# Patient Record
Sex: Female | Born: 1935 | Race: White | Hispanic: No | Smoking: Never smoker
Health system: Southern US, Community
[De-identification: ages and names within clinical notes are randomized; demographics above are authoritative.]

## PROBLEM LIST (undated history)

## (undated) DIAGNOSIS — I4891 Unspecified atrial fibrillation: Secondary | ICD-10-CM

## (undated) DIAGNOSIS — E079 Disorder of thyroid, unspecified: Secondary | ICD-10-CM

## (undated) DIAGNOSIS — E785 Hyperlipidemia, unspecified: Secondary | ICD-10-CM

## (undated) DIAGNOSIS — R1013 Epigastric pain: Secondary | ICD-10-CM

## (undated) HISTORY — PX: JOINT REPLACEMENT: SHX530

## (undated) HISTORY — PX: BREAST SURGERY: SHX581

## (undated) HISTORY — DX: Unspecified atrial fibrillation: I48.91

## (undated) HISTORY — DX: Disorder of thyroid, unspecified: E07.9

## (undated) HISTORY — PX: SPINE SURGERY: SHX786

## (undated) HISTORY — DX: Hyperlipidemia, unspecified: E78.5

## (undated) HISTORY — PX: ABDOMINAL HYSTERECTOMY: SHX81

---

## 2011-03-29 ENCOUNTER — Inpatient Hospital Stay
Admit: 2011-03-29 | Discharge: 2011-03-29 | Disposition: A | Discharge status: Home / Self Care | Attending: Emergency Medicine

## 2011-03-29 LAB — STREP SCREEN GROUP A THROAT: Rapid Strep A Screen: NEGATIVE

## 2011-03-29 MED ORDER — HYDROCODONE-ACETAMINOPHEN 5-325 MG PO TABS
5-325 MG | ORAL_TABLET | Freq: Four times a day (QID) | ORAL | Status: AC | PRN
Start: 2011-03-29 — End: 2011-04-05

## 2011-03-29 MED ORDER — AZITHROMYCIN 250 MG PO TABS
250 MG | PACK | ORAL | Status: AC
Start: 2011-03-29 — End: 2011-04-08

## 2011-03-29 MED ADMIN — HYDROcodone-acetaminophen (NORCO) 5-325 MG per tablet 1 tablet: 1 | ORAL | @ 17:00:00 | NDC 00406036562

## 2011-03-29 MED FILL — HYDROCODONE-ACETAMINOPHEN 5-325 MG PO TABS: 5-325 MG | ORAL | Qty: 1

## 2011-03-29 NOTE — Discharge Instructions (Signed)
Sore Throat  Sore throats may be caused by bacteria and viruses. They may also be caused by:   Smoking.    Pollution.    Allergies.   If a sore throat is due to Strep infection (a bacterial infection), you may need:   A throat swab and/or    A culture test to verify the strep infection.   You will need one of these:   An antibiotic shot.    Oral medicine for a full 10 days to cure it.   Strep infection is very contagious. A doctor should check any close contacts who have a sore throat or fever. A sore throat caused by a virus infection will usually last only 3-4 days. Antibiotics will not treat a viral sore throat.   Infectious mononucleosis (a viral disease), however, can cause a sore throat that last for up to 3 weeks. Mononucleosis can be diagnosed with blood tests. You must have been sick for at least 1 week for the test to give accurate results.  HOME CARE INSTRUCTIONS   To treat a sore throat, take mild pain medicine.    Increase your fluids.    Eat a soft diet.    Do not smoke.    Gargling with warm water or salt water (1 tsp. salt in 8 oz. water) can be helpful.    Try throat sprays or lozenges or sucking on hard candy will also ease the symptoms.   Call your doctor if your sore throat lasts longer than 1 week.   SEEK IMMEDIATE MEDICAL CARE IF YOU HAVE:    Breathing difficulty.    Increased swelling in the throat.    Pain so severe you are unable to swallow fluids or your saliva.    A severe headache, high fever, vomiting, or red rash.   Document Released: 05/20/2004 Document Re-Released: 07/09/2008  [employer] Patient Information 2012 [employer].

## 2011-03-29 NOTE — ED Notes (Signed)
Ambulatory to exit gait steady without assistance.  Discharged to home. Follow up instructions and home going medications reviewed, with patient . Patient verbalizes understanding.  Discussed driving restrictions, while taking narcotics. Patient verbalizes understanding.     Bertram Millard, RN  03/29/11 1153

## 2011-03-29 NOTE — ED Provider Notes (Addendum)
HPI:  03/29/2011,   Time: 11:34 AM         [name] is a 77 y.o. female presenting to the ED for Sore throat, beginning 2 days ago.  The complaint has been persistent, moderate in severity, and worsened by nothing.  Patient has been visiting her family From [place]. For the last 2 days been having some sore throat, cough, ear fullness,. She has no fever no chills no neck pain. She denies any shortness of breath palpitations or dizziness. Patient states the sore throat seems to be persistent that it makes it worse when she swallows. She denies any sick contacts. Patient is able to tolerate by mouth slight discomfort with swallowing. No change in bladder Or bowel habits.    ROS:   Unless otherwise stated in this report or unable to obtain because of the patient's clinical or mental status as evidenced by the medical record, this patients's positive and negative responses for Review of Systems, constitutional, psych, eyes, ENT, cardiovascular, respiratory, gastrointestinal, neurological, genitourinary, musculoskeletal, integument systems and systems related to the presenting problem are either stated in the preceding or were not pertinent or were negative for the symptoms and/or complaints related to the medical problem.      --------------------------------------------- PAST HISTORY ---------------------------------------------  Past Medical History:  has a past medical history of Hyperlipidemia; Thyroid disease; Asthma; and COPD (chronic obstructive pulmonary disease).    Past Surgical History:  has past surgical history that includes back surgery; Breast surgery; and joint replacement.    Social History:  reports that she has quit smoking. She does not have any smokeless tobacco history on file. She reports that she does not drink alcohol or use illicit drugs.    Family History: family history is not on file.     The patient's home medications have been reviewed.    Allergies: Aspirin; Eggs or egg-derived  products; and Sulfa drugs    -------------------------------------------------- RESULTS -------------------------------------------------  All laboratory and radiology results have been personally reviewed by myself   LABS:  Results for orders placed during the hospital encounter of 03/29/11   STREP SCREEN GROUP A THROAT       Component Value Range    Rapid Strep A Screen NEGATIVE         RADIOLOGY:  Interpreted by Radiologist.       ------------------------- NURSING NOTES AND VITALS REVIEWED ---------------------------   The nursing notes within the ED encounter and vital signs as below have been reviewed.   BP 116/60  Pulse 95  Temp(Src) 98.6 F (37 C) (Oral)  Resp 14  Ht 5\' 4"  (1.626 m)  Wt 180 lb (81.647 kg)  BMI 30.90 kg/m2  SpO2 97%  Oxygen Saturation Interpretation: Normal      ---------------------------------------------------PHYSICAL EXAM--------------------------------------      Constitutional/General: Alert and oriented x3, well appearing, non toxic in NAD  Head: NC/AT  Eyes: PERRL, EOMI  Mouth: Oropharynx With injection, handling secretions, no trismus.  No tonsil adenopathy or exudates.  Neck: Supple, full ROM, no meningeal signs. Mild anterior lymphadenopathy.  Pulmonary: Lungs clear to auscultation bilaterally, no wheezes, rales, or rhonchi. Not in respiratory distress  Cardiovascular:  Regular rate and rhythm, no murmurs, gallops, or rubs. 2+ distal pulses  Abdomen: Soft, non tender, non distended,   Extremities: Moves all extremities x 4. Warm and well perfused  Skin: warm and dry without rash  Neurologic: GCS 15,  Psych: Normal Affect      ------------------------------ ED COURSE/MEDICAL DECISION MAKING----------------------  Medications   simvastatin (ZOCOR) 20 MG tablet (not administered)   levothyroxine (SYNTHROID) 50 MCG tablet (not administered)   citalopram (CELEXA) 20 MG tablet (not administered)   montelukast (SINGULAIR) 10 MG tablet (not administered)   raloxifene (EVISTA) 60  MG tablet (not administered)   pregabalin (LYRICA) 150 MG capsule (not administered)   digoxin (LANOXIN) 0.125 MG tablet (not administered)   dabigatran (PRADAXA) 150 MG capsule (not administered)   ipratropium-albuterol (DUONEB) 0.5-2.5 (3) MG/3ML SOLN nebulizer solution (not administered)   omeprazole (PRILOSEC) 20 MG capsule (not administered)   azithromycin (ZITHROMAX) 250 MG tablet (not administered)   HYDROcodone-acetaminophen (NORCO) 5-325 MG per tablet (not administered)   HYDROcodone-acetaminophen (NORCO) 5-325 MG per tablet 1 tablet (not administered)         Medical Decision Making:    Patient presents with nonspecific throat pain may be secondary to laryngitis or pharyngitis. The patient has had some intermittent cough that may be secondary to reactive airway disease. Chest no signs and symptoms for possible respiratory distress. Patient discharged home with antibiotics and pain control for sore throat.    Counseling:   The emergency provider has spoken with the patient and discussed today's results, in addition to providing specific details for the plan of care and counseling regarding the diagnosis and prognosis.  Questions are answered at this time and they are agreeable with the plan.      --------------------------------- IMPRESSION AND DISPOSITION ---------------------------------    IMPRESSION  1. Acute pharyngitis        DISPOSITION  Disposition: Discharge to home  Patient condition is good      Shelbie Hutching, MD  03/29/11 1141    Shelbie Hutching, MD  03/29/11 1723

## 2013-12-18 ENCOUNTER — Encounter: Payer: Self-pay | Admitting: *Deleted

## 2013-12-19 ENCOUNTER — Encounter: Payer: Self-pay | Admitting: Adult Health

## 2013-12-19 ENCOUNTER — Non-Acute Institutional Stay (SKILLED_NURSING_FACILITY): Payer: Medicare Other | Admitting: Adult Health

## 2013-12-19 DIAGNOSIS — R531 Weakness: Secondary | ICD-10-CM

## 2013-12-19 DIAGNOSIS — F329 Major depressive disorder, single episode, unspecified: Secondary | ICD-10-CM

## 2013-12-19 DIAGNOSIS — F3289 Other specified depressive episodes: Secondary | ICD-10-CM

## 2013-12-19 DIAGNOSIS — E039 Hypothyroidism, unspecified: Secondary | ICD-10-CM | POA: Insufficient documentation

## 2013-12-19 DIAGNOSIS — J309 Allergic rhinitis, unspecified: Secondary | ICD-10-CM

## 2013-12-19 DIAGNOSIS — E785 Hyperlipidemia, unspecified: Secondary | ICD-10-CM

## 2013-12-19 DIAGNOSIS — G629 Polyneuropathy, unspecified: Secondary | ICD-10-CM

## 2013-12-19 DIAGNOSIS — I4891 Unspecified atrial fibrillation: Secondary | ICD-10-CM | POA: Insufficient documentation

## 2013-12-19 DIAGNOSIS — R5383 Other fatigue: Secondary | ICD-10-CM

## 2013-12-19 DIAGNOSIS — R5381 Other malaise: Secondary | ICD-10-CM

## 2013-12-19 DIAGNOSIS — F32A Depression, unspecified: Secondary | ICD-10-CM | POA: Insufficient documentation

## 2013-12-19 DIAGNOSIS — G589 Mononeuropathy, unspecified: Secondary | ICD-10-CM

## 2013-12-19 NOTE — Progress Notes (Signed)
Patient ID: Emily Kaiser, female   DOB: 22-Sep-1927, 80 y.o.   MRN: [id number]               PROGRESS NOTE  DATE: 12/19/2013  FACILITY: Nursing Home Location: [facility]  LEVEL OF CARE: SNF (31)  Acute Visit  CHIEF COMPLAINT:  Follow-up Hospitalization  HISTORY OF PRESENT ILLNESS: This is an 80 year old female who has been admitted to [facility] on 12/18/13 from [facility] with Generalized Weakness. She has been admitted for a short-term rehabilitation.  REASSESSMENT OF ONGOING PROBLEM(S):  ATRIAL FIBRILLATION: the patients atrial fibrillation remains stable.  The patient denies DOE, tachycardia, orthopnea, transient neurological sx, pedal edema, palpitations, & PNDs.  No complications noted from the medications currently being used.  DEPRESSION: The depression remains stable. Patient denies ongoing feelings of sadness, insomnia, anedhonia or lack of appetite. No complications reported from the medications currently being used. Staff do not report behavioral problems.  HYPOTHYROIDISM: The hypothyroidism remains stable. No complications noted from the medications presently being used.  The patient denies fatigue or constipation.  8/15 TSH 2.666   PAST MEDICAL HISTORY : Reviewed.  No changes/see problem list  CURRENT MEDICATIONS: Reviewed per MAR/see medication list  REVIEW OF SYSTEMS:  GENERAL: no change in appetite, no fatigue, no weight changes, no fever, chills or weakness RESPIRATORY: no cough, SOB, DOE, wheezing, hemoptysis CARDIAC: no chest pain, edema or palpitations GI: no abdominal pain, diarrhea, constipation, heart burn, nausea or vomiting  PHYSICAL EXAMINATION  GENERAL: no acute distress, normal body habitus EYES: conjunctivae normal, sclerae normal, normal eye lids NECK: supple, trachea midline, no neck masses, no thyroid tenderness, no thyromegaly LYMPHATICS: no LAN in the neck, no supraclavicular LAN RESPIRATORY: breathing  is even & unlabored, BS CTAB CARDIAC: Irregular in rhythm and regular in rate, no murmur,no extra heart sounds, no edema GI: abdomen soft, normal BS, no masses, no tenderness, no hepatomegaly, no splenomegaly EXTREMITIES:  Able to move X4 extremities PSYCHIATRIC: the patient is alert & oriented to person, affect & behavior appropriate  LABS/RADIOLOGY: 12/14/13  sodium 139 potassium 4.4 glucose 111 BUN 13 creatinine 1.04 calcium 9.8 albumin 3.7 SGOT 51 SGPT 37 alkaline phosphatase 79 total bilirubin 1.1 anion gap 14.0 TSH 2.666 WBC 4.9 hemoglobin 15.6 hematocrit 44.7 MCV 93.7 MRI-brain with and without contrast - atrophy and chronic ischemia in the white matter and pons. No acute abnormality   ASSESSMENT/PLAN:  Generalized weakness - for rehabitation Atrial fibrillation - rate controlled; continue pradaxa and digoxin Hyperlipidemia - continue simvastatin Neuropathy - stable; continue Lyrica Depression - continue Celexa Allergic rhinitis - continue fexofenadine and Singulair Hypothyroidism - stable; continue Synthroid   CPT CODE: 65784  Ella Bodo - NP [facility] [phone]

## 2013-12-20 ENCOUNTER — Non-Acute Institutional Stay (SKILLED_NURSING_FACILITY): Payer: Medicare Other | Admitting: Internal Medicine

## 2013-12-20 DIAGNOSIS — F329 Major depressive disorder, single episode, unspecified: Secondary | ICD-10-CM

## 2013-12-20 DIAGNOSIS — G629 Polyneuropathy, unspecified: Secondary | ICD-10-CM

## 2013-12-20 DIAGNOSIS — G589 Mononeuropathy, unspecified: Secondary | ICD-10-CM

## 2013-12-20 DIAGNOSIS — F32A Depression, unspecified: Secondary | ICD-10-CM

## 2013-12-20 DIAGNOSIS — I4891 Unspecified atrial fibrillation: Secondary | ICD-10-CM

## 2013-12-20 DIAGNOSIS — F3289 Other specified depressive episodes: Secondary | ICD-10-CM

## 2013-12-20 DIAGNOSIS — E039 Hypothyroidism, unspecified: Secondary | ICD-10-CM

## 2013-12-22 NOTE — Progress Notes (Signed)
        HISTORY & PHYSICAL  DATE: 12/20/2013   FACILITY: [facility]  LEVEL OF CARE: SNF (31)  ALLERGIES:  Allergies  Allergen Reactions  . Asa [Aspirin]   . Eggs Or Egg-Derived Products   . Sulfa Antibiotics     CHIEF COMPLAINT:  Manage atrial fibrillation, hypothyroidism & peripheral neuropathy  HISTORY OF PRESENT ILLNESS: 80 y/o Caucasian female was hospitalized secondary to generalized weakness.  After hospitalization she is admitted to this facility for short term rehabilitation.  ATRIAL FIBRILLATION: the patients atrial fibrillation remains stable.  The patient denies DOE, tachycardia, orthopnea, transient neurological sx, pedal edema, palpitations, & PNDs.  No complications noted from the medications currently being used.  HYPOTHYROIDISM: The hypothyroidism remains stable. No complications noted from the medications presently being used.  The patient denies fatigue or constipation.  Last TSH 2.666 in 8-15.  PERIPHERAL NEUROPATHY: The peripheral neuropathy is stable. The patient denies pain in the feet, tingling, and numbness. No complications noted from the medication presently being used.  PAST MEDICAL HISTORY :  Past Medical History  Diagnosis Date  . Thyroid disease     Hypothyroidism  . Atrial fibrillation   . Dyslipidemia     PAST SURGICAL HISTORY: Past Surgical History  Procedure Laterality Date  . Abdominal hysterectomy    . Spine surgery    . Joint replacement    . Breast surgery      SOCIAL HISTORY:  reports that she has never smoked. She does not have any smokeless tobacco history on file. She reports that she does not drink alcohol or use illicit drugs.  FAMILY HISTORY: none  CURRENT MEDICATIONS: Reviewed per MAR/see medication list  REVIEW OF SYSTEMS:  See HPI otherwise 14 point ROS is negative.  PHYSICAL EXAMINATION  VS:  See VS section  GENERAL: no acute distress, moderately obese body habitus EYES: conjunctivae  normal, sclerae normal, normal eye lids MOUTH/THROAT: lips without lesions,no lesions in the mouth,tongue is without lesions,uvula elevates in midline NECK: supple, trachea midline, no neck masses, no thyroid tenderness, no thyromegaly LYMPHATICS: no LAN in the neck, no supraclavicular LAN RESPIRATORY: breathing is even & unlabored, BS CTAB CARDIAC: heart rate is irregularly irregular, no murmur,no extra heart sounds, no edema GI:  ABDOMEN: abdomen soft, normal BS, no masses, no tenderness  LIVER/SPLEEN: no hepatomegaly, no splenomegaly MUSCULOSKELETAL: HEAD: normal to inspection  EXTREMITIES: LEFT UPPER EXTREMITY: full range of motion, normal strength & tone RIGHT UPPER EXTREMITY:  full range of motion, normal strength & tone LEFT LOWER EXTREMITY: moderate range of motion, normal strength & tone RIGHT LOWER EXTREMITY: moderate range of motion, normal strength & tone PSYCHIATRIC: the patient is alert & oriented to person, affect & behavior appropriate  LABS/RADIOLOGY:  12-16-13 glc 101 ow bmp nl, wbc 15.2 ow cbc nl 12-15-13 glc 163, alb 3.4, AST 43 ow cmp nl, P 3.3, Mg 2.1 12-14-13 MRI of the brain-no acute findings, CT of the chest-no acute findings, UA negative, blood cx-negative, CXR-negative, vit B12 level 602, digoxin level < 0.3   ASSESSMENT/PLAN:  Atrial fibrillation-rate controlled Hypothyroidism-well controlled Neuropathy-continue lyrica Depression-continue celexa Hyperlipidemia-continue simvastatin  I have reviewed patient's medical records received at admission/from hospitalization.  CPT CODE: 16109  Angela Cox, MD [facility] [phone]

## 2014-01-01 ENCOUNTER — Non-Acute Institutional Stay (SKILLED_NURSING_FACILITY): Payer: Medicare Other | Admitting: Adult Health

## 2014-01-01 ENCOUNTER — Encounter: Payer: Self-pay | Admitting: Adult Health

## 2014-01-01 DIAGNOSIS — G589 Mononeuropathy, unspecified: Secondary | ICD-10-CM

## 2014-01-01 DIAGNOSIS — J309 Allergic rhinitis, unspecified: Secondary | ICD-10-CM

## 2014-01-01 DIAGNOSIS — E039 Hypothyroidism, unspecified: Secondary | ICD-10-CM

## 2014-01-01 DIAGNOSIS — G629 Polyneuropathy, unspecified: Secondary | ICD-10-CM

## 2014-01-01 DIAGNOSIS — E785 Hyperlipidemia, unspecified: Secondary | ICD-10-CM

## 2014-01-01 DIAGNOSIS — F329 Major depressive disorder, single episode, unspecified: Secondary | ICD-10-CM

## 2014-01-01 DIAGNOSIS — F32A Depression, unspecified: Secondary | ICD-10-CM

## 2014-01-01 DIAGNOSIS — F3289 Other specified depressive episodes: Secondary | ICD-10-CM

## 2014-01-01 DIAGNOSIS — I4891 Unspecified atrial fibrillation: Secondary | ICD-10-CM

## 2014-01-01 NOTE — Progress Notes (Signed)
Patient ID: Emily Kaiser, female   DOB: Apr 01, 1928, 80 y.o.   MRN: [id number]              PROGRESS NOTE  DATE:     01/01/14  FACILITY: Nursing Home Location: [facility]  LEVEL OF CARE: SNF (31)  Acute Visit  CHIEF COMPLAINT:  Discharge Notes  HISTORY OF PRESENT ILLNESS: This is an 80 year old female who is for discharge home. She has been admitted to [facility] on 12/18/13 from [facility] with Generalized Weakness. Patient was admitted to this facility for short-term rehabilitation after the patient's recent hospitalization.  Patient has completed SNF rehabilitation and therapy has cleared the patient for discharge.  REASSESSMENT OF ONGOING PROBLEM(S):  PERIPHERAL NEUROPATHY: The peripheral neuropathy is stable. The patient denies pain in the feet, tingling, and numbness. No complications noted from the medication presently being used.  ALLERGIC RHINITIS: Allergic rhinitis remains stable.  Patient denies ongoing symptoms such as runny nose sneezing or tearing. No complications reported from the current medication(s) being used.  ATRIAL FIBRILLATION: the patients atrial fibrillation remains stable.  The patient denies DOE, tachycardia, orthopnea, transient neurological sx, pedal edema, palpitations, & PNDs.  No complications noted from the medications currently being used.  PAST MEDICAL HISTORY : Reviewed.  No changes/see problem list  CURRENT MEDICATIONS: Reviewed per MAR/see medication list  REVIEW OF SYSTEMS:  GENERAL: no change in appetite, no fatigue, no weight changes, no fever, chills or weakness RESPIRATORY: no cough, SOB, DOE, wheezing, hemoptysis CARDIAC: no chest pain, edema or palpitations GI: no abdominal pain, diarrhea, constipation, heart burn, nausea or vomiting  PHYSICAL EXAMINATION  GENERAL: no acute distress, normal body habitus NECK: supple, trachea midline, no neck masses, no thyroid tenderness, no thyromegaly LYMPHATICS:  no LAN in the neck, no supraclavicular LAN RESPIRATORY: breathing is even & unlabored, BS CTAB CARDIAC: Irregular in rhythm and regular in rate, no murmur,no extra heart sounds, no edema GI: abdomen soft, normal BS, no masses, no tenderness, no hepatomegaly, no splenomegaly EXTREMITIES:  Able to move X4 extremities PSYCHIATRIC: the patient is alert & oriented to person, affect & behavior appropriate  LABS/RADIOLOGY: 12/14/13  sodium 139 potassium 4.4 glucose 111 BUN 13 creatinine 1.04 calcium 9.8 albumin 3.7 SGOT 51 SGPT 37 alkaline phosphatase 79 total bilirubin 1.1 anion gap 14.0 TSH 2.666 WBC 4.9 hemoglobin 15.6 hematocrit 44.7 MCV 93.7 MRI-brain with and without contrast - atrophy and chronic ischemia in the white matter and pons. No acute abnormality   ASSESSMENT/PLAN:  Atrial fibrillation - rate controlled; continue pradaxa and digoxin Hyperlipidemia - continue simvastatin Neuropathy - stable; continue Lyrica Depression - continue Celexa Allergic rhinitis - continue fexofenadine and Singulair Hypothyroidism - stable; continue Synthroid  I have filled out patient's discharge paperwork and written prescriptions.   Total discharge time: Less than 30 minutes  Discharge time involved coordination of the discharge process with Child psychotherapist, nursing staff and therapy department.    CPT CODE: 40981  Ella Bodo - NP [facility] [phone]

## 2014-03-27 ENCOUNTER — Encounter (HOSPITAL_COMMUNITY): Payer: Self-pay | Admitting: Emergency Medicine

## 2014-03-27 ENCOUNTER — Emergency Department (HOSPITAL_COMMUNITY): Payer: PRIVATE HEALTH INSURANCE

## 2014-03-27 ENCOUNTER — Emergency Department (HOSPITAL_COMMUNITY)
Admission: EM | Admit: 2014-03-27 | Discharge: 2014-03-27 | Disposition: A | Discharge status: Home / Self Care | Payer: PRIVATE HEALTH INSURANCE | Attending: Emergency Medicine | Admitting: Emergency Medicine

## 2014-03-27 DIAGNOSIS — E785 Hyperlipidemia, unspecified: Secondary | ICD-10-CM | POA: Diagnosis not present

## 2014-03-27 DIAGNOSIS — T148XXA Other injury of unspecified body region, initial encounter: Secondary | ICD-10-CM

## 2014-03-27 DIAGNOSIS — W01198A Fall on same level from slipping, tripping and stumbling with subsequent striking against other object, initial encounter: Secondary | ICD-10-CM | POA: Insufficient documentation

## 2014-03-27 DIAGNOSIS — S8001XA Contusion of right knee, initial encounter: Secondary | ICD-10-CM | POA: Diagnosis not present

## 2014-03-27 DIAGNOSIS — Y9389 Activity, other specified: Secondary | ICD-10-CM | POA: Insufficient documentation

## 2014-03-27 DIAGNOSIS — W19XXXA Unspecified fall, initial encounter: Secondary | ICD-10-CM

## 2014-03-27 DIAGNOSIS — S0990XA Unspecified injury of head, initial encounter: Secondary | ICD-10-CM | POA: Diagnosis present

## 2014-03-27 DIAGNOSIS — I4891 Unspecified atrial fibrillation: Secondary | ICD-10-CM | POA: Insufficient documentation

## 2014-03-27 DIAGNOSIS — E079 Disorder of thyroid, unspecified: Secondary | ICD-10-CM | POA: Insufficient documentation

## 2014-03-27 DIAGNOSIS — Y998 Other external cause status: Secondary | ICD-10-CM | POA: Diagnosis not present

## 2014-03-27 DIAGNOSIS — S0083XA Contusion of other part of head, initial encounter: Secondary | ICD-10-CM | POA: Diagnosis not present

## 2014-03-27 DIAGNOSIS — Z79899 Other long term (current) drug therapy: Secondary | ICD-10-CM | POA: Insufficient documentation

## 2014-03-27 DIAGNOSIS — S5002XA Contusion of left elbow, initial encounter: Secondary | ICD-10-CM | POA: Insufficient documentation

## 2014-03-27 DIAGNOSIS — Y9289 Other specified places as the place of occurrence of the external cause: Secondary | ICD-10-CM | POA: Insufficient documentation

## 2014-03-27 MED ORDER — ACETAMINOPHEN 500 MG PO TABS
1000.0000 mg | ORAL_TABLET | Freq: Once | ORAL | Status: AC
  Administered 2014-03-27: 1000 mg via ORAL
  Filled 2014-03-27: qty 2

## 2014-03-27 NOTE — ED Notes (Signed)
Patient returned from X-ray 

## 2014-03-27 NOTE — Discharge Instructions (Signed)

## 2014-03-27 NOTE — ED Notes (Signed)
Per pt sts tripped and fell and hit head and arm on table. Pt has hematoma to right forehead. No bleeding. Denies blood thinners. Denies neck pain.

## 2014-03-27 NOTE — ED Provider Notes (Signed)
CSN: [id number]     Arrival date & time 03/27/14  1239 History   First MD Initiated Contact with Patient 03/27/14 1249     Chief Complaint  Patient presents with  . Fall  . Head Injury     (Consider location/radiation/quality/duration/timing/severity/associated sxs/prior Treatment) Patient is a 81 y.o. female presenting with fall and head injury. The history is provided by the patient.  Fall This is a new problem. The current episode started less than 1 hour ago. Episode frequency: once. The problem has not changed since onset.Pertinent negatives include no chest pain, no abdominal pain and no shortness of breath. Nothing aggravates the symptoms. Nothing relieves the symptoms. She has tried nothing for the symptoms.  Head Injury Associated symptoms: no vomiting     Past Medical History  Diagnosis Date  . Thyroid disease     Hypothyroidism  . Atrial fibrillation   . Dyslipidemia    Past Surgical History  Procedure Laterality Date  . Abdominal hysterectomy    . Spine surgery    . Joint replacement    . Breast surgery     History reviewed. No pertinent family history. History  Substance Use Topics  . Smoking status: Never Smoker   . Smokeless tobacco: Not on file  . Alcohol Use: No   OB History    No data available     Review of Systems  Constitutional: Negative for fever.  Respiratory: Negative for cough and shortness of breath.   Cardiovascular: Negative for chest pain.  Gastrointestinal: Negative for vomiting and abdominal pain.  All other systems reviewed and are negative.     Allergies  Asa; Eggs or egg-derived products; and Sulfa antibiotics  Home Medications   Prior to Admission medications   Medication Sig Start Date End Date Taking? Authorizing Provider  albuterol (PROVENTIL HFA) 108 (90 BASE) MCG/ACT inhaler Inhale 2 puffs into the lungs every 6 (six) hours as needed for wheezing or shortness of breath.    Historical Provider, MD  albuterol  (PROVENTIL) (2.5 MG/3ML) 0.083% nebulizer solution Inhale 3 mLs into the lungs every 6 (six) hours as needed for wheezing or shortness of breath.    Historical Provider, MD  citalopram (CELEXA) 20 MG tablet Take 20 mg by mouth daily.    Historical Provider, MD  dabigatran (PRADAXA) 150 MG CAPS capsule Take 150 mg by mouth 2 (two) times daily.    Historical Provider, MD  digoxin (LANOXIN) 0.125 MG tablet Take 0.125 mg by mouth daily.    Historical Provider, MD  fexofenadine (ALLEGRA) 180 MG tablet Take 180 mg by mouth daily.    Historical Provider, MD  levothyroxine (SYNTHROID, LEVOTHROID) 50 MCG tablet Take 50 mcg by mouth daily before breakfast.    Historical Provider, MD  montelukast (SINGULAIR) 10 MG tablet Take 10 mg by mouth at bedtime.    Historical Provider, MD  Multiple Vitamins-Minerals (MULTIVITAMIN WITH MINERALS) tablet Take 1 tablet by mouth daily.    Historical Provider, MD  pregabalin (LYRICA) 150 MG capsule Take 150 mg by mouth 2 (two) times daily.    Historical Provider, MD  raloxifene (EVISTA) 60 MG tablet Take 60 mg by mouth daily.    Historical Provider, MD  simvastatin (ZOCOR) 20 MG tablet Take 20 mg by mouth daily at 6 PM.    Historical Provider, MD   BP 100/59 mmHg  Pulse 69  Temp(Src) 97.4 F (36.3 C) (Oral)  Resp 14  SpO2 98% Physical Exam  Constitutional: She is oriented to  person, place, and time. She appears well-developed and well-nourished. No distress.  HENT:  Head: Normocephalic.    Mouth/Throat: Oropharynx is clear and moist.  Eyes: EOM are normal. Pupils are equal, round, and reactive to light.  Neck: Normal range of motion. Neck supple.  Cardiovascular: Normal rate and regular rhythm.  Exam reveals no friction rub.   No murmur heard. Pulmonary/Chest: Effort normal and breath sounds normal. No respiratory distress. She has no wheezes. She has no rales.  Abdominal: Soft. She exhibits no distension. There is no tenderness. There is no rebound.    Musculoskeletal: Normal range of motion. She exhibits no edema.       Arms:      Legs: Neurological: She is alert and oriented to person, place, and time.  Skin: She is not diaphoretic.  Nursing note and vitals reviewed.   ED Course  Procedures (including critical care time) Labs Review Labs Reviewed - No data to display  Imaging Review Ct Head Wo Contrast  03/27/2014   CLINICAL DATA:  Head injury from fall. Right frontal hematoma. Initial encounter.  EXAM: CT HEAD WITHOUT CONTRAST  TECHNIQUE: Contiguous axial images were obtained from the base of the skull through the vertex without intravenous contrast.  COMPARISON:  MRI brain 12/14/2013.  FINDINGS: There is no evidence of acute intracranial hemorrhage, mass lesion, brain edema or extra-axial fluid collection. The ventricles and subarachnoid spaces are diffusely prominent but stable. There is no CT evidence of acute cortical infarction. There is extensive chronic periventricular white matter disease which appears unchanged. There is also extensive involvement of the pons which appears symmetric and stable. Intracranial vascular calcifications are noted.  The visualized paranasal sinuses, mastoid air cells and middle ears are clear. The calvarium is intact. There is a right frontal scalp soft tissue hematoma.  IMPRESSION: 1. Right frontal scalp soft tissue injury. No evidence of calvarial fracture. 2. No acute intracranial findings. 3. Stable atrophy and chronic small vessel ischemic changes.   Electronically Signed   By: Roxy HorsemanBill  Veazey M.D.   On: 03/27/2014 14:02   Dg Knee Complete 4 Views Right  03/27/2014   CLINICAL DATA:  Slipped and fell yesterday. Injured right knee and left arm.  EXAM: RIGHT KNEE - COMPLETE 4+ VIEW; LEFT HUMERUS - 2+ VIEW  COMPARISON:  None.  FINDINGS: Right knee:  The femoral and tibial components are well seated. No complicating features. No acute fracture. No joint effusion.  Left humerus:  The left shoulder and elbow  joints are grossly maintained. No acute fracture of the humerus is identified.  IMPRESSION: No acute bony findings.   Electronically Signed   By: Loralie ChampagneMark  Gallerani M.D.   On: 03/27/2014 16:25   Dg Humerus Left  03/27/2014   CLINICAL DATA:  Slipped and fell yesterday. Injured right knee and left arm.  EXAM: RIGHT KNEE - COMPLETE 4+ VIEW; LEFT HUMERUS - 2+ VIEW  COMPARISON:  None.  FINDINGS: Right knee:  The femoral and tibial components are well seated. No complicating features. No acute fracture. No joint effusion.  Left humerus:  The left shoulder and elbow joints are grossly maintained. No acute fracture of the humerus is identified.  IMPRESSION: No acute bony findings.   Electronically Signed   By: Loralie ChampagneMark  Gallerani M.D.   On: 03/27/2014 16:25     EKG Interpretation None      MDM   Final diagnoses:  Fall  Hematoma    51F presents s/p fall. Is moving to [place] today and  tripped. Hit her head on a table. Is on Pradaxa. Here cheerful, acting per baseline. Neuro intact. Bruise on forehead, L central humerus, R knee. Will image injured areas.  Imaging ok. Stable for discharge.  I have reviewed all labs and imaging and considered them in my medical decision making.   Elwin MochaBlair Deziyah Arvin, MD 03/27/14 [phone]

## 2015-01-19 ENCOUNTER — Inpatient Hospital Stay
Admit: 2015-01-19 | Discharge: 2015-01-19 | Disposition: A | Discharge status: Home / Self Care | Attending: Emergency Medicine

## 2015-01-19 DIAGNOSIS — R0789 Other chest pain: Secondary | ICD-10-CM

## 2015-01-19 LAB — CBC WITH AUTO DIFFERENTIAL
Basophils %: 1 % (ref 0–2)
Basophils Absolute: 0.05 E9/L (ref 0.00–0.20)
Eosinophils %: 2 % (ref 0–6)
Eosinophils Absolute: 0.13 E9/L (ref 0.05–0.50)
Hematocrit: 32.2 % — ABNORMAL LOW (ref 34.0–48.0)
Hemoglobin: 10.9 g/dL — ABNORMAL LOW (ref 11.5–15.5)
Lymphocytes %: 35 % (ref 20–42)
Lymphocytes Absolute: 1.95 E9/L (ref 1.50–4.00)
MCH: 31.4 pg (ref 26.0–35.0)
MCHC: 33.7 % (ref 32.0–34.5)
MCV: 93.3 fL (ref 80.0–99.9)
MPV: 9.1 fL (ref 7.0–12.0)
Monocytes %: 14 % — ABNORMAL HIGH (ref 2–12)
Monocytes Absolute: 0.8 E9/L (ref 0.10–0.95)
Neutrophils %: 47 % (ref 43–80)
Neutrophils Absolute: 2.61 E9/L (ref 1.80–7.30)
Platelets: 166 E9/L (ref 130–450)
RBC: 3.46 E12/L — ABNORMAL LOW (ref 3.50–5.50)
RDW: 14.8 fL (ref 11.5–15.0)
WBC: 5.5 E9/L (ref 4.5–11.5)

## 2015-01-19 LAB — BRAIN NATRIURETIC PEPTIDE: Pro-BNP: 435 pg/mL (ref 0–450)

## 2015-01-19 LAB — D-DIMER, QUANTITATIVE: D-Dimer, Quant: 218 ng/mL DDU

## 2015-01-19 LAB — EKG 12-LEAD
Atrial Rate: 267 {beats}/min
Q-T Interval: 388 ms
QRS Duration: 70 ms
QTc Calculation (Bazett): 461 ms
R Axis: -13 degrees
T Axis: 68 degrees
Ventricular Rate: 85 {beats}/min

## 2015-01-19 LAB — TROPONIN: Troponin: <0.01 ng/mL (ref 0.00–0.03)

## 2015-01-19 LAB — BASIC METABOLIC PANEL
Anion Gap: 11 mmol/L (ref 7–16)
BUN: 13 mg/dL (ref 8–23)
CO2: 24 mmol/L (ref 22–29)
Calcium: 9 mg/dL (ref 8.6–10.2)
Chloride: 108 mmol/L — ABNORMAL HIGH (ref 98–107)
Creatinine: 1.2 mg/dL — ABNORMAL HIGH (ref 0.5–1.0)
GFR African American: 52
GFR Non-African American: 43 mL/min/{1.73_m2} (ref >=60)
Glucose: 95 mg/dL (ref 74–109)
Potassium: 4.1 mmol/L (ref 3.5–5.0)
Sodium: 143 mmol/L (ref 132–146)

## 2015-01-19 LAB — PROTIME-INR
INR: 1.7
Protime: 18.3 s — ABNORMAL HIGH (ref 9.3–12.4)

## 2015-01-19 LAB — APTT: aPTT: 60.2 s — ABNORMAL HIGH (ref 24.5–35.1)

## 2015-01-19 MED ORDER — GI COCKTAIL
Freq: Once | Status: AC
Start: 2015-01-19 — End: 2015-01-19
  Administered 2015-01-19: 21:00:00 30 mL via ORAL

## 2015-01-19 MED ORDER — LIDOCAINE VISCOUS 2 % MT SOLN
2 % | Freq: Once | OROMUCOSAL | Status: DC
Start: 2015-01-19 — End: 2015-01-19

## 2015-01-19 MED ORDER — ONDANSETRON HCL 4 MG/2ML IJ SOLN
4 MG/2ML | Freq: Once | INTRAMUSCULAR | Status: AC
Start: 2015-01-19 — End: 2015-01-19
  Administered 2015-01-19: 21:00:00 4 mg via INTRAVENOUS

## 2015-01-19 MED ORDER — MORPHINE SULFATE (PF) 2 MG/ML IV SOLN
2 MG/ML | Freq: Once | INTRAVENOUS | Status: DC
Start: 2015-01-19 — End: 2015-01-19

## 2015-01-19 MED ORDER — PANTOPRAZOLE SODIUM 40 MG IV SOLR
40 MG | Freq: Once | INTRAVENOUS | Status: AC
Start: 2015-01-19 — End: 2015-01-19
  Administered 2015-01-19: 21:00:00 40 mg via INTRAVENOUS

## 2015-01-19 MED ORDER — OMEPRAZOLE 20 MG PO CPDR
20 MG | ORAL_CAPSULE | Freq: Two times a day (BID) | ORAL | 0 refills | Status: AC
Start: 2015-01-19 — End: 2015-02-02

## 2015-01-19 MED FILL — GI COCKTAIL: Qty: 30

## 2015-01-19 MED FILL — ONDANSETRON HCL 4 MG/2ML IJ SOLN: 4 MG/2ML | INTRAMUSCULAR | Qty: 2

## 2015-01-19 MED FILL — PROTONIX 40 MG IV SOLR: 40 MG | INTRAVENOUS | Qty: 40

## 2015-01-19 NOTE — ED Notes (Signed)
FIRST PROVIDER CONTACT ASSESSMENT NOTE   ??  Department of Emergency Medicine   Admit Date: 01/19/2015  4:39 PM    Chief Complaint: Chest Pain (left midsternal began today.)      History of Present Illness:    [name] is a 81 y.o. female who presents to the ED for chest pain that started PTA. Patient is SOB but states that it is unchanged from her usual.        -----------------END OF FIRST PROVIDER CONTACT ASSESSMENT NOTE--------------  Electronically signed by Casimer Leek, PA   DD: 01/19/15               Casimer Leek, PA  01/19/15 1640

## 2015-01-19 NOTE — ED Provider Notes (Addendum)
??  Department of Emergency Medicine   ED  Provider Note  Admit Date/RoomTime: 01/19/2015  4:39 PM  ED Room: 04/04          History of Present Illness:  01/19/15, Time: 5:14 PM         [name] is a 81 y.o. female presenting to the ED for chest pain, beginning today.  The complaint has been constant, severe in severity, and worsened by nothing.  Patient states mid sternal chest pain began couple hours PTA. She reports she takes Pradaxa 2 times daily for A Fib. She also complains of left sided abdominal pain. Pt has hx of frequent falls. Family states pt is also taking Ibuprofen after getting a tooth pulled recently. Pt denies fever, chills, n/v/d, back pain, extremity pain, sob, ha, dysuria, hematochezia and melena.     Review of Systems:   Pertinent positives and negatives are stated within HPI, all other systems reviewed and are negative.      --------------------------------------------- PAST HISTORY ---------------------------------------------  Past Medical History:  has a past medical history of Asthma; COPD (chronic obstructive pulmonary disease) (HCC); Hyperlipidemia; and Thyroid disease.    Past Surgical History:  has a past surgical history that includes back surgery; Breast surgery; and joint replacement.    Social History:  reports that she has quit smoking. She does not have any smokeless tobacco history on file. She reports that she does not drink alcohol or use illicit drugs.    Family History: family history is not on file.     The patient???s home medications have been reviewed.    Allergies: Aspirin; Eggs or egg-derived products [eggs or egg-derived products]; and Sulfa antibiotics      ---------------------------------------------------PHYSICAL EXAM--------------------------------------    Constitutional/General: Alert and oriented x3, well appearing, non toxic in mild distress  Head: Normocephalic and atraumatic  Eyes: PERRL, EOMI, conjunctiva normal, sclera non icteric  Mouth: Oropharynx  clear, handling secretions, no trismus, no asymmetry of the posterior oropharynx or uvular edema  Neck: Supple, full ROM, non tender to palpation in the midline, no stridor, no crepitus, no meningeal signs  Respiratory: Lungs clear to auscultation bilaterally, no wheezes, rales, or rhonchi. Not in respiratory distress  Cardiovascular:  Regular rate. Regular rhythm. No murmurs, gallops, or rubs. 2+ distal pulses  Chest: No chest wall tenderness  GI:  Abdomen Soft, left sided abdominal tenderness, Non distended.  +BS.   No rebound, guarding, or rigidity. No pulsatile masses.  Musculoskeletal: Moves all extremities x 4. Warm and well perfused, no clubbing, cyanosis, or edema. Capillary refill <3 seconds  Integument: skin warm and dry. No rashes.   Lymphatic: no lymphadenopathy noted  Neurologic: GCS 15, no focal deficits, CN's 2-12 grossly intact.  Psychiatric: Normal Affect      -------------------------------------------------- RESULTS -------------------------------------------------  I have personally reviewed all laboratory and imaging results for this patient. Results are listed below.     LABS:  Results for orders placed or performed during the hospital encounter of 01/19/15   CBC Auto Differential   Result Value Ref Range    WBC 5.5 4.5 - 11.5 E9/L    RBC 3.46 (L) 3.50 - 5.50 E12/L    Hemoglobin 10.9 (L) 11.5 - 15.5 g/dL    Hematocrit 16.1 (L) 34.0 - 48.0 %    MCV 93.3 80.0 - 99.9 fL    MCH 31.4 26.0 - 35.0 pg    MCHC 33.7 32.0 - 34.5 %    RDW 14.8 11.5 -  15.0 fL    Platelets 166 130 - 450 E9/L    MPV 9.1 7.0 - 12.0 fL    Neutrophils Relative 47 43 - 80 %    Lymphocytes Relative 35 20 - 42 %    Monocytes Relative 14 (H) 2 - 12 %    Eosinophils Relative Percent 2 0 - 6 %    Basophils Relative 1 0 - 2 %    Neutrophils Absolute 2.61 1.80 - 7.30 E9/L    Lymphocytes Absolute 1.95 1.50 - 4.00 E9/L    Monocytes Absolute 0.80 0.10 - 0.95 E9/L    Eosinophils Absolute 0.13 0.05 - 0.50 E9/L    Basophils Absolute 0.05 0.00  - 0.20 E9/L   Basic Metabolic Panel   Result Value Ref Range    Sodium 143 132 - 146 mmol/L    Potassium 4.1 3.5 - 5.0 mmol/L    Chloride 108 (H) 98 - 107 mmol/L    CO2 24 22 - 29 mmol/L    Anion Gap 11 7 - 16 mmol/L    Glucose 95 74 - 109 mg/dL    BUN 13 8 - 23 mg/dL    CREATININE 1.2 (H) 0.5 - 1.0 mg/dL    GFR Non-African American 43 >=60 mL/min/1.73    GFR African American 52     Calcium 9.0 8.6 - 10.2 mg/dL   Troponin   Result Value Ref Range    Troponin <0.01 0.00 - 0.03 ng/mL   Protime-INR   Result Value Ref Range    Protime 18.3 (H) 9.3 - 12.4 sec    INR 1.7    APTT   Result Value Ref Range    aPTT 60.2 (H) 24.5 - 35.1 sec   D-Dimer, Quantitative   Result Value Ref Range    D-Dimer, Quant 218 ng/mL DDU   Brain Natriuretic Peptide   Result Value Ref Range    Pro-BNP 435 0 - 450 pg/mL       RADIOLOGY:  Interpreted by Radiologist.  No orders to display         EKG:  This EKG is signed and interpreted by the EP.    Time: 16:45  Rate: 85  Rhythm: Atrial fibrillation w/ controlled Ventricular response.  Interpretation: Low voltage QRS  Comparison: none      ------------------------- NURSING NOTES AND VITALS REVIEWED ---------------------------   The nursing notes within the ED encounter and vital signs as below have been reviewed by myself.  Visit Vitals   ??? BP 123/72   ??? Pulse 75   ??? Temp 98.6 ??F (37 ??C) (Oral)   ??? Resp 16   ??? Ht  (1.676 m)   ??? Wt 167 lb (75.8 kg)   ??? SpO2 93%   ??? BMI 26.95 kg/m2     Oxygen Saturation Interpretation: Normal    The patient???s available past medical records and past encounters were reviewed.        ------------------------------ ED COURSE/MEDICAL DECISION MAKING----------------------  Medications   ondansetron (ZOFRAN) injection 4 mg (4 mg Intravenous Given 01/19/15 1727)   pantoprazole (PROTONIX) injection 40 mg (40 mg Intravenous Given 01/19/15 1727)   gi cocktail 30 mL (30 mLs Oral Given 01/19/15 1726)       Medical Decision Making:   Differential diagnoses:  MI, Pericarditis,  Aortic Dissection, Pneumonia, GERD, to name a few.  CXR showed no medistinal findings to suggest Aortic Dissection.  CXR showed no free air to suggest perforated ulcer.  Pt.  Has been taking per her caregiver Motrin/Advil for pain.  She is improved after having received a GI Cocktail here in the ED.      This patient's ED course included: a personal history and physicial examination  This patient has remained hemodynamically stable during their ED course.    Re-Evaluations:           7:00pm Re-evaluation.  Patient???s symptoms are improving. She states she is feeling better and her pain is not as bad as it was earlier. Pt says she would like to go home.    Consultations:             none    Counseling:  The emergency provider has spoken with the patient and family and discussed today???s results, in addition to providing specific details for the plan of care and counseling regarding the diagnosis and prognosis.  Questions are answered at this time and they are agreeable with the plan.     --------------------------------- IMPRESSION AND DISPOSITION ---------------------------------    IMPRESSION  1. Chest wall pain    2. Gastroesophageal reflux disease with esophagitis        DISPOSITION  Disposition: Discharge to home  Patient condition is good    01/19/15, 5:14 PM.    This note is prepared by Harrie Jeans, acting as Scribe for Delories Heinz, MD.    Delories Heinz, MD:  The scribe's documentation has been prepared under my direction and personally reviewed by me in its entirety.  I confirm that the note above accurately reflects all work, treatment, procedures, and medical decision making performed by me.            Delories Heinz, MD  01/19/15 Norberta Keens       Delories Heinz, MD  01/19/15 [phone]

## 2015-01-19 NOTE — ED Notes (Signed)
Discharge instructions given, medications and follow up instructions reviewed. Patient verbalized understanding, no other noted or stated problems at this time. Patient will follow up with physicians as directed.      Orson Gear, RN  01/19/15 [phone]

## 2015-02-25 NOTE — Progress Notes (Signed)
Spoke with patient's daughter [Carol] because patient is HOH, she states that patient is forgetful but is capable of signing own consents., state patient has a hx of AFib is on Pradaxa, states she used to follow [place] a Cardiologist [place] [place] [address] and [place] to live with daughter she only follows with Dr. Clois ComberGallo for her heart, she states her heart is doing good. Called Dr. Daisy LazarGallo's office and requested to fax any info they haver on this patient.

## 2015-02-25 NOTE — Patient Instructions (Signed)
[facility] PRE-ADMISSION TESTING INSTRUCTIONS    The Preadmission Testing patient is instructed accordingly using the following criteria (check applicable):    ARRIVAL INSTRUCTIONS:  [x]  Parking the day of Surgery is located in the Main Entrance lot.  Upon entering the door, make an immediate right to the surgery reception desk    [x]  [employer] is available Monday through Friday 6 am to 6 pm    [x]  Bring photo ID and insurance card    []  Bring in a copy of Living will or Durable Power of attorney papers.    [x]  Please be sure to arrange transportation to and from the hospital    [x]  Please arrange for someone to be with you the remainder of the day due to having anesthesia      GENERAL INSTRUCTIONS:    [x]  Nothing by mouth after midnight, including gum, candy, mints or water    [x]  You may brush your teeth, but do not swallow any water    [x]  Take medications as instructed with 1-2 oz of water    [x]  Stop herbal supplements and vitamins 5 days prior to procedure    [x]  Follow preop dosing of blood thinners per physician instructions    []  Do not take insulin or oral diabetic medications    []  If diabetic and have low blood sugar or feel symptomatic, take 1-2oz apple juice or glucose tablets    []  Bring inhalers day of surgery    []  Bring C-PAP/ Bi-Pap day of surgery    []  Bring urine specimen day of surgery    [x]   no lotion, powders or creams t    []  Follow bowel prep as instructed per surgeon    []  No smoking within 24 hours of surgery     []  No alcohol or illegal drug use within 24 hours of surgery.    [x]  Jewelry, body piercing's, eyeglasses, contact lenses and dentures are not permitted into surgery (bring cases)      [x]  Please do not wear any nail polish or make up on the day of surgery    [x]  If not already done, you can expect a call from registration    [x]  If surgeon requests a time change you will be notified the day prior to surgery    []  If you receive a  survey after surgery we would greatly appreciate your comments    []  Parent/guardian of a minor must accompany their child and remain on the premises  the entire time they are under our care     []  Pediatric patients may bring favorite toy, blanket or comfort item with them    []  A caregiver or family member must remain with the patient during their stay if they are mentally handicapped, have dementia, disoriented or unable to use a call light or would be a safety concern if left unattended    [x]  Please notify surgeon if you develop any illness between now and time of surgery (cold, cough, sore throat, fever, nausea, vomiting) or any signs of infections  including skin, wounds, and dental.    []  Other instructions    EDUCATIONAL MATERIALS PROVIDED:    []  PAT Preoperative Education Packet/Booklet     []  Medication List    []  Fluoroscopy Information Pamphlet    []  Transfusion bracelet applied with instructions    []  Joint replacement video reviewed    []  Shower with antibacterial soap and use CHG  wipes provided the evening before surgery as instructed

## 2015-02-28 ENCOUNTER — Inpatient Hospital Stay: Admit: 2015-02-28 | Primary: Family Medicine

## 2015-02-28 MED ORDER — SODIUM CHLORIDE 0.9 % IV SOLN
0.9 % | INTRAVENOUS | Status: DC
Start: 2015-02-28 — End: 2015-03-01

## 2015-02-28 MED ORDER — OMEPRAZOLE 40 MG PO CPDR
40 MG | ORAL_CAPSULE | Freq: Every day | ORAL | 3 refills | Status: AC
Start: 2015-02-28 — End: ?

## 2015-02-28 MED FILL — DIPRIVAN 200 MG/20ML IV EMUL: 200 MG/20ML | INTRAVENOUS | Qty: 20

## 2015-02-28 NOTE — Anesthesia Pre-Procedure Evaluation (Signed)
Department of Anesthesiology  Preprocedure Note       Name:  [name]   Age:  81 y.o.  DOB:  November 04, 1927                                          MRN:  [id number]         Date:  02/28/2015      Surgeon: Leo GrosserAmbrose    Procedure: colonosocpy    Medications prior to admission:   Prior to Admission medications    Medication Sig Start Date End Date Taking? Authorizing Provider   MULTIPLE VITAMIN PO Take by mouth daily   Yes Historical Provider, MD   buPROPion (WELLBUTRIN SR) 150 MG extended release tablet Take 150 mg by mouth nightly   Yes Historical Provider, MD   donepezil (ARICEPT) 10 MG tablet Take 10 mg by mouth nightly   Yes Historical Provider, MD   dabigatran (PRADAXA) 150 MG capsule Take 150 mg by mouth 2 times daily.     Yes Historical Provider, MD   omeprazole (PRILOSEC) 20 MG delayed release capsule Take 2 capsules by mouth 2 times daily for 14 days 01/19/15 02/02/15  Delories Heinzaryl Donald, MD   simvastatin (ZOCOR) 20 MG tablet Take 20 mg by mouth nightly.      Historical Provider, MD   levothyroxine (SYNTHROID) 50 MCG tablet Take 50 mcg by mouth daily.      Historical Provider, MD   citalopram (CELEXA) 20 MG tablet Take 20 mg by mouth daily Instructed to take with sip water am of procedure    Historical Provider, MD   montelukast (SINGULAIR) 10 MG tablet Take 10 mg by mouth nightly.      Historical Provider, MD   raloxifene (EVISTA) 60 MG tablet Take 60 mg by mouth daily.      Historical Provider, MD   pregabalin (LYRICA) 150 MG capsule Take 150 mg by mouth 2 times daily Instructed to take with sip water am of procedure    Historical Provider, MD   digoxin (LANOXIN) 0.125 MG tablet Take 125 mcg by mouth every 48 hours Takes at night every other day    Historical Provider, MD   ipratropium-albuterol (DUONEB) 0.5-2.5 (3) MG/3ML SOLN nebulizer solution Inhale 1 vial into the lungs every 4 hours Uses prn / instructed if needs to use dos    Historical Provider, MD       Current medications:    Current Outpatient Prescriptions    Medication Sig Dispense Refill   ??? MULTIPLE VITAMIN PO Take by mouth daily     ??? buPROPion (WELLBUTRIN SR) 150 MG extended release tablet Take 150 mg by mouth nightly     ??? donepezil (ARICEPT) 10 MG tablet Take 10 mg by mouth nightly     ??? dabigatran (PRADAXA) 150 MG capsule Take 150 mg by mouth 2 times daily.       ??? omeprazole (PRILOSEC) 20 MG delayed release capsule Take 2 capsules by mouth 2 times daily for 14 days 28 capsule 0   ??? simvastatin (ZOCOR) 20 MG tablet Take 20 mg by mouth nightly.       ??? levothyroxine (SYNTHROID) 50 MCG tablet Take 50 mcg by mouth daily.       ??? citalopram (CELEXA) 20 MG tablet Take 20 mg by mouth daily Instructed to take with sip water am of procedure     ???  montelukast (SINGULAIR) 10 MG tablet Take 10 mg by mouth nightly.       ??? raloxifene (EVISTA) 60 MG tablet Take 60 mg by mouth daily.       ??? pregabalin (LYRICA) 150 MG capsule Take 150 mg by mouth 2 times daily Instructed to take with sip water am of procedure     ??? digoxin (LANOXIN) 0.125 MG tablet Take 125 mcg by mouth every 48 hours Takes at night every other day     ??? ipratropium-albuterol (DUONEB) 0.5-2.5 (3) MG/3ML SOLN nebulizer solution Inhale 1 vial into the lungs every 4 hours Uses prn / instructed if needs to use dos       Current Facility-Administered Medications   Medication Dose Route Frequency Provider Last Rate Last Dose   ??? 0.9 % sodium chloride infusion   Intravenous Continuous Erling Conte, MD           Allergies:    Allergies   Allergen Reactions   ??? Aspirin      Reaction unknown   ??? Eggs Or Egg-Derived Products Nausea And Vomiting   ??? Sulfa Antibiotics Nausea And Vomiting       Problem List:  There is no problem list on file for this patient.      Past Medical History:        Diagnosis Date   ??? A-fib (HCC)    ??? Anticoagulated      on pradaxa   ??? Asthma    ??? COPD (chronic obstructive pulmonary disease) (HCC)    ??? Depression    ??? Forgetfulness      capable of signing own consents   ??? GERD  (gastroesophageal reflux disease)    ??? HOH (hard of hearing)      no aides   ??? Hyperlipidemia    ??? Pain      bilateral upper quadrant /  for EGD   ??? Restless leg syndrome    ??? Thyroid disease        Past Surgical History:        Procedure Laterality Date   ??? Joint replacement Bilateral 2011?     knee   ??? Varicose vein surgery Bilateral 2012   ??? Back surgery  2010?     lumbar    ??? Breast surgery       Lumpectomy   ??? Hysterectomy  1970's       Social History:    Social History   Substance Use Topics   ??? Smoking status: Never Smoker   ??? Smokeless tobacco: Not on file   ??? Alcohol use No                                Counseling given: Not Answered      Vital Signs (Current):   Vitals:    02/25/15 1416   Weight: 181 lb (82.1 kg)   Height: 5\' 4"  (1.626 m)                                              BP Readings from Last 3 Encounters:   01/19/15 123/72   03/29/11 121/77       NPO Status: Time of last liquid consumption: 2330  BMI:   Wt Readings from Last 3 Encounters:   02/25/15 181 lb (82.1 kg)   01/19/15 167 lb (75.8 kg)   03/29/11 180 lb (81.6 kg)     Body mass index is 31.07 kg/(m^2).    Anesthesia Evaluation  Patient summary reviewed no history of anesthetic complications:   Airway: Mallampati: II  TM distance: >3 FB   Neck ROM: full   Dental:          Pulmonary: breath sounds clear to auscultation  (+) COPD:  asthma:        Cardiovascular:    (+) dysrhythmias: atrial flutter, hyperlipidemia      ECG reviewed  Rhythm: irregular  Rate: normal              Beta Blocker:  Not on Beta Blocker   Neuro/Psych:   (+) psychiatric history:      Comments: Forgetfulness capable of signing own consents  Restless leg syndrome  Depression GI/Hepatic/Renal:   (+) GERD:,         Endo/Other:    (+) hypothyroidism, blood dyscrasia: anticoagulation therapy, arthritis: OA.         Comments: obese Abdominal:                    Anesthesia Plan    ASA 3     MAC      intravenous induction   Anesthetic plan and risks discussed with patient.    Plan discussed with CRNA.            Sarina Ill, MD   02/28/2015

## 2015-02-28 NOTE — Anesthesia Post-Procedure Evaluation (Signed)
Department of Anesthesiology  Post-Anesthesia Note    Name:  [name]                                         Age:  81 y.o.  MRN:  [id number]     Last Vitals:    Visit Vitals   ??? BP (!) 101/56   ??? Pulse 80   ??? Temp 98.6 ??F (37 ??C) (Temporal)   ??? Resp 17   ??? Ht 5\' 4"  (1.626 m)   ??? Wt 181 lb (82.1 kg)   ??? SpO2 96%   ??? BMI 31.07 kg/m2     Patient Vitals for the past 4 hrs:   BP Temp Temp src Pulse Resp SpO2 Height Weight   02/28/15 1130 (!) 101/56 - - 80 17 96 % - -   02/28/15 1115 97/60 - - 92 18 95 % - -   02/28/15 1100 99/60 98.6 ??F (37 ??C) Temporal 95 18 94 % - -   02/28/15 0920 131/60 98.6 ??F (37 ??C) Temporal 94 18 94 % 5\' 4"  (1.626 m) 181 lb (82.1 kg)       Level of Consciousness:  Awake    Respiratory:  Stable    Oxygen Saturation:  Stable    Cardiovascular:  Stable    Hydration:  Adequate    PONV:  Stable    Post-op Pain:  Adequate analgesia    Post-op Assessment:  No apparent anesthetic complications    Additional Follow-Up / Treatment / Comment:  None    Sarina IllKeith A Kearra Calkin, MD  February 28, 2015   12:46 PM

## 2015-02-28 NOTE — Op Note (Signed)
[name] [facility] [place]  [facility]      DATE OF PROCEDURE: 02/28/2015    SURGEON: Dr. Erling ConteJoseph A Jaxton Casale  M.D.     ASSISTANT: [name]    PREOPERATIVE DIAGNOSES:   Epigastric pain    POSTOPERATIVE DIAGNOSES:   Type I hiatal hernia  gastritis   non-obstructing Schatzki ring.    OPERATION:    EGD esophagogastroduodenoscopy  43235  With biopsy                 43239     ANESTHESIA: LMAC    CONSENT AND INDICATIONS:  This is a 81 y.o. year old female who is having above. I have discussed with the patient and/or the patient representative the indication, alternatives, and the possible risks and/or complications of the planned procedure and the anesthesia methods. The patient and/or patient representative appear to understand and agree to proceed.      Complications: [name]    OPERATIONS: The patient was placed on the table and sedated via LMAC. The throat was numbed with Cetacaine spray. Bite block was placed. A lubricated scope was easily passed into the upper esophagus which looked normal. The distal esophagus looked abnormal: non-obstructing Schatzki ring. The scope was passed into the stomach and retroflexed. There was type 1 hiatal hernia. The scope was passed down toward the pylorus. The antral mucosa all looked abnormal: gastritis. Biopsy was taken to check for H. pylori. The scope was then passed through the pylorus into the duodenal bulb which looked normal, then around to the distal duodenum which looked normal, and the scope was then withdrawn.  The patient tolerated the procedure well.      Physician Signature: Electronically signed by Dr. Sammie BenchJoseph A Llewyn Heap M.D.

## 2015-02-28 NOTE — Progress Notes (Signed)
Discharge instructions and prescriptions given to patient and family. Questions answered and verbalizes understanding. Escorted to car via wheel chair.

## 2015-03-05 ENCOUNTER — Encounter

## 2016-02-19 ENCOUNTER — Inpatient Hospital Stay
Admission: EM | Admit: 2016-02-19 | Discharge: 2016-02-27 | Disposition: A | Discharge status: Skilled Nursing Facility | Source: Home / Self Care | Admitting: Surgery

## 2016-02-19 ENCOUNTER — Encounter: Admit: 2016-02-19 | Primary: Family Medicine

## 2016-02-19 ENCOUNTER — Encounter: Admit: 2016-02-19 | Attending: Surgery | Primary: Family Medicine

## 2016-02-19 DIAGNOSIS — R198 Other specified symptoms and signs involving the digestive system and abdomen: Secondary | ICD-10-CM

## 2016-02-19 LAB — TROPONIN: Troponin: <0.01 ng/mL (ref 0.00–0.03)

## 2016-02-19 LAB — PROTIME-INR
INR: 1.6
Protime: 17.5 s — ABNORMAL HIGH (ref 9.3–12.4)

## 2016-02-19 LAB — AMYLASE: Amylase: 98 U/L (ref 20–100)

## 2016-02-19 LAB — COMPREHENSIVE METABOLIC PANEL
ALT: 59 U/L — ABNORMAL HIGH (ref 0–32)
AST: 73 U/L — ABNORMAL HIGH (ref 0–31)
Albumin: 2.9 g/dL — ABNORMAL LOW (ref 3.5–5.2)
Alkaline Phosphatase: 154 U/L — ABNORMAL HIGH (ref 35–104)
Anion Gap: 23 mmol/L — ABNORMAL HIGH (ref 7–16)
BUN: 14 mg/dL (ref 8–23)
CO2: 21 mmol/L — ABNORMAL LOW (ref 22–29)
Calcium: 9.2 mg/dL (ref 8.6–10.2)
Chloride: 94 mmol/L — ABNORMAL LOW (ref 98–107)
Creatinine: 0.8 mg/dL (ref 0.5–1.0)
GFR African American: >60
GFR Non-African American: >60 mL/min/{1.73_m2} (ref >=60)
Glucose: 142 mg/dL — ABNORMAL HIGH (ref 74–109)
Potassium: 3 mmol/L — ABNORMAL LOW (ref 3.5–5.0)
Sodium: 138 mmol/L (ref 132–146)
Total Bilirubin: 1.9 mg/dL — ABNORMAL HIGH (ref 0.0–1.2)
Total Protein: 6.4 g/dL (ref 6.4–8.3)

## 2016-02-19 LAB — CBC
Hematocrit: 40.3 % (ref 34.0–48.0)
Hemoglobin: 13.2 g/dL (ref 11.5–15.5)
MCH: 32.4 pg (ref 26.0–35.0)
MCHC: 32.8 % (ref 32.0–34.5)
MCV: 99 fL (ref 80.0–99.9)
MPV: 10 fL (ref 7.0–12.0)
Platelets: 532 E9/L — ABNORMAL HIGH (ref 130–450)
RBC: 4.07 E12/L (ref 3.50–5.50)
RDW: 14.9 fL (ref 11.5–15.0)
WBC: 31.2 E9/L — ABNORMAL HIGH (ref 4.5–11.5)

## 2016-02-19 LAB — LIPASE: Lipase: 116 U/L — ABNORMAL HIGH (ref 13–60)

## 2016-02-19 LAB — EKG 12-LEAD
Atrial Rate: 133 {beats}/min
Q-T Interval: 426 ms
QRS Duration: 82 ms
QTc Calculation (Bazett): 555 ms
R Axis: -17 degrees
T Axis: 132 degrees
Ventricular Rate: 102 {beats}/min

## 2016-02-19 LAB — CBC WITH AUTO DIFFERENTIAL
Basophils %: 0.1 % (ref 0.0–2.0)
Basophils Absolute: 0.02 E9/L (ref 0.00–0.20)
Eosinophils %: 0 % (ref 0.0–6.0)
Eosinophils Absolute: 0 E9/L — ABNORMAL LOW (ref 0.05–0.50)
Hematocrit: 40.7 % (ref 34.0–48.0)
Hemoglobin: 13.8 g/dL (ref 11.5–15.5)
Immature Granulocytes #: 0.07 E9/L
Immature Granulocytes %: 0.4 % (ref 0.0–5.0)
Lymphocytes %: 3 % — ABNORMAL LOW (ref 20.0–42.0)
Lymphocytes Absolute: 0.57 E9/L — ABNORMAL LOW (ref 1.50–4.00)
MCH: 31.7 pg (ref 26.0–35.0)
MCHC: 33.9 % (ref 32.0–34.5)
MCV: 93.6 fL (ref 80.0–99.9)
MPV: 9.6 fL (ref 7.0–12.0)
Monocytes %: 8.7 % (ref 2.0–12.0)
Monocytes Absolute: 1.67 E9/L — ABNORMAL HIGH (ref 0.10–0.95)
Neutrophils %: 87.8 % — ABNORMAL HIGH (ref 43.0–80.0)
Neutrophils Absolute: 16.84 E9/L — ABNORMAL HIGH (ref 1.80–7.30)
Platelets: 543 E9/L — ABNORMAL HIGH (ref 130–450)
RBC: 4.35 E12/L (ref 3.50–5.50)
RDW: 14.7 fL (ref 11.5–15.0)
WBC: 19.2 E9/L — ABNORMAL HIGH (ref 4.5–11.5)

## 2016-02-19 LAB — URINALYSIS
Blood, Urine: NEGATIVE
Glucose, Ur: NEGATIVE mg/dL
Leukocyte Esterase, Urine: NEGATIVE
Nitrite, Urine: NEGATIVE
Specific Gravity, UA: >=1.03 (ref 1.005–1.030)
Urobilinogen, Urine: 2 E.U./dL — AB (ref <2.0)
pH, UA: 5.5 (ref 5.0–9.0)

## 2016-02-19 LAB — TYPE AND SCREEN
ABO/Rh: A NEG
Antibody Screen: NEGATIVE

## 2016-02-19 LAB — MICROSCOPIC URINALYSIS

## 2016-02-19 LAB — LACTIC ACID
Lactic Acid: 8.5 mmol/L (ref 0.5–2.2)
Lactic Acid: 8.7 mmol/L (ref 0.5–2.2)

## 2016-02-19 LAB — APTT: aPTT: 34.9 s (ref 24.5–35.1)

## 2016-02-19 LAB — BRAIN NATRIURETIC PEPTIDE: Pro-BNP: 1827 pg/mL — ABNORMAL HIGH (ref 0–450)

## 2016-02-19 MED ORDER — FENTANYL CITRATE (PF) 100 MCG/2ML IJ SOLN
100 MCG/2ML | INTRAMUSCULAR | Status: DC | PRN
Start: 2016-02-19 — End: 2016-02-19

## 2016-02-19 MED ORDER — ONDANSETRON HCL 4 MG/2ML IJ SOLN
4 | INTRAMUSCULAR | Status: AC
Start: 2016-02-19 — End: 2016-02-19

## 2016-02-19 MED ORDER — SODIUM CHLORIDE 0.9 % IV BOLUS
0.9 % | Freq: Once | INTRAVENOUS | Status: AC
Start: 2016-02-19 — End: 2016-02-19
  Administered 2016-02-19: 17:00:00 500 mL via INTRAVENOUS

## 2016-02-19 MED ORDER — LIDOCAINE HCL (PF) 2 % IJ SOLN
2 | INTRAMUSCULAR | Status: AC
Start: 2016-02-19 — End: 2016-02-19

## 2016-02-19 MED ORDER — SODIUM CHLORIDE 0.9 % IV SOLN
0.9 % | INTRAVENOUS | Status: DC
Start: 2016-02-19 — End: 2016-02-19

## 2016-02-19 MED ORDER — CEFTRIAXONE SODIUM 1 G IJ SOLR
1 g | Freq: Once | INTRAMUSCULAR | Status: AC
Start: 2016-02-19 — End: 2016-02-19
  Administered 2016-02-19: 21:00:00 1 g via INTRAVENOUS

## 2016-02-19 MED ORDER — MEPERIDINE HCL 25 MG/ML IJ SOLN
25 MG/ML | INTRAMUSCULAR | Status: DC | PRN
Start: 2016-02-19 — End: 2016-02-19

## 2016-02-19 MED ORDER — FAMOTIDINE 20 MG/2ML IV SOLN
20 MG/2ML | Freq: Once | INTRAVENOUS | Status: AC
Start: 2016-02-19 — End: 2016-02-19
  Administered 2016-02-19: 17:00:00 20 mg via INTRAVENOUS

## 2016-02-19 MED ORDER — PROMETHAZINE HCL 25 MG/ML IJ SOLN
25 MG/ML | Freq: Once | INTRAMUSCULAR | Status: DC | PRN
Start: 2016-02-19 — End: 2016-02-19

## 2016-02-19 MED ORDER — NORMAL SALINE FLUSH 0.9 % IV SOLN
0.9 % | INTRAVENOUS | Status: AC
Start: 2016-02-19 — End: 2016-02-20

## 2016-02-19 MED ORDER — SODIUM CHLORIDE 0.9 % IV BOLUS
0.9 % | Freq: Once | INTRAVENOUS | Status: AC
Start: 2016-02-19 — End: 2016-02-19
  Administered 2016-02-19: 20:00:00 1000 mL via INTRAVENOUS

## 2016-02-19 MED ORDER — SUCCINYLCHOLINE CHLORIDE 20 MG/ML IJ SOLN
20 | INTRAMUSCULAR | Status: AC
Start: 2016-02-19 — End: 2016-02-19

## 2016-02-19 MED ORDER — MORPHINE SULFATE 4 MG/ML IJ SOLN
4 MG/ML | Freq: Once | INTRAMUSCULAR | Status: DC
Start: 2016-02-19 — End: 2016-02-19

## 2016-02-19 MED ORDER — FENTANYL CITRATE (PF) 250 MCG/5ML IJ SOLN
250 | INTRAMUSCULAR | Status: AC
Start: 2016-02-19 — End: 2016-02-19

## 2016-02-19 MED ORDER — DEXAMETHASONE SODIUM PHOSPHATE 20 MG/5ML IJ SOLN
20 | INTRAMUSCULAR | Status: AC
Start: 2016-02-19 — End: 2016-02-19

## 2016-02-19 MED ORDER — HYDROCODONE-ACETAMINOPHEN 5-325 MG PO TABS
5-325 MG | Freq: Once | ORAL | Status: DC | PRN
Start: 2016-02-19 — End: 2016-02-19

## 2016-02-19 MED ORDER — GLYCOPYRROLATE 1 MG/5ML IJ SOLN
1 | INTRAMUSCULAR | Status: AC
Start: 2016-02-19 — End: 2016-02-19

## 2016-02-19 MED ORDER — PROPOFOL 200 MG/20ML IV EMUL
200 | INTRAVENOUS | Status: AC
Start: 2016-02-19 — End: 2016-02-19

## 2016-02-19 MED ORDER — ONDANSETRON HCL 4 MG/2ML IJ SOLN
4 MG/2ML | Freq: Once | INTRAMUSCULAR | Status: AC
Start: 2016-02-19 — End: 2016-02-19
  Administered 2016-02-19: 17:00:00 4 mg via INTRAVENOUS

## 2016-02-19 MED ORDER — ROCURONIUM BROMIDE 50 MG/5ML IV SOLN
50 | INTRAVENOUS | Status: AC
Start: 2016-02-19 — End: 2016-02-19

## 2016-02-19 MED ORDER — FLUCONAZOLE IN SODIUM CHLORIDE 200-0.9 MG/100ML-% IV SOLN
INTRAVENOUS | Status: DC
Start: 2016-02-19 — End: 2016-02-26
  Administered 2016-02-20 – 2016-02-26 (×7): 200 mg via INTRAVENOUS

## 2016-02-19 MED ORDER — HYDROMORPHONE HCL 1 MG/ML IJ SOLN
1 MG/ML | INTRAMUSCULAR | Status: DC | PRN
Start: 2016-02-19 — End: 2016-02-19

## 2016-02-19 MED ORDER — METRONIDAZOLE IN NACL 5-0.79 MG/ML-% IV SOLN
5 mg/mL | Freq: Once | INTRAVENOUS | Status: AC
Start: 2016-02-19 — End: 2016-02-19
  Administered 2016-02-19: 19:00:00 500 mg via INTRAVENOUS

## 2016-02-19 MED ORDER — IOPAMIDOL 76 % IV SOLN
76 % | Freq: Once | INTRAVENOUS | Status: AC | PRN
Start: 2016-02-19 — End: 2016-02-19
  Administered 2016-02-19: 18:00:00 110 mL via INTRAVENOUS

## 2016-02-19 MED ORDER — IOHEXOL 240 MG/ML IJ SOLN
240 MG/ML | Freq: Once | INTRAMUSCULAR | Status: AC | PRN
Start: 2016-02-19 — End: 2016-02-19
  Administered 2016-02-19: 18:00:00 50 mL via ORAL

## 2016-02-19 MED FILL — QUELICIN 20 MG/ML IJ SOLN: 20 MG/ML | INTRAMUSCULAR | Qty: 10

## 2016-02-19 MED FILL — GLYCOPYRROLATE 1 MG/5ML IJ SOLN: 1 MG/5ML | INTRAMUSCULAR | Qty: 5

## 2016-02-19 MED FILL — FENTANYL CITRATE (PF) 250 MCG/5ML IJ SOLN: 250 MCG/5ML | INTRAMUSCULAR | Qty: 5

## 2016-02-19 MED FILL — FLUCONAZOLE IN SODIUM CHLORIDE 200-0.9 MG/100ML-% IV SOLN: INTRAVENOUS | Qty: 100

## 2016-02-19 MED FILL — ONDANSETRON HCL 4 MG/2ML IJ SOLN: 4 MG/2ML | INTRAMUSCULAR | Qty: 2

## 2016-02-19 MED FILL — DEXAMETHASONE SODIUM PHOSPHATE 20 MG/5ML IJ SOLN: 20 MG/5ML | INTRAMUSCULAR | Qty: 5

## 2016-02-19 MED FILL — DIPRIVAN 200 MG/20ML IV EMUL: 200 MG/20ML | INTRAVENOUS | Qty: 20

## 2016-02-19 MED FILL — FAMOTIDINE 20 MG/2ML IV SOLN: 20 MG/2ML | INTRAVENOUS | Qty: 2

## 2016-02-19 MED FILL — METRONIDAZOLE IN NACL 5-0.79 MG/ML-% IV SOLN: 5 mg/mL | INTRAVENOUS | Qty: 100

## 2016-02-19 MED FILL — CEFTRIAXONE SODIUM 1 G IJ SOLR: 1 g | INTRAMUSCULAR | Qty: 1

## 2016-02-19 MED FILL — STERILE WATER FOR INJECTION IJ SOLN: INTRAMUSCULAR | Qty: 10

## 2016-02-19 MED FILL — ROCURONIUM BROMIDE 50 MG/5ML IV SOLN: 50 MG/5ML | INTRAVENOUS | Qty: 5

## 2016-02-19 MED FILL — LIDOCAINE HCL (PF) 2 % IJ SOLN: 2 % | INTRAMUSCULAR | Qty: 5

## 2016-02-19 MED FILL — NORMAL SALINE FLUSH 0.9 % IV SOLN: 0.9 % | INTRAVENOUS | Qty: 20

## 2016-02-19 NOTE — ED Provider Notes (Signed)
HPI:  02/19/16, Time: 12:20 PM    [name] is a 83 y.o. female presenting to the ED via private car complaining of right lower abdominal pain, beginning earlier this morning ago. The complaint has been persistent, severe in severity, and worsened by nothing. Due the pt being extremely head of hearing and does not have her hearing aide at this time, pt's family provided the HPI. Pt woke at 3 AM with her sx. Denies general weakness, shortness of breath, n/v/d, chest pain, fever, chills, cough, or any other general complaints at this time. Pt has a hx of hysterectomy.     At bedside, pt's family stated they did not want the patient to receive any narcotic pain medications under any circumstances.    ROS:   Pertinent positives and negatives are stated within HPI, all other systems reviewed and are negative.    --------------------------------------------- PAST HISTORY ---------------------------------------------  Past Medical History:  has a past medical history of A-fib (HCC); Anticoagulated; Asthma; COPD (chronic obstructive pulmonary disease) (HCC); Depression; Forgetfulness; GERD (gastroesophageal reflux disease); HOH (hard of hearing); Hyperlipidemia; Pain; Restless leg syndrome; and Thyroid disease.    Past Surgical History:  has a past surgical history that includes joint replacement (Bilateral, 2011?); Varicose vein surgery (Bilateral, 2012); back surgery (2010?); Breast surgery; Hysterectomy (1970's); Endoscopy, colon, diagnostic (02/28/2015); and Abdomen surgery (02/19/2016).    Social History:  reports that she has never smoked. She does not have any smokeless tobacco history on file. She reports that she does not drink alcohol or use drugs.    Family History: family history is not on file.     The patient???s home medications have been reviewed.    Allergies: Aspirin; Chicken allergy; Eggs or egg-derived products; and Sulfa antibiotics    -------------------------------------------------- RESULTS  -------------------------------------------------  All laboratory and radiology results have been personally reviewed by myself   LABS:  Results for orders placed or performed during the hospital encounter of 02/19/16   Urine Culture   Result Value Ref Range    Urine Culture, Routine       10-100,000 CFU/mL  Mixed flora isolated. Further workup and sensitivity testing  is not routinely indicated and will not be performed.  Mixed flora isolated includes:  Mixed gram positive organisms  Gram negative rods     Culture Blood #1   Result Value Ref Range    Blood Culture, Routine 5 Days- no growth    Culture Blood #2   Result Value Ref Range    Culture, Blood 2 5 Days- no growth    MRSA SCREENING CULTURE ONLY   Result Value Ref Range    MRSA Culture Only Methicillin resistant Staph aureus not isolated    Troponin   Result Value Ref Range    Troponin <0.01 0.00 - 0.03 ng/mL   Brain Natriuretic Peptide   Result Value Ref Range    Pro-BNP 1,827 (H) 0 - 450 pg/mL   CBC Auto Differential   Result Value Ref Range    WBC 19.2 (H) 4.5 - 11.5 E9/L    RBC 4.35 3.50 - 5.50 E12/L    Hemoglobin 13.8 11.5 - 15.5 g/dL    Hematocrit 82.9 56.2 - 48.0 %    MCV 93.6 80.0 - 99.9 fL    MCH 31.7 26.0 - 35.0 pg    MCHC 33.9 32.0 - 34.5 %    RDW 14.7 11.5 - 15.0 fL    Platelets 543 (H) 130 - 450 E9/L  MPV 9.6 7.0 - 12.0 fL    Neutrophils % 87.8 (H) 43.0 - 80.0 %    Immature Granulocytes % 0.4 0.0 - 5.0 %    Lymphocytes % 3.0 (L) 20.0 - 42.0 %    Monocytes % 8.7 2.0 - 12.0 %    Eosinophils % 0.0 0.0 - 6.0 %    Basophils % 0.1 0.0 - 2.0 %    Neutrophils # 16.84 (H) 1.80 - 7.30 E9/L    Immature Granulocytes # 0.07 E9/L    Lymphocytes # 0.57 (L) 1.50 - 4.00 E9/L    Monocytes # 1.67 (H) 0.10 - 0.95 E9/L    Eosinophils # 0.00 (L) 0.05 - 0.50 E9/L    Basophils # 0.02 0.00 - 0.20 E9/L    Anisocytosis 1+     Poikilocytes 1+     Burr Cells 2+    Comprehensive Metabolic Panel   Result Value Ref Range    Sodium 138 132 - 146 mmol/L    Potassium 3.0 (L)  3.5 - 5.0 mmol/L    Chloride 94 (L) 98 - 107 mmol/L    CO2 21 (L) 22 - 29 mmol/L    Anion Gap 23 (H) 7 - 16 mmol/L    Glucose 142 (H) 74 - 109 mg/dL    BUN 14 8 - 23 mg/dL    CREATININE 0.8 0.5 - 1.0 mg/dL    GFR Non-African American >60 >=60 mL/min/1.73    GFR African American >60     Calcium 9.2 8.6 - 10.2 mg/dL    Total Protein 6.4 6.4 - 8.3 g/dL    Alb 2.9 (L) 3.5 - 5.2 g/dL    Total Bilirubin 1.9 (H) 0.0 - 1.2 mg/dL    Alkaline Phosphatase 154 (H) 35 - 104 U/L    ALT 59 (H) 0 - 32 U/L    AST 73 (H) 0 - 31 U/L   Amylase   Result Value Ref Range    Amylase 98 20 - 100 U/L   Lipase   Result Value Ref Range    Lipase 116 (H) 13 - 60 U/L   Lactic Acid, Plasma   Result Value Ref Range    Lactic Acid 8.5 (HH) 0.5 - 2.2 mmol/L   Urinalysis   Result Value Ref Range    Color, UA AMBER (A) Straw/Yellow    Clarity, UA SL CLOUDY Clear    Glucose, Ur Negative Negative mg/dL    Bilirubin Urine SMALL (A) Negative    Ketones, Urine TRACE (A) Negative mg/dL    Specific Gravity, UA >=1.030 1.005 - 1.030    Blood, Urine Negative Negative    pH, UA 5.5 5.0 - 9.0    Protein, UA TRACE Negative mg/dL    Urobilinogen, Urine 2.0 (A) <2.0 E.U./dL    Nitrite, Urine Negative Negative    Leukocyte Esterase, Urine Negative Negative   Protime-INR   Result Value Ref Range    Protime 17.5 (H) 9.3 - 12.4 sec    INR 1.6    APTT   Result Value Ref Range    aPTT 34.9 24.5 - 35.1 sec   Microscopic Urinalysis   Result Value Ref Range    Mucus, UA Present     WBC, UA 10-20 (A) 0 - 5 /HPF    RBC, UA 1-3 0 - 2 /HPF    Epi Cells MANY /HPF    Renal Epithelial, Urine FEW /HPF    Bacteria, UA MANY (A) /HPF  Lactic Acid, Plasma   Result Value Ref Range    Lactic Acid 8.7 (HH) 0.5 - 2.2 mmol/L   Lactic Acid, Plasma   Result Value Ref Range    Lactic Acid 6.6 (HH) 0.5 - 2.2 mmol/L   Comprehensive metabolic panel   Result Value Ref Range    Sodium 137 132 - 146 mmol/L    Potassium 3.6 3.5 - 5.0 mmol/L    Chloride 99 98 - 107 mmol/L    CO2 21 (L) 22 - 29 mmol/L     Anion Gap 17 (H) 7 - 16 mmol/L    Glucose 123 (H) 74 - 109 mg/dL    BUN 12 8 - 23 mg/dL    CREATININE 0.6 0.5 - 1.0 mg/dL    GFR Non-African American >60 >=60 mL/min/1.73    GFR African American >60     Calcium 7.8 (L) 8.6 - 10.2 mg/dL    Total Protein 5.2 (L) 6.4 - 8.3 g/dL    Alb 2.3 (L) 3.5 - 5.2 g/dL    Total Bilirubin 0.8 0.0 - 1.2 mg/dL    Alkaline Phosphatase 115 (H) 35 - 104 U/L    ALT 49 (H) 0 - 32 U/L    AST 69 (H) 0 - 31 U/L   CBC   Result Value Ref Range    WBC 31.2 (H) 4.5 - 11.5 E9/L    RBC 4.07 3.50 - 5.50 E12/L    Hemoglobin 13.2 11.5 - 15.5 g/dL    Hematocrit 40.940.3 81.134.0 - 48.0 %    MCV 99.0 80.0 - 99.9 fL    MCH 32.4 26.0 - 35.0 pg    MCHC 32.8 32.0 - 34.5 %    RDW 14.9 11.5 - 15.0 fL    Platelets 532 (H) 130 - 450 E9/L    MPV 10.0 7.0 - 12.0 fL   Comprehensive metabolic panel   Result Value Ref Range    Sodium 145 132 - 146 mmol/L    Potassium 4.3 3.5 - 5.0 mmol/L    Chloride 107 98 - 107 mmol/L    CO2 24 22 - 29 mmol/L    Anion Gap 14 7 - 16 mmol/L    Glucose 128 (H) 74 - 109 mg/dL    BUN 12 8 - 23 mg/dL    CREATININE 0.8 0.5 - 1.0 mg/dL    GFR Non-African American >60 >=60 mL/min/1.73    GFR African American >60     Calcium 7.5 (L) 8.6 - 10.2 mg/dL    Total Protein 4.9 (L) 6.4 - 8.3 g/dL    Alb 2.2 (L) 3.5 - 5.2 g/dL    Total Bilirubin 0.8 0.0 - 1.2 mg/dL    Alkaline Phosphatase 105 (H) 35 - 104 U/L    ALT 37 (H) 0 - 32 U/L    AST 48 (H) 0 - 31 U/L   CBC auto differential   Result Value Ref Range    WBC 30.6 (H) 4.5 - 11.5 E9/L    RBC 3.65 3.50 - 5.50 E12/L    Hemoglobin 11.6 11.5 - 15.5 g/dL    Hematocrit 91.435.1 78.234.0 - 48.0 %    MCV 96.2 80.0 - 99.9 fL    MCH 31.8 26.0 - 35.0 pg    MCHC 33.0 32.0 - 34.5 %    RDW 15.3 (H) 11.5 - 15.0 fL    Platelets 402 130 - 450 E9/L    MPV 9.8 7.0 - 12.0 fL  Neutrophils % 90.8 (H) 43.0 - 80.0 %    Immature Granulocytes % 0.6 0.0 - 5.0 %    Lymphocytes % 3.2 (L) 20.0 - 42.0 %    Monocytes % 5.2 2.0 - 12.0 %    Eosinophils % 0.0 0.0 - 6.0 %    Basophils % 0.2  0.0 - 2.0 %    Neutrophils # 27.73 (H) 1.80 - 7.30 E9/L    Immature Granulocytes # 0.18 E9/L    Lymphocytes # 0.99 (L) 1.50 - 4.00 E9/L    Monocytes # 1.59 (H) 0.10 - 0.95 E9/L    Eosinophils # 0.00 (L) 0.05 - 0.50 E9/L    Basophils # 0.06 0.00 - 0.20 E9/L    Polychromasia 1+     Poikilocytes 1+     Burr Cells 3+    Lactic acid, plasma   Result Value Ref Range    Lactic Acid 4.5 (HH) 0.5 - 2.2 mmol/L   Lactic acid, plasma   Result Value Ref Range    Lactic Acid 3.1 (H) 0.5 - 2.2 mmol/L   Magnesium   Result Value Ref Range    Magnesium 1.8 1.6 - 2.6 mg/dL   Phosphorus   Result Value Ref Range    Phosphorus 3.1 2.5 - 4.5 mg/dL   Lactic acid, plasma   Result Value Ref Range    Lactic Acid 2.2 0.5 - 2.2 mmol/L   Calcium, ionized   Result Value Ref Range    Calcium, Ion 1.18 1.15 - 1.33 mmol/L   Lactic acid, plasma   Result Value Ref Range    Lactic Acid 3.2 (H) 0.5 - 2.2 mmol/L   Lactic acid, plasma   Result Value Ref Range    Lactic Acid 2.8 (H) 0.5 - 2.2 mmol/L   Comprehensive metabolic panel   Result Value Ref Range    Sodium 142 132 - 146 mmol/L    Potassium 3.7 3.5 - 5.0 mmol/L    Chloride 105 98 - 107 mmol/L    CO2 26 22 - 29 mmol/L    Anion Gap 11 7 - 16 mmol/L    Glucose 96 74 - 109 mg/dL    BUN 20 8 - 23 mg/dL    CREATININE 0.9 0.5 - 1.0 mg/dL    GFR Non-African American 59 >=60 mL/min/1.73    GFR African American >60     Calcium 8.4 (L) 8.6 - 10.2 mg/dL    Total Protein 5.6 (L) 6.4 - 8.3 g/dL    Alb 2.2 (L) 3.5 - 5.2 g/dL    Total Bilirubin 0.8 0.0 - 1.2 mg/dL    Alkaline Phosphatase 113 (H) 35 - 104 U/L    ALT <5 0 - 32 U/L    AST 36 (H) 0 - 31 U/L   CBC auto differential   Result Value Ref Range    WBC 26.9 (H) 4.5 - 11.5 E9/L    RBC 3.65 3.50 - 5.50 E12/L    Hemoglobin 11.6 11.5 - 15.5 g/dL    Hematocrit 16.1 09.6 - 48.0 %    MCV 97.0 80.0 - 99.9 fL    MCH 31.8 26.0 - 35.0 pg    MCHC 32.8 32.0 - 34.5 %    RDW 15.2 (H) 11.5 - 15.0 fL    Platelets 451 (H) 130 - 450 E9/L    MPV 9.6 7.0 - 12.0 fL    Neutrophils  % 82.9 (H) 43.0 - 80.0 %    Immature Granulocytes %  0.7 0.0 - 5.0 %    Lymphocytes % 6.1 (L) 20.0 - 42.0 %    Monocytes % 10.2 2.0 - 12.0 %    Eosinophils % 0.0 0.0 - 6.0 %    Basophils % 0.1 0.0 - 2.0 %    Neutrophils # 22.30 (H) 1.80 - 7.30 E9/L    Immature Granulocytes # 0.18 E9/L    Lymphocytes # 1.63 1.50 - 4.00 E9/L    Monocytes # 2.75 (H) 0.10 - 0.95 E9/L    Eosinophils # 0.00 (L) 0.05 - 0.50 E9/L    Basophils # 0.03 0.00 - 0.20 E9/L    Anisocytosis 1+     Poikilocytes 2+     Burr Cells 2+    Magnesium   Result Value Ref Range    Magnesium 2.1 1.6 - 2.6 mg/dL   Phosphorus   Result Value Ref Range    Phosphorus 2.6 2.5 - 4.5 mg/dL   Lactic acid, plasma   Result Value Ref Range    Lactic Acid 1.9 0.5 - 2.2 mmol/L   Lactic acid, plasma   Result Value Ref Range    Lactic Acid 2.2 0.5 - 2.2 mmol/L   Lactic acid, plasma   Result Value Ref Range    Lactic Acid 1.9 0.5 - 2.2 mmol/L   Comprehensive metabolic panel   Result Value Ref Range    Sodium 144 132 - 146 mmol/L    Potassium 3.4 (L) 3.5 - 5.0 mmol/L    Chloride 108 (H) 98 - 107 mmol/L    CO2 24 22 - 29 mmol/L    Anion Gap 12 7 - 16 mmol/L    Glucose 78 74 - 109 mg/dL    BUN 26 (H) 8 - 23 mg/dL    CREATININE 0.9 0.5 - 1.0 mg/dL    GFR Non-African American 59 >=60 mL/min/1.73    GFR African American >60     Calcium 8.1 (L) 8.6 - 10.2 mg/dL    Total Protein 5.1 (L) 6.4 - 8.3 g/dL    Alb 2.1 (L) 3.5 - 5.2 g/dL    Total Bilirubin 0.8 0.0 - 1.2 mg/dL    Alkaline Phosphatase 113 (H) 35 - 104 U/L    ALT 35 (H) 0 - 32 U/L    AST 50 (H) 0 - 31 U/L   CBC auto differential   Result Value Ref Range    WBC 17.2 (H) 4.5 - 11.5 E9/L    RBC 3.56 3.50 - 5.50 E12/L    Hemoglobin 11.3 (L) 11.5 - 15.5 g/dL    Hematocrit 91.4 78.2 - 48.0 %    MCV 97.5 80.0 - 99.9 fL    MCH 31.7 26.0 - 35.0 pg    MCHC 32.6 32.0 - 34.5 %    RDW 14.9 11.5 - 15.0 fL    Platelets 361 130 - 450 E9/L    MPV 9.4 7.0 - 12.0 fL    Neutrophils % 82.6 (H) 43.0 - 80.0 %    Immature Granulocytes % 0.8 0.0 - 5.0 %     Lymphocytes % 7.9 (L) 20.0 - 42.0 %    Monocytes % 8.5 2.0 - 12.0 %    Eosinophils % 0.1 0.0 - 6.0 %    Basophils % 0.1 0.0 - 2.0 %    Neutrophils # 14.16 (H) 1.80 - 7.30 E9/L    Immature Granulocytes # 0.14 E9/L    Lymphocytes # 1.36 (L) 1.50 - 4.00 E9/L  Monocytes # 1.46 (H) 0.10 - 0.95 E9/L    Eosinophils # 0.01 (L) 0.05 - 0.50 E9/L    Basophils # 0.02 0.00 - 0.20 E9/L   Magnesium   Result Value Ref Range    Magnesium 2.1 1.6 - 2.6 mg/dL   Phosphorus   Result Value Ref Range    Phosphorus 2.4 (L) 2.5 - 4.5 mg/dL   Lactic acid, plasma   Result Value Ref Range    Lactic Acid 1.6 0.5 - 2.2 mmol/L   Lactic acid, plasma   Result Value Ref Range    Lactic Acid 1.9 0.5 - 2.2 mmol/L   Lactic acid, plasma   Result Value Ref Range    Lactic Acid 1.5 0.5 - 2.2 mmol/L   Comprehensive metabolic panel   Result Value Ref Range    Sodium 145 132 - 146 mmol/L    Potassium 3.9 3.5 - 5.0 mmol/L    Chloride 108 (H) 98 - 107 mmol/L    CO2 24 22 - 29 mmol/L    Anion Gap 13 7 - 16 mmol/L    Glucose 89 74 - 109 mg/dL    BUN 27 (H) 8 - 23 mg/dL    CREATININE 0.8 0.5 - 1.0 mg/dL    GFR Non-African American >60 >=60 mL/min/1.73    GFR African American >60     Calcium 8.1 (L) 8.6 - 10.2 mg/dL    Total Protein 5.2 (L) 6.4 - 8.3 g/dL    Alb 2.1 (L) 3.5 - 5.2 g/dL    Total Bilirubin 0.8 0.0 - 1.2 mg/dL    Alkaline Phosphatase 118 (H) 35 - 104 U/L    ALT 34 (H) 0 - 32 U/L    AST 47 (H) 0 - 31 U/L   CBC auto differential   Result Value Ref Range    WBC 9.5 4.5 - 11.5 E9/L    RBC 3.47 (L) 3.50 - 5.50 E12/L    Hemoglobin 11.0 (L) 11.5 - 15.5 g/dL    Hematocrit 16.1 (L) 34.0 - 48.0 %    MCV 96.5 80.0 - 99.9 fL    MCH 31.7 26.0 - 35.0 pg    MCHC 32.8 32.0 - 34.5 %    RDW 14.9 11.5 - 15.0 fL    Platelets 334 130 - 450 E9/L    MPV 9.1 7.0 - 12.0 fL    Neutrophils % 75.1 43.0 - 80.0 %    Immature Granulocytes % 0.5 0.0 - 5.0 %    Lymphocytes % 13.8 (L) 20.0 - 42.0 %    Monocytes % 10.5 2.0 - 12.0 %    Eosinophils % 0.0 0.0 - 6.0 %    Basophils %  0.1 0.0 - 2.0 %    Neutrophils # 7.12 1.80 - 7.30 E9/L    Immature Granulocytes # 0.05 E9/L    Lymphocytes # 1.31 (L) 1.50 - 4.00 E9/L    Monocytes # 1.00 (H) 0.10 - 0.95 E9/L    Eosinophils # 0.00 (L) 0.05 - 0.50 E9/L    Basophils # 0.01 0.00 - 0.20 E9/L   Magnesium   Result Value Ref Range    Magnesium 2.1 1.6 - 2.6 mg/dL   Phosphorus   Result Value Ref Range    Phosphorus 2.6 2.5 - 4.5 mg/dL   Comprehensive metabolic panel   Result Value Ref Range    Sodium 149 (H) 132 - 146 mmol/L    Potassium 3.7 3.5 - 5.0 mmol/L  Chloride 110 (H) 98 - 107 mmol/L    CO2 27 22 - 29 mmol/L    Anion Gap 12 7 - 16 mmol/L    Glucose 90 74 - 109 mg/dL    BUN 27 (H) 8 - 23 mg/dL    CREATININE 0.8 0.5 - 1.0 mg/dL    GFR Non-African American >60 >=60 mL/min/1.73    GFR African American >60     Calcium 8.1 (L) 8.6 - 10.2 mg/dL    Total Protein 5.1 (L) 6.4 - 8.3 g/dL    Alb 2.4 (L) 3.5 - 5.2 g/dL    Total Bilirubin 0.7 0.0 - 1.2 mg/dL    Alkaline Phosphatase 157 (H) 35 - 104 U/L    ALT 28 0 - 32 U/L    AST 39 (H) 0 - 31 U/L   CBC auto differential   Result Value Ref Range    WBC 7.4 4.5 - 11.5 E9/L    RBC 3.47 (L) 3.50 - 5.50 E12/L    Hemoglobin 10.7 (L) 11.5 - 15.5 g/dL    Hematocrit 16.1 (L) 34.0 - 48.0 %    MCV 97.1 80.0 - 99.9 fL    MCH 30.8 26.0 - 35.0 pg    MCHC 31.8 (L) 32.0 - 34.5 %    RDW 14.6 11.5 - 15.0 fL    Platelets 306 130 - 450 E9/L    MPV 9.4 7.0 - 12.0 fL    Neutrophils % 68.5 43.0 - 80.0 %    Immature Granulocytes % 0.7 0.0 - 5.0 %    Lymphocytes % 17.8 (L) 20.0 - 42.0 %    Monocytes % 12.8 (H) 2.0 - 12.0 %    Eosinophils % 0.1 0.0 - 6.0 %    Basophils % 0.1 0.0 - 2.0 %    Neutrophils # 5.05 1.80 - 7.30 E9/L    Immature Granulocytes # 0.05 E9/L    Lymphocytes # 1.31 (L) 1.50 - 4.00 E9/L    Monocytes # 0.94 0.10 - 0.95 E9/L    Eosinophils # 0.01 (L) 0.05 - 0.50 E9/L    Basophils # 0.01 0.00 - 0.20 E9/L   Comprehensive metabolic panel   Result Value Ref Range    Sodium 148 (H) 132 - 146 mmol/L    Potassium 3.1 (L) 3.5 -  5.0 mmol/L    Chloride 108 (H) 98 - 107 mmol/L    CO2 29 22 - 29 mmol/L    Anion Gap 11 7 - 16 mmol/L    Glucose 108 74 - 109 mg/dL    BUN 25 (H) 8 - 23 mg/dL    CREATININE 0.7 0.5 - 1.0 mg/dL    GFR Non-African American >60 >=60 mL/min/1.73    GFR African American >60     Calcium 8.9 8.6 - 10.2 mg/dL    Total Protein 5.7 (L) 6.4 - 8.3 g/dL    Alb 2.7 (L) 3.5 - 5.2 g/dL    Total Bilirubin 0.9 0.0 - 1.2 mg/dL    Alkaline Phosphatase 119 (H) 35 - 104 U/L    ALT 29 0 - 32 U/L    AST 43 (H) 0 - 31 U/L   CBC auto differential   Result Value Ref Range    WBC 6.6 4.5 - 11.5 E9/L    RBC 3.54 3.50 - 5.50 E12/L    Hemoglobin 11.1 (L) 11.5 - 15.5 g/dL    Hematocrit 09.6 (L) 34.0 - 48.0 %    MCV 95.5  80.0 - 99.9 fL    MCH 31.4 26.0 - 35.0 pg    MCHC 32.8 32.0 - 34.5 %    RDW 14.4 11.5 - 15.0 fL    Platelets 287 130 - 450 E9/L    MPV 9.7 7.0 - 12.0 fL    Neutrophils % 59.5 43.0 - 80.0 %    Immature Granulocytes % 1.2 0.0 - 5.0 %    Lymphocytes % 24.4 20.0 - 42.0 %    Monocytes % 14.4 (H) 2.0 - 12.0 %    Eosinophils % 0.3 0.0 - 6.0 %    Basophils % 0.2 0.0 - 2.0 %    Neutrophils # 3.92 1.80 - 7.30 E9/L    Immature Granulocytes # 0.08 E9/L    Lymphocytes # 1.61 1.50 - 4.00 E9/L    Monocytes # 0.95 0.10 - 0.95 E9/L    Eosinophils # 0.02 (L) 0.05 - 0.50 E9/L    Basophils # 0.01 0.00 - 0.20 E9/L   Comprehensive metabolic panel   Result Value Ref Range    Sodium 148 (H) 132 - 146 mmol/L    Potassium 2.5 (LL) 3.5 - 5.0 mmol/L    Chloride 107 98 - 107 mmol/L    CO2 31 (H) 22 - 29 mmol/L    Anion Gap 10 7 - 16 mmol/L    Glucose 107 74 - 109 mg/dL    BUN 18 8 - 23 mg/dL    CREATININE 0.7 0.5 - 1.0 mg/dL    GFR Non-African American >60 >=60 mL/min/1.73    GFR African American >60     Calcium 8.5 (L) 8.6 - 10.2 mg/dL    Total Protein 5.2 (L) 6.4 - 8.3 g/dL    Alb 2.6 (L) 3.5 - 5.2 g/dL    Total Bilirubin 0.8 0.0 - 1.2 mg/dL    Alkaline Phosphatase 112 (H) 35 - 104 U/L    ALT 30 0 - 32 U/L    AST 44 (H) 0 - 31 U/L   CBC auto differential    Result Value Ref Range    WBC 6.1 4.5 - 11.5 E9/L    RBC 3.52 3.50 - 5.50 E12/L    Hemoglobin 11.1 (L) 11.5 - 15.5 g/dL    Hematocrit 16.1 (L) 34.0 - 48.0 %    MCV 95.7 80.0 - 99.9 fL    MCH 31.5 26.0 - 35.0 pg    MCHC 32.9 32.0 - 34.5 %    RDW 14.5 11.5 - 15.0 fL    Platelets 235 130 - 450 E9/L    MPV 9.8 7.0 - 12.0 fL    Neutrophils % 58.3 43.0 - 80.0 %    Immature Granulocytes % 1.0 0.0 - 5.0 %    Lymphocytes % 27.1 20.0 - 42.0 %    Monocytes % 12.1 (H) 2.0 - 12.0 %    Eosinophils % 1.3 0.0 - 6.0 %    Basophils % 0.2 0.0 - 2.0 %    Neutrophils # 3.53 1.80 - 7.30 E9/L    Immature Granulocytes # 0.06 E9/L    Lymphocytes # 1.64 1.50 - 4.00 E9/L    Monocytes # 0.73 0.10 - 0.95 E9/L    Eosinophils # 0.08 0.05 - 0.50 E9/L    Basophils # 0.01 0.00 - 0.20 E9/L   Potassium   Result Value Ref Range    Potassium 2.8 (L) 3.5 - 5.0 mmol/L   Basic metabolic panel   Result Value Ref Range  Sodium 146 132 - 146 mmol/L    Potassium 3.2 (L) 3.5 - 5.0 mmol/L    Chloride 105 98 - 107 mmol/L    CO2 32 (H) 22 - 29 mmol/L    Anion Gap 9 7 - 16 mmol/L    Glucose 116 (H) 74 - 109 mg/dL    BUN 16 8 - 23 mg/dL    CREATININE 0.8 0.5 - 1.0 mg/dL    GFR Non-African American >60 >=60 mL/min/1.73    GFR African American >60     Calcium 8.8 8.6 - 10.2 mg/dL   EKG 12 Lead   Result Value Ref Range    Ventricular Rate 102 BPM    Atrial Rate 133 BPM    QRS Duration 82 ms    Q-T Interval 426 ms    QTc Calculation (Bazett) 555 ms    R Axis -17 degrees    T Axis 132 degrees   TYPE AND SCREEN   Result Value Ref Range    ABO/Rh A NEG     Antibody Screen NEG        RADIOLOGY:  Interpreted by Radiologist.  FL UGI W KUB   Final Result      1. No evidence for extravasation of the contrast to indicate a leak.   2. Small hiatal hernia.   3. Limited study due to patient's inability to tolerate Gastrografin   contrast.      CT ABDOMEN PELVIS W IV CONTRAST Additional Contrast? Oral   Final Result   There is evidence for free air   Ascites   Findings  compatible with cirrhosis    Diverticulosis   Findings were called to Dr. Delories Heinz at the time of dictation.         XR Chest Portable   Final Result   ALERT:  THIS IS AN ABNORMAL REPORT   Findings communicated directly with Dr. Dorinda Hill at approximately   02/19/2016 1:02 PM .   1. There is curvilinear air underneath the left hemidiaphragm. This   raises concern for potential free intraperitoneal air although   findings could also reflect air within the colon or stomach. Further   evaluation with CT scan of the abdomen and pelvis is recommended.   2. Vascular calcifications thoracic aorta.        EKG:  This EKG is signed and interpreted by the EP.    Time: 12:33 PM  Rate: 102 bpm  Rhythm: Atrial fibrillation  Interpretation: atrial fibrillation (chronic) with rvr, prolonged QT interval. Septal infarct, age undermined. ST & T-wave abnormality, consider lateral ischemia.   Comparison: no changes compared to previous EKG from 01/19/15    ------------------------- NURSING NOTES AND VITALS REVIEWED ---------------------------   The nursing notes within the ED encounter and vital signs as below have been reviewed.   BP (!) 105/51    Pulse 67    Temp 98.2 ??F (36.8 ??C) (Oral)    Resp 16    Ht 5\' 4"  (1.626 m)    Wt 156 lb 9.6 oz (71 kg)    SpO2 100%    BMI 26.88 kg/m??   Oxygen Saturation Interpretation: Normal    ---------------------------------------------------PHYSICAL EXAM--------------------------------------      Constitutional/General: Alert and oriented x3, well appearing, mild distress  Head: NC/AT  Eyes: PERRL, EOMI  Mouth: Oropharynx clear. Dry oral mucosal membranes.  Neck: Supple, full ROM,   Pulmonary: Lungs clear to auscultation bilaterally, no wheezes, crackles, or rhonchi. Not in respiratory distress  Cardiovascular:  Regular rate, regular rhythm, no murmurs, gallops, or rubs. 2+ distal pulses  Abdomen: Soft and non distended, +BS, RLQ tenderness to palpation, mild rebound, + voluntary guarding,but no  rigidity. No pulsatile masses appreciated  Extremities: Moves all extremities x 4. Warm and well perfused, no pretibial edema, no calf tenderness bilaterally.  Skin: warm and dry.  Neurologic: GCS 15, CN 2-12 grossly intact, no focal or neurological deficits, no meningeal signs.  Psych: Normal Affect, no signs or symptoms of depression or psychosis.      ------------------------------ ED COURSE/MEDICAL DECISION MAKING----------------------  Medications   sodium chloride flush 0.9 % injection (  Not Given 02/19/16 2223)   0.9 % sodium chloride bolus (0 mLs Intravenous Stopped 02/19/16 1419)   ondansetron (ZOFRAN) injection 4 mg (4 mg Intravenous Given 02/19/16 1316)   famotidine (PEPCID) injection 20 mg (20 mg Intravenous Given 02/19/16 1316)   cefTRIAXone (ROCEPHIN) 1 g in sterile water 10 mL IV syringe (0 g Intravenous Stopped 02/19/16 1640)   metronidazole (FLAGYL) 500 mg in NaCl 100 mL IVPB premix (0 mg Intravenous Stopped 02/19/16 1638)   iopamidol (ISOVUE-370) 76 % injection 110 mL (110 mLs Intravenous Given 02/19/16 1424)   iohexol (OMNIPAQUE 240) injection 50 mL (50 mLs Oral Given 02/19/16 1420)   0.9 % sodium chloride bolus (0 mLs Intravenous Stopped 02/19/16 2110)   haloperidol lactate (HALDOL) injection 5 mg (5 mg Intravenous Given 02/19/16 2300)   furosemide (LASIX) injection 20 mg (20 mg Intravenous Given 02/22/16 1544)   potassium chloride 10 mEq/100 mL IVPB (Peripheral Line) (10 mEq Intravenous New Bag 02/22/16 1729)   furosemide (LASIX) injection 20 mg (20 mg Intravenous Given 02/23/16 1739)   albumin human 5 % bottle 25 g (25 g Intravenous New Bag 02/24/16 1011)   potassium chloride (KLOR-CON M) extended release tablet 20 mEq (20 mEq Oral Given 02/25/16 1338)   potassium chloride (KLOR-CON M) extended release tablet 40 mEq (40 mEq Oral Given 02/26/16 0610)       Medical Decision Making:   Differential Diagnoses:  UTI, Appendicitis, Bowel Obstruction, IBS, IBD, Mesenteric Adenitis, Ischemic Bowel,  Perforated Viscus, to name a few.      Re-evaluation:  Time: 13:10 Patient is stable. Discussed results and any questions or concerns were answered. Upon re-examination, patient is resting, NAD.  Patient???s symptoms are improving    Consultations:   Time: 1:57 PM.   Spoke with Dr. Elyse Jarvis (Surgery).  Discussed case.  He will admit this patient and will provide consultation. Dr. Elyse Jarvis asked that the Surgical Resident be paged and this was done immediately.    Time: 3:09 PM.   Spoke with Surgical Resident (Surgery) via the Surgical RN.  Discussed case.  They will come to the ED to evaluate this patient.    Counseling:  The emergency provider has spoken with the patient, daughter, and granddaughter and discussed today???s results, in addition to providing specific details for the plan of care and counseling regarding the diagnosis and prognosis. Questions are answered at this time and they are agreeable with the plan.    Critical Care Time:  Please note that the withdrawal or failure to initiate urgent interventions for this patient would likely result in a life threatening deterioration or permanent disability.  Accordingly this patient received 30 minutes of critical care time, excluding separately billable procedures.    --------------------------------- IMPRESSION AND DISPOSITION ---------------------------------    IMPRESSION  1. Perforated viscus    2. Acute cystitis without hematuria    3.  Generalized abdominal pain        DISPOSITION  Disposition: Admit to med/surg floor  Patient condition is stable      02/19/16, 12:34 PM.    This note is prepared by Roselie Awkward, acting as Scribe for Delories Heinz, MD.    Delories Heinz, MD:  The scribe's documentation has been prepared under my direction and personally reviewed by me in its entirety.  I confirm that the note above accurately reflects all work, treatment, procedures, and medical decision making performed by me.         Delories Heinz, MD  02/27/16 [phone]

## 2016-02-19 NOTE — Progress Notes (Signed)
Nursing Transfer Note    Data:  Summary of patients progress: post surgical  Reason for transfer: admission    Action:  Explained reason for transfer to Patient.  Report given to: Herbert SetaHeather, using Air traffic controllerN Handoff Navigator.  Mode of transportation: Bed    Response:  RN Recommendations: Fluconazole started in PACU, Lactic acid level 6.6, AOx0.Vital signs stable.

## 2016-02-19 NOTE — Brief Op Note (Signed)
Brief Postoperative Note    [name]  Date of Birth:  31-Jan-1928  [id number]    Pre-operative Diagnosis: perforated viscus     Post-operative Diagnosis: perforated pre-pyloric ulcer     Procedure: laparoscopic Cheree DittoGraham patch of gastric ulcer     Anesthesia: General    Surgeons/Assistants: Yurich/Keyera Hattabaugh/Konik    Estimated Blood Loss: less than 50     Complications: None    Specimens: Was Not Obtained    Findings: large pre-pyloric gastric ulcer     Electronically signed by Duncan DullHeather Ivianna Notch, DO on 02/19/2016 at 6:58 PM

## 2016-02-19 NOTE — Anesthesia Pre-Procedure Evaluation (Signed)
Department of Anesthesiology  Preprocedure Note       Name:  [name]   Age:  82 y.o.  DOB:  06/02/1927                                          MRN:  [id number]         Date:  02/19/2016      Surgeon:    Procedure:diag lap    Medications prior to admission:   Prior to Admission medications    Medication Sig Start Date End Date Taking? Authorizing Provider   albuterol sulfate HFA 108 (90 BASE) MCG/ACT inhaler Inhale 2 puffs into the lungs every 6 hours as needed for Wheezing (last took this am)    Historical Provider, MD   omeprazole (PRILOSEC) 40 MG delayed release capsule Take 1 capsule by mouth daily 02/28/15   Erling Conte, MD   MULTIPLE VITAMIN PO Take by mouth daily    Historical Provider, MD   buPROPion [facility] SR) 150 MG extended release tablet Take 150 mg by mouth nightly    Historical Provider, MD   donepezil (ARICEPT) 10 MG tablet Take 10 mg by mouth nightly    Historical Provider, MD   omeprazole (PRILOSEC) 20 MG delayed release capsule Take 2 capsules by mouth 2 times daily for 14 days 01/19/15 02/02/15  Delories Heinz, MD   simvastatin (ZOCOR) 20 MG tablet Take 20 mg by mouth nightly.      Historical Provider, MD   levothyroxine (SYNTHROID) 50 MCG tablet Take 50 mcg by mouth daily.      Historical Provider, MD   citalopram (CELEXA) 20 MG tablet Take 20 mg by mouth daily Instructed to take with sip water am of procedure    Historical Provider, MD   montelukast (SINGULAIR) 10 MG tablet Take 10 mg by mouth nightly.      Historical Provider, MD   raloxifene (EVISTA) 60 MG tablet Take 60 mg by mouth daily.      Historical Provider, MD   pregabalin (LYRICA) 150 MG capsule Take 150 mg by mouth 2 times daily Instructed to take with sip water am of procedure    Historical Provider, MD   digoxin (LANOXIN) 0.125 MG tablet Take 125 mcg by mouth every 48 hours Takes at night every other day    Historical Provider, MD   dabigatran (PRADAXA) 150 MG capsule Take 150 mg by mouth 2 times daily.      Historical  Provider, MD   ipratropium-albuterol (DUONEB) 0.5-2.5 (3) MG/3ML SOLN nebulizer solution Inhale 1 vial into the lungs every 4 hours Uses prn / instructed if needs to use dos    Historical Provider, MD       Current medications:    Current Facility-Administered Medications   Medication Dose Route Frequency Provider Last Rate Last Dose   ??? 0.9 % sodium chloride infusion  500 mL Intravenous Continuous Delories Heinz, MD 100 mL/hr at 02/19/16 1419 500 mL at 02/19/16 1419   ??? cefTRIAXone (ROCEPHIN) 1 g in sterile water 10 mL IV syringe  1 g Intravenous Once Delories Heinz, MD       ??? metronidazole (FLAGYL) 500 mg in NaCl 100 mL IVPB premix  500 mg Intravenous Once Delories Heinz, MD 100 mL/hr at 02/19/16 1505 500 mg at 02/19/16 1505     Current Outpatient Prescriptions   Medication  Sig Dispense Refill   ??? albuterol sulfate HFA 108 (90 BASE) MCG/ACT inhaler Inhale 2 puffs into the lungs every 6 hours as needed for Wheezing (last took this am)     ??? omeprazole (PRILOSEC) 40 MG delayed release capsule Take 1 capsule by mouth daily 30 capsule 3   ??? MULTIPLE VITAMIN PO Take by mouth daily     ??? buPROPion (WELLBUTRIN SR) 150 MG extended release tablet Take 150 mg by mouth nightly     ??? donepezil (ARICEPT) 10 MG tablet Take 10 mg by mouth nightly     ??? omeprazole (PRILOSEC) 20 MG delayed release capsule Take 2 capsules by mouth 2 times daily for 14 days 28 capsule 0   ??? simvastatin (ZOCOR) 20 MG tablet Take 20 mg by mouth nightly.       ??? levothyroxine (SYNTHROID) 50 MCG tablet Take 50 mcg by mouth daily.       ??? citalopram (CELEXA) 20 MG tablet Take 20 mg by mouth daily Instructed to take with sip water am of procedure     ??? montelukast (SINGULAIR) 10 MG tablet Take 10 mg by mouth nightly.       ??? raloxifene (EVISTA) 60 MG tablet Take 60 mg by mouth daily.       ??? pregabalin (LYRICA) 150 MG capsule Take 150 mg by mouth 2 times daily Instructed to take with sip water am of procedure     ??? digoxin (LANOXIN) 0.125 MG tablet Take 125  mcg by mouth every 48 hours Takes at night every other day     ??? dabigatran (PRADAXA) 150 MG capsule Take 150 mg by mouth 2 times daily.       ??? ipratropium-albuterol (DUONEB) 0.5-2.5 (3) MG/3ML SOLN nebulizer solution Inhale 1 vial into the lungs every 4 hours Uses prn / instructed if needs to use dos         Allergies:    Allergies   Allergen Reactions   ??? Aspirin      Reaction unknown   ??? Eggs Or Egg-Derived Products Nausea And Vomiting   ??? Sulfa Antibiotics Nausea And Vomiting       Problem List:  There is no problem list on file for this patient.      Past Medical History:        Diagnosis Date   ??? A-fib (HCC)    ??? Anticoagulated     on pradaxa   ??? Asthma    ??? COPD (chronic obstructive pulmonary disease) (HCC)    ??? Depression    ??? Forgetfulness     capable of signing own consents   ??? GERD (gastroesophageal reflux disease)    ??? HOH (hard of hearing)     no aides   ??? Hyperlipidemia    ??? Pain     bilateral upper quadrant /  for EGD   ??? Restless leg syndrome    ??? Thyroid disease        Past Surgical History:        Procedure Laterality Date   ??? BACK SURGERY  2010?    lumbar    ??? BREAST SURGERY      Lumpectomy   ??? ENDOSCOPY, COLON, DIAGNOSTIC  02/28/2015   ??? HYSTERECTOMY  1970's   ??? JOINT REPLACEMENT Bilateral 2011?    knee   ??? VARICOSE VEIN SURGERY Bilateral 2012       Social History:    Social History   Substance Use Topics   ???  Smoking status: Never Smoker   ??? Smokeless tobacco: Not on file   ??? Alcohol use No                                Counseling given: Not Answered      Vital Signs (Current):   Vitals:    02/19/16 1205 02/19/16 1452   BP: (!) 147/76 (!) 140/83   Pulse: 103 106   Resp: 16 16   Temp: 98.2 ??F (36.8 ??C) 97.7 ??F (36.5 ??C)   TempSrc:  Axillary   SpO2: 99% 98%   Weight: 181 lb (82.1 kg)    Height: 5\' 4"  (1.626 m)                                               BP Readings from Last 3 Encounters:   02/19/16 (!) 140/83   02/28/15 (!) 101/56   01/19/15 123/72       NPO Status:  >8hrs                                                                                BMI:   Wt Readings from Last 3 Encounters:   02/19/16 181 lb (82.1 kg)   02/28/15 181 lb (82.1 kg)   01/19/15 167 lb (75.8 kg)     Body mass index is 31.07 kg/m??.    Anesthesia Evaluation    Airway: Mallampati: III  TM distance: >3 FB   Neck ROM: full  Mouth opening: > = 3 FB Dental:      Comment: Poor condition  Missing many    Pulmonary:   (+) COPD:  decreased breath sounds,  asthma:                            Cardiovascular:  Exercise tolerance: poor (<4 METS),   (+) dysrhythmias: atrial fibrillation, hyperlipidemia        Rhythm: irregular             Beta Blocker:  Not on Beta Blocker         Neuro/Psych:   (+) psychiatric history:               Comments: dementia GI/Hepatic/Renal:   (+) GERD:,           Endo/Other:    (+) hypothyroidism, blood dyscrasia: anticoagulation therapy:.                 Abdominal:           Vascular:                                  Anesthesia Plan      general     ASA 3 - emergent           MIPS: Prophylactic antiemetics administered.  Anesthetic plan and risks discussed with healthcare power of attorney and  child/children.                      Jeni Salles, MD   02/19/2016      DOS STAFF ADDENDUM:    Patient seen and examined, physical exam updated as needed, chart reviewed.    NPO status confirmed.    Anesthetic options and risks discussed with patient/legal guardian.  Patient/legal guardian verbalized understanding and agrees to proceed.     Jeni Salles, MD  Staff Anesthesiologist  February 19, 2016  3:52 PM

## 2016-02-19 NOTE — Other (Signed)
Spoke with Dr Georgeanna HarrisonBoulos re: pt arrival to ICU and subsequent confusion and agitation. Upon transferring pt into ICU bed, the pt became agitated, hitting and kicking staff while attempting to get oob. She pulled out her NGT and one of mthe IVs. The NGT and the IV site was unable to be replaced at this time. Unable to calm patient ans reason with her. Sitter was placed at the bedside but has not proven successful in calming the patient thusfar. Order rec'd for restraints and for a dose of IV Haldol if pt should continue to be agitated and combative. Family was here and present for the above. They are aware of and agreeable to patient being restrained.

## 2016-02-19 NOTE — Other (Signed)
Patient admitted from PACU to 211, placed on monitor, attempted to orient patient to room and unit visiting hours but pt was unresponsive to education offered since she has a history of dementia and was quite agitated and combative at the time. Daughter and grandaughter were responsive to education offered and verbalized understanding. Patient guide at bedside, reviewed patient rights and responsibilities. MRSA nasal swab was unable to be obtained upon arrival to room but will reattempt once pt is less combative and doesn't refuse. Bed alarm on.  Call light within reach.

## 2016-02-19 NOTE — H&P (Signed)
GENERAL SURGERY  H&P NOTE  02/19/2016        HPI  [name] is a 82 y.o. female who presents for evaluation of diffuse abdominal pain for one day. Acute, sharp abdominal pain that woke patient from her sleep. Poor historian. Hx of afib on Pradaxa. EGD 02/2015 showing hiatal hernia, Schatzki ring. Nausea and vomiting. Passing flatus, denied bowel movement. She denied fever, chills, cp, sob, melena.       Past Medical History:   Diagnosis Date   ??? A-fib (HCC)    ??? Anticoagulated     on pradaxa   ??? Asthma    ??? COPD (chronic obstructive pulmonary disease) (HCC)    ??? Depression    ??? Forgetfulness     capable of signing own consents   ??? GERD (gastroesophageal reflux disease)    ??? HOH (hard of hearing)     no aides   ??? Hyperlipidemia    ??? Pain     bilateral upper quadrant /  for EGD   ??? Restless leg syndrome    ??? Thyroid disease        Past Surgical History:   Procedure Laterality Date   ??? BACK SURGERY  2010?    lumbar    ??? BREAST SURGERY      Lumpectomy   ??? ENDOSCOPY, COLON, DIAGNOSTIC  02/28/2015   ??? HYSTERECTOMY  1970's   ??? JOINT REPLACEMENT Bilateral 2011?    knee   ??? VARICOSE VEIN SURGERY Bilateral 2012       Medications Prior to Admission:    Prior to Admission medications    Medication Sig Start Date End Date Taking? Authorizing Provider   albuterol sulfate HFA 108 (90 BASE) MCG/ACT inhaler Inhale 2 puffs into the lungs every 6 hours as needed for Wheezing (last took this am)    Historical Provider, MD   omeprazole (PRILOSEC) 40 MG delayed release capsule Take 1 capsule by mouth daily 02/28/15   Erling ConteJoseph A Ambrose, MD   MULTIPLE VITAMIN PO Take by mouth daily    Historical Provider, MD   buPROPion [facility](WELLBUTRIN SR) 150 MG extended release tablet Take 150 mg by mouth nightly    Historical Provider, MD   donepezil (ARICEPT) 10 MG tablet Take 10 mg by mouth nightly    Historical Provider, MD   omeprazole (PRILOSEC) 20 MG delayed release capsule Take 2 capsules by mouth 2 times daily for 14 days 01/19/15 02/02/15  Delories Heinzaryl  Donald, MD   simvastatin (ZOCOR) 20 MG tablet Take 20 mg by mouth nightly.      Historical Provider, MD   levothyroxine (SYNTHROID) 50 MCG tablet Take 50 mcg by mouth daily.      Historical Provider, MD   citalopram (CELEXA) 20 MG tablet Take 20 mg by mouth daily Instructed to take with sip water am of procedure    Historical Provider, MD   montelukast (SINGULAIR) 10 MG tablet Take 10 mg by mouth nightly.      Historical Provider, MD   raloxifene (EVISTA) 60 MG tablet Take 60 mg by mouth daily.      Historical Provider, MD   pregabalin (LYRICA) 150 MG capsule Take 150 mg by mouth 2 times daily Instructed to take with sip water am of procedure    Historical Provider, MD   digoxin (LANOXIN) 0.125 MG tablet Take 125 mcg by mouth every 48 hours Takes at night every other day    Historical Provider, MD   dabigatran (PRADAXA)  150 MG capsule Take 150 mg by mouth 2 times daily.      Historical Provider, MD   ipratropium-albuterol (DUONEB) 0.5-2.5 (3) MG/3ML SOLN nebulizer solution Inhale 1 vial into the lungs every 4 hours Uses prn / instructed if needs to use dos    Historical Provider, MD       Allergies   Allergen Reactions   ??? Aspirin      Reaction unknown   ??? Eggs Or Egg-Derived Products Nausea And Vomiting   ??? Sulfa Antibiotics Nausea And Vomiting       History reviewed. No pertinent family history.    Social History   Substance Use Topics   ??? Smoking status: Never Smoker   ??? Smokeless tobacco: Not on file   ??? Alcohol use No         Review of Systems   General ROS: negative  Hematological and Lymphatic ROS: negative  Respiratory ROS: no cough, shortness of breath, or wheezing  Cardiovascular ROS: no chest pain or dyspnea on exertion  Gastrointestinal ROS: positive for - abdominal pain  Genito-Urinary ROS: no dysuria, trouble voiding, or hematuria  Musculoskeletal ROS: negative      PHYSICAL EXAM:    Vitals:    02/19/16 1452   BP: (!) 140/83   Pulse: 106   Resp: 16   Temp: 97.7 ??F (36.5 ??C)   SpO2: 98%       General:  NAD, awake and alert.   Head: Normocephalic, atraumatic  Eyes: PERRLA, EOMI.   Lungs: No increased work of breathing. C  Cardiovascular: Tachycardic  Abdomen: Soft, distended, diffuse TTP. Diffuse rebound tenderness, involuntary guarding.  Extremities: Atraumatic, full range of motion  Skin: Warm, dry and intact    LABS:  CBC  Recent Labs      02/19/16   1300   WBC  19.2*   HGB  13.8   HCT  40.7   PLT  543*     BMP  Recent Labs      02/19/16   1300   NA  138   K  3.0*   CL  94*   CO2  21*   BUN  14   CREATININE  0.8   CALCIUM  9.2     Liver Function  Recent Labs      02/19/16   1300   AMYLASE  98   LIPASE  116*   BILITOT  1.9*   AST  73*   ALT  59*   ALKPHOS  154*   PROT  6.4   LABALBU  2.9*     No results for input(s): LACTATE in the last 72 hours.  Recent Labs      02/19/16   1300   INR  1.6       RADIOLOGY  Ct Abdomen Pelvis W Iv Contrast Additional Contrast? Oral    Result Date: 02/19/2016  Patient MRN:  [id number] DOB: 04/05/1928 Age: 82 years Gender: Female Order Date:  02/19/2016 2:30 PM EXAM: CT ABDOMEN PELVIS W IV CONTRAST NUMBER OF IMAGES \\ views:  325 INDICATION:  abdominal pain COMPARISON: None Technique: Low-dose CT  acquisition technique included one of following options; 1 . Automated exposure control, 2. Adjustment of MA and or KV according to patient's size or 3. Use of iterative reconstruction. Multiple computerized tomography sections of the abdomen with sagittal and coronal MPR reconstructions were obtained from the top of the diaphragm to the pelvis.  The visualized portions of the abdomen  reveal: The lung bases are unremarkable. The liver is unremarkable. The spleen is unremarkable. The kidneys are unremarkable.  The adrenals is  unremarkable. The pancreas is unremarkable. The appendix is not seen The bowel is  abnormal. There is evidence for free air. There is evidence for cirrhosis. There is evidence for C ascites. There is pneumomediastinum extending along the distal stomach. There is evidence  for diverticulosis. There is a large hiatal hernia The bladder is unremarkable.     There is evidence for free air Ascites Findings compatible with cirrhosis Diverticulosis Findings were called to Dr. Delories Heinz at the time of dictation.     Xr Chest Portable    Result Date: 02/19/2016  Patient MRN: [id number] DOB: Apr 22, 1928 Age:  77 years Gender: Female Order Date: 02/19/2016 12:57 PM Exam: XR CHEST PORTABLE Number of Views: 1 Indication:   Chest pain Comparison: 09/07/2006 Findings: There is a stable, enlarged cardiomediastinal silhouette with curvilinear air underneath the left hemidiaphragm. Vascular calcifications thoracic aorta.. No pneumothorax.Marland Kitchen     ALERT:  THIS IS AN ABNORMAL REPORT Findings communicated directly with Dr. Dorinda Hill at approximately 02/19/2016 1:02 PM . 1. There is curvilinear air underneath the left hemidiaphragm. This raises concern for potential free intraperitoneal air although findings could also reflect air within the colon or stomach. Further evaluation with CT scan of the abdomen and pelvis is recommended. 2. Vascular calcifications thoracic aorta.        ASSESSMENT:  81 y.o. female with pneumoperitoneum, possible secondary to perforated diverticuli    PLAN:  Will proceed with diagnostic laparoscopy, possible exploratory laparotomy. Risk, benefits and alternatives discussed with the patient's daughter, who understood and wished to proceed with the procedure.   Received Rocephin/Flagyl in ED  NPO  IVF's  Pain and nausea control    D/w Dr. Elyse Jarvis    Electronically signed by Augusto Gamble, MD on 02/19/16 at 3:48 PM

## 2016-02-19 NOTE — ED Notes (Signed)
Patient in CT.     Talmage NapJennifer A Tanairi Cypert, RN  02/19/16 1419

## 2016-02-19 NOTE — ED Notes (Signed)
Dr Elyse Jarvisyurich at bedside to talk with patient/daughter.     Talmage NapJennifer A Marsden Zaino, RN  02/19/16 [phone]

## 2016-02-19 NOTE — Op Note (Signed)
[facility]                 [address] [place], [place] [id number]                               OPERATIVE REPORT    PATIENT NAME: [name], Michelle E                      DOB:             [birth year]  MED REC NO:   [id number]                             ROOM:            0211  ACCOUNT NO:   [phone]                           ADMISSION DATE:  02/19/2016  PROVIDER:     Duncan DullHeather Girard Koontz, DO    DATE OF PROCEDURE:  02/19/2016    PREOPERATIVE DIAGNOSIS:  Perforated viscus.    POSTOPERATIVE DIAGNOSIS:  Perforated prepyloric gastric ulcer.    PROCEDURE PERFORMED:  Laparoscopic repair of perforated ulcer with  omental Cheree DittoGraham patch and a falciform Graham patch.    SURGEON:  Willadean CarolJoseph Yurich, MD.    ASSISTANT:  Duncan DullHeather Tykisha Areola, DO and Letitia Librayan Konik, MD    DESCRIPTION OF PROCEDURE:  The patient was brought into the operating  room, placed supine on the operating table.  She had been given  antibiotics preoperatively and general anesthesia was induced.  The  abdomen was then prepped and draped in the sterile fashion.  A stab  incision was made in the left subcostal region.  A Veress needle was  used to gain access into the abdomen.  Once the abdomen was  insufflated to 50 mmHg, 5 mm supraumbilical port was placed and a  camera was introduced.  Abdomen was inspected and there was noted to  be mostly tubid ascites with some fibrinous exudate mostly in the upper  abdomen as well as some lower midline adhesions.  Two more 5-mm ports  were placed in the left upper quadrant.  Graspers were used to elevate  the liver and there was noted to be a large prepyloric perforation.   Another 10 mm port was placed in the right mid abdomen as well as a 5  mm port in the subxiphoid region using the liver retractor.  The  abdomen was suctioned of any of the gastric contents.  Three separate  2-0 Vicryl sutures were then used in an interrupted fashion around the  ulcer with tails left long.  Omentum was then  brought up, placed over the ulcer and the tails  were tied to secure the omental patch.  The falciform was then taken  down off the anterior abdominal wall using a LigaSure device.  This  was then tacked in place at the superior portion of the perforation as  well with the 2-0 Vicryl suture.  Once we were satisfied with our  [facility], the abdomen was then copiously irrigated with saline and  suctioned until clean.  A 19-French JP drain was then thread through  the left upper quadrant port and around and underneath the liver.  The  abdomen was desufflated, 10 mm port  site was closed with 0-Vicryl  suture.  The skin incisions were closed with 4-O Monocryl sutures and  Skin Affix was placed.  The patient was then awoken and extubated in  the operating room without any difficulty and transferred to PACU.        Annalaya Wile, DO    D: 02/19/2016 19:07:03       T: 02/19/2016 21:56:50     [id number]  Job#: [id number]     Doc#: [id number]

## 2016-02-19 NOTE — Anesthesia Post-Procedure Evaluation (Signed)
[facility]              POST-OP NOTE      NAME:  Michelle FillerRuth E Mccullers                                         AGE: 82 y.o.  MRN:    [id number]     Last Vitals:  BP 113/70    Pulse 123    Temp 96.8 ??F (36 ??C) (Temporal)    Resp 22    Ht 5\' 4"  (1.626 m)    Wt 181 lb (82.1 kg)    SpO2 96%    BMI 31.07 kg/m??     Last Aldrete Score:  Phase I Aldrete Score:  Aldrete Score Phase 1  Activity: Able to move four extremities voluntarily or on command  Respiration: Able to breath and cough freely  Circulation: Blood pressure within 20% of preanesthesia level  Consciousness: Fully awake  Oxygen: SAO2 > 90% with O2  Aldrete Score: 9    Phase II Aldrete Score:       Last Pain Score Documented: (in Doc Flowsheet)  Pain Level: 8      Level of consciousness: awake    Airway Patency/Respiratory:  stable    Oxygen Saturation:  stable    Cardiovascular: stable    Hydration: adequate    PONV: stable    Post-op pain: adequate analgesia    Post-op assessment: no apparent anesthetic complications    Additional Follow-Up / Treatment / Comment: none    Jeni Sallesonald F Mullis Jr, MD  Staff Anesthesiologist  February 19, 2016 10:16 PM

## 2016-02-20 DIAGNOSIS — R198 Other specified symptoms and signs involving the digestive system and abdomen: Principal | ICD-10-CM

## 2016-02-20 LAB — CALCIUM, IONIZED: Calcium, Ionized: 1.18 mmol/L (ref 1.15–1.33)

## 2016-02-20 LAB — COMPREHENSIVE METABOLIC PANEL
ALT: 37 U/L — ABNORMAL HIGH (ref 0–32)
ALT: 49 U/L — ABNORMAL HIGH (ref 0–32)
AST: 48 U/L — ABNORMAL HIGH (ref 0–31)
AST: 69 U/L — ABNORMAL HIGH (ref 0–31)
Albumin: 2.2 g/dL — ABNORMAL LOW (ref 3.5–5.2)
Albumin: 2.3 g/dL — ABNORMAL LOW (ref 3.5–5.2)
Alkaline Phosphatase: 105 U/L — ABNORMAL HIGH (ref 35–104)
Alkaline Phosphatase: 115 U/L — ABNORMAL HIGH (ref 35–104)
Anion Gap: 14 mmol/L (ref 7–16)
Anion Gap: 17 mmol/L — ABNORMAL HIGH (ref 7–16)
BUN: 12 mg/dL (ref 8–23)
BUN: 12 mg/dL (ref 8–23)
CO2: 21 mmol/L — ABNORMAL LOW (ref 22–29)
CO2: 24 mmol/L (ref 22–29)
Calcium: 7.5 mg/dL — ABNORMAL LOW (ref 8.6–10.2)
Calcium: 7.8 mg/dL — ABNORMAL LOW (ref 8.6–10.2)
Chloride: 107 mmol/L (ref 98–107)
Chloride: 99 mmol/L (ref 98–107)
Creatinine: 0.6 mg/dL (ref 0.5–1.0)
Creatinine: 0.8 mg/dL (ref 0.5–1.0)
GFR African American: >60
GFR African American: >60
GFR Non-African American: >60 mL/min/{1.73_m2} (ref >=60)
GFR Non-African American: >60 mL/min/{1.73_m2} (ref >=60)
Glucose: 123 mg/dL — ABNORMAL HIGH (ref 74–109)
Glucose: 128 mg/dL — ABNORMAL HIGH (ref 74–109)
Potassium: 3.6 mmol/L (ref 3.5–5.0)
Potassium: 4.3 mmol/L (ref 3.5–5.0)
Sodium: 137 mmol/L (ref 132–146)
Sodium: 145 mmol/L (ref 132–146)
Total Bilirubin: 0.8 mg/dL (ref 0.0–1.2)
Total Bilirubin: 0.8 mg/dL (ref 0.0–1.2)
Total Protein: 4.9 g/dL — ABNORMAL LOW (ref 6.4–8.3)
Total Protein: 5.2 g/dL — ABNORMAL LOW (ref 6.4–8.3)

## 2016-02-20 LAB — CBC WITH AUTO DIFFERENTIAL
Basophils %: 0.2 % (ref 0.0–2.0)
Basophils Absolute: 0.06 E9/L (ref 0.00–0.20)
Eosinophils %: 0 % (ref 0.0–6.0)
Eosinophils Absolute: 0 E9/L — ABNORMAL LOW (ref 0.05–0.50)
Hematocrit: 35.1 % (ref 34.0–48.0)
Hemoglobin: 11.6 g/dL (ref 11.5–15.5)
Immature Granulocytes #: 0.18 E9/L
Immature Granulocytes %: 0.6 % (ref 0.0–5.0)
Lymphocytes %: 3.2 % — ABNORMAL LOW (ref 20.0–42.0)
Lymphocytes Absolute: 0.99 E9/L — ABNORMAL LOW (ref 1.50–4.00)
MCH: 31.8 pg (ref 26.0–35.0)
MCHC: 33 % (ref 32.0–34.5)
MCV: 96.2 fL (ref 80.0–99.9)
MPV: 9.8 fL (ref 7.0–12.0)
Monocytes %: 5.2 % (ref 2.0–12.0)
Monocytes Absolute: 1.59 E9/L — ABNORMAL HIGH (ref 0.10–0.95)
Neutrophils %: 90.8 % — ABNORMAL HIGH (ref 43.0–80.0)
Neutrophils Absolute: 27.73 E9/L — ABNORMAL HIGH (ref 1.80–7.30)
Platelets: 402 E9/L (ref 130–450)
RBC: 3.65 E12/L (ref 3.50–5.50)
RDW: 15.3 fL — ABNORMAL HIGH (ref 11.5–15.0)
WBC: 30.6 E9/L — ABNORMAL HIGH (ref 4.5–11.5)

## 2016-02-20 LAB — LACTIC ACID
Lactic Acid: 6.6 mmol/L (ref 0.5–2.2)
Lactic Acid: 4.5 mmol/L (ref 0.5–2.2)
Lactic Acid: 3.1 mmol/L — ABNORMAL HIGH (ref 0.5–2.2)
Lactic Acid: 2.2 mmol/L (ref 0.5–2.2)

## 2016-02-20 LAB — PHOSPHORUS: Phosphorus: 3.1 mg/dL (ref 2.5–4.5)

## 2016-02-20 LAB — CULTURE, URINE

## 2016-02-20 LAB — MAGNESIUM: Magnesium: 1.8 mg/dL (ref 1.6–2.6)

## 2016-02-20 MED ORDER — NORMAL SALINE FLUSH 0.9 % IV SOLN
0.9 % | Freq: Two times a day (BID) | INTRAVENOUS | Status: DC
Start: 2016-02-20 — End: 2016-02-26
  Administered 2016-02-20 – 2016-02-26 (×8): 10 mL via INTRAVENOUS

## 2016-02-20 MED ORDER — HALOPERIDOL LACTATE 5 MG/ML IJ SOLN
5 MG/ML | Freq: Once | INTRAMUSCULAR | Status: DC
Start: 2016-02-20 — End: 2016-02-19

## 2016-02-20 MED ORDER — PANTOPRAZOLE SODIUM 40 MG IV SOLR
40 MG | Freq: Two times a day (BID) | INTRAVENOUS | Status: DC
Start: 2016-02-20 — End: 2016-02-26
  Administered 2016-02-20 – 2016-02-26 (×14): 40 mg via INTRAVENOUS

## 2016-02-20 MED ORDER — SODIUM CHLORIDE 0.9 % IV SOLN
0.9 % | INTRAVENOUS | Status: DC
Start: 2016-02-20 — End: 2016-02-24
  Administered 2016-02-20 – 2016-02-21 (×4): via INTRAVENOUS

## 2016-02-20 MED ORDER — NORMAL SALINE FLUSH 0.9 % IV SOLN
0.9 % | INTRAVENOUS | Status: DC | PRN
Start: 2016-02-20 — End: 2016-02-26
  Administered 2016-02-20 (×2): 10 mL via INTRAVENOUS

## 2016-02-20 MED ORDER — ACETAMINOPHEN 325 MG PO TABS
325 MG | ORAL | Status: DC | PRN
Start: 2016-02-20 — End: 2016-02-26

## 2016-02-20 MED ORDER — INFLUENZA VAC SUBUNIT QUAD 0.5 ML IM SUSY
0.5 ML | INTRAMUSCULAR | Status: DC
Start: 2016-02-20 — End: 2016-02-26

## 2016-02-20 MED ORDER — MORPHINE SULFATE 2 MG/ML IJ SOLN
2 MG/ML | INTRAMUSCULAR | Status: DC | PRN
Start: 2016-02-20 — End: 2016-02-25
  Administered 2016-02-20 – 2016-02-25 (×7): 2 mg via INTRAVENOUS

## 2016-02-20 MED ORDER — ENOXAPARIN SODIUM 40 MG/0.4ML SC SOLN
40 MG/0.4ML | Freq: Every day | SUBCUTANEOUS | Status: DC
Start: 2016-02-20 — End: 2016-02-26
  Administered 2016-02-20 – 2016-02-26 (×6): 40 mg via SUBCUTANEOUS

## 2016-02-20 MED ORDER — PIPERACILLIN-TAZOBACTAM 3.375 G IN 50 ML (PREMIX) IVPB EXTENDED
Freq: Three times a day (TID) | INTRAVENOUS | Status: DC
Start: 2016-02-20 — End: 2016-02-21
  Administered 2016-02-20 – 2016-02-21 (×6): 3.375 g via INTRAVENOUS

## 2016-02-20 MED ORDER — HALOPERIDOL LACTATE 5 MG/ML IJ SOLN
5 MG/ML | Freq: Once | INTRAMUSCULAR | Status: AC
Start: 2016-02-20 — End: 2016-02-19
  Administered 2016-02-20: 03:00:00 5 mg via INTRAVENOUS

## 2016-02-20 MED ORDER — ONDANSETRON HCL 4 MG/2ML IJ SOLN
4 MG/2ML | Freq: Four times a day (QID) | INTRAMUSCULAR | Status: DC | PRN
Start: 2016-02-20 — End: 2016-02-26
  Administered 2016-02-25 – 2016-02-26 (×3): 4 mg via INTRAVENOUS

## 2016-02-20 MED ORDER — MORPHINE SULFATE 2 MG/ML IJ SOLN
2 MG/ML | INTRAMUSCULAR | Status: DC | PRN
Start: 2016-02-20 — End: 2016-02-25
  Administered 2016-02-20 (×2): 4 mg via INTRAVENOUS

## 2016-02-20 MED ORDER — SODIUM CHLORIDE 0.9 % IJ SOLN
0.9 % | Freq: Two times a day (BID) | INTRAMUSCULAR | Status: DC
Start: 2016-02-20 — End: 2016-02-26
  Administered 2016-02-20 – 2016-02-26 (×13): 10 mL via INTRAVENOUS

## 2016-02-20 MED FILL — ZOSYN 3-0.375 GM/50ML IV SOLN: INTRAVENOUS | Qty: 50

## 2016-02-20 MED FILL — ENOXAPARIN SODIUM 40 MG/0.4ML SC SOLN: 40 MG/0.4ML | SUBCUTANEOUS | Qty: 0.4

## 2016-02-20 MED FILL — NORMAL SALINE FLUSH 0.9 % IV SOLN: 0.9 % | INTRAVENOUS | Qty: 10

## 2016-02-20 MED FILL — FLUCONAZOLE IN SODIUM CHLORIDE 200-0.9 MG/100ML-% IV SOLN: INTRAVENOUS | Qty: 100

## 2016-02-20 MED FILL — NORMAL SALINE FLUSH 0.9 % IV SOLN: 0.9 % | INTRAVENOUS | Qty: 40

## 2016-02-20 MED FILL — PROTONIX 40 MG IV SOLR: 40 MG | INTRAVENOUS | Qty: 40

## 2016-02-20 MED FILL — MORPHINE SULFATE 2 MG/ML IJ SOLN: 2 MG/ML | INTRAMUSCULAR | Qty: 2

## 2016-02-20 MED FILL — NORMAL SALINE FLUSH 0.9 % IV SOLN: 0.9 % | INTRAVENOUS | Qty: 60

## 2016-02-20 MED FILL — MORPHINE SULFATE 2 MG/ML IJ SOLN: 2 mg/mL | INTRAMUSCULAR | Qty: 1

## 2016-02-20 MED FILL — HALOPERIDOL LACTATE 5 MG/ML IJ SOLN: 5 MG/ML | INTRAMUSCULAR | Qty: 1

## 2016-02-20 MED FILL — MORPHINE SULFATE 2 MG/ML IJ SOLN: 2 mg/mL | INTRAMUSCULAR | Qty: 2

## 2016-02-20 MED FILL — NORMAL SALINE FLUSH 0.9 % IV SOLN: 0.9 % | INTRAVENOUS | Qty: 20

## 2016-02-20 MED FILL — ONDANSETRON HCL 4 MG/2ML IJ SOLN: 4 MG/2ML | INTRAMUSCULAR | Qty: 2

## 2016-02-20 NOTE — Progress Notes (Addendum)
Surgery Progress Note    Patient's Name/Date of Birth: Michelle FillerRuth E Scadden     Subjective: agitation overnight.  Pulled out IV/NGT.  In restraints.  Sitter bedside.  Awake and confused.  Denies pain.      Objective:  Blood pressure (!) 103/50, pulse 109, temperature 97.5 ??F (36.4 ??C), temperature source Axillary, resp. rate 20, height 5\' 4"  (1.626 m), weight 181 lb (82.1 kg), SpO2 93 %.  General appearance: no distress, confused   Lungs: nonlabored  Heart: RRR  Abdomen: soft, non-distended, incisions without erythema or drainage, mildly   MSK: no edema   Skin: warm/dry    Assessment/Plan:  [name] is a 82 y.o. female POD1 lap graham patch for perforated gastric ulcer    -PPI BID  -NGT removed by patient   -zosyn/diflucan - monitor WBC  -monitor agitation   -UO adequate       Physician Signature: Electronically signed by Duncan DullHeather Dunlap, DO  02/20/2016  7:13 AM      Will leave NGT out for naow as will only add to her confusion/agitation    Cont abx and will follow

## 2016-02-20 NOTE — Consults (Addendum)
Critical Care Admit/Consult Note         Patient - [name]   MRN -  [id number]   Acct # - [id number]  DOB - 29-May-1927      Date of Admission -  02/19/2016  3:55 PM  Date of evaluation -  02/20/2016  0211/0211-A   Hospital Day - 1          ADMIT/CONSULT DETAILS     Reason for Admit/Consult   perforated gastric ulcer     Consulting Service/Physician   Consulting - Willadean Carol, MD  Primary Care Physician - Otilio Saber, DO         HPI   The patient is a 82 y.o. female with significant past medical history of afib on pradaxa who presented for nausea, vomiting, RLQ abdominal pain. In the D she was found to have free air and peritonitis on CT abdomen and was taken for exploratory laparoscopy by Dr. Elyse Jarvis. She had a graham patch for a perforated gastric ulcer an transferred to ICU.  Over night she had some agitation and pulled out her NG and IV.     Past Medical History         Diagnosis Date   ??? A-fib (HCC)    ??? Anticoagulated     on pradaxa   ??? Asthma    ??? COPD (chronic obstructive pulmonary disease) (HCC)    ??? Depression    ??? Forgetfulness     capable of signing own consents   ??? GERD (gastroesophageal reflux disease)    ??? HOH (hard of hearing)     no aides   ??? Hyperlipidemia    ??? Pain     bilateral upper quadrant /  for EGD   ??? Restless leg syndrome    ??? Thyroid disease         Past Surgical History           Procedure Laterality Date   ??? ABDOMEN SURGERY  02/19/2016    laprascopic with graham patch of gastric ulcer   ??? BACK SURGERY  2010?    lumbar    ??? BREAST SURGERY      Lumpectomy   ??? ENDOSCOPY, COLON, DIAGNOSTIC  02/28/2015   ??? HYSTERECTOMY  1970's   ??? JOINT REPLACEMENT Bilateral 2011?    knee   ??? VARICOSE VEIN SURGERY Bilateral 2012         Current Medications   Current Medications   ??? influenza virus vaccine  0.5 mL Intramuscular Prior to discharge   ??? fluconazole  200 mg Intravenous Q24H   ??? piperacillin-tazobactam  3.375 g Intravenous Q8H   ??? sodium chloride flush  10 mL Intravenous 2 times per day    ??? enoxaparin  40 mg Subcutaneous Daily   ??? pantoprazole  40 mg Intravenous BID    And   ??? sodium chloride (PF)  10 mL Intravenous BID     sodium chloride flush, acetaminophen, morphine **OR** morphine, ondansetron  IV Drips/Infusions  ??? sodium chloride 125 mL/hr at 02/20/16 0640     Home Medications  Prescriptions Prior to Admission: albuterol sulfate HFA 108 (90 BASE) MCG/ACT inhaler, Inhale 2 puffs into the lungs every 6 hours as needed for Wheezing (last took this am)  omeprazole (PRILOSEC) 40 MG delayed release capsule, Take 1 capsule by mouth daily  MULTIPLE VITAMIN PO, Take by mouth daily  buPROPion (WELLBUTRIN SR) 150 MG extended release tablet, Take 150 mg by mouth nightly  donepezil (ARICEPT) 10 MG tablet, Take 10 mg by mouth nightly  omeprazole (PRILOSEC) 20 MG delayed release capsule, Take 2 capsules by mouth 2 times daily for 14 days  simvastatin (ZOCOR) 20 MG tablet, Take 20 mg by mouth nightly.    levothyroxine (SYNTHROID) 50 MCG tablet, Take 50 mcg by mouth daily.    citalopram (CELEXA) 20 MG tablet, Take 20 mg by mouth daily Instructed to take with sip water am of procedure  montelukast (SINGULAIR) 10 MG tablet, Take 10 mg by mouth nightly.    raloxifene (EVISTA) 60 MG tablet, Take 60 mg by mouth daily.    pregabalin (LYRICA) 150 MG capsule, Take 150 mg by mouth 2 times daily Instructed to take with sip water am of procedure  digoxin (LANOXIN) 0.125 MG tablet, Take 125 mcg by mouth every 48 hours Takes at night every other day  dabigatran (PRADAXA) 150 MG capsule, Take 150 mg by mouth 2 times daily.    ipratropium-albuterol (DUONEB) 0.5-2.5 (3) MG/3ML SOLN nebulizer solution, Inhale 1 vial into the lungs every 4 hours Uses prn / instructed if needs to use dos    Diet/Nutrition   Diet NPO Effective Now    Allergies   Aspirin; Eggs or egg-derived products; and Sulfa antibiotics    Social History   Tobacco   reports that she has never smoked. She does not have any smokeless tobacco history on  file.    Alcohol     reports that she does not drink alcohol.    Occupational history :    Family History   History reviewed. No pertinent family history.      ROS   REVIEW OF SYSTEMS:  CONSTITUTIONAL:  negative  EYES:  negative  HEENT:  negative  RESPIRATORY:  negative  CARDIOVASCULAR:  negative  GASTROINTESTINAL:  negative  MUSCULOSKELETAL:  negative  NEUROLOGICAL:  confused    Lines and Devices    2 peripheral IVs    Mechanical Ventilation Data   VENT SETTINGS (Comprehensive)     Additional Respiratory  Assessments  Pulse: 105  Resp: 20  SpO2: 98 %  Oral Care: Mouth moisturizer    ABG  No results found for: PH, PCO2, PO2, HCO3, O2SAT  No results found for: IFIO2, MODE, SETTIDVOL, SETPEEP        Vitals    height is 5\' 4"  (1.626 m) and weight is 164 lb 3.9 oz (74.5 kg). Her axillary temperature is 97.5 ??F (36.4 ??C). Her blood pressure is 126/62 and her pulse is 105. Her respiration is 20 and oxygen saturation is 98%.       Temperature Range: Temp: 97.5 ??F (36.4 ??C) Temp  Avg: 97.7 ??F (36.5 ??C)  Min: 96.8 ??F (36 ??C)  Max: 98.4 ??F (36.9 ??C)  BP Range:  Systolic (24hrs), Avg:125 , Min:96 , Max:172     Diastolic (24hrs), Avg:67, Min:50, Max:89    Pulse Range: Pulse  Avg: 111.1  Min: 82  Max: 130  Respiration Range: Resp  Avg: 23.3  Min: 16  Max: 60  Current Pulse Ox::  SpO2: 98 %  24HR Pulse Ox Range:  SpO2  Avg: 97.5 %  Min: 93 %  Max: 100 %  Oxygen Amount and Delivery: O2 Flow Rate (L/min): 4 L/min      I/O (24 Hours)    Patient Vitals for the past 8 hrs:   BP Temp Temp src Pulse Resp SpO2 Weight   02/20/16 0700 126/62 - - 105 20 98 % -  02/20/16 0600 (!) 103/50 - - 109 20 93 % 164 lb 3.9 oz (74.5 kg)   02/20/16 0500 (!) 103/50 - - 108 18 98 % -   02/20/16 0400 120/65 97.5 ??F (36.4 ??C) Axillary 112 20 96 % -   02/20/16 0300 (!) 100/51 - - 82 (!) 60 97 % -   02/20/16 0200 (!) 96/52 - - 90 27 97 % -   02/20/16 0100 (!) 113/55 - - 99 18 95 % -   02/20/16 0000 96/60 98.2 ??F (36.8 ??C) Oral 96 18 96 % -        Intake/Output Summary (Last 24 hours) at 02/20/16 0731  Last data filed at 02/20/16 0700   Gross per 24 hour   Intake             2982 ml   Output             1540 ml   Net             1442 ml     I/O last 3 completed shifts:  In: 2982 [I.V.:2882; IV Piggyback:100]  Out: 1540 [Drains:1440; Other:90; Blood:10]     Date 02/20/16 0000 - 02/20/16 2359   Shift 0000-0759 7253-6644 1600-2359 24 Hour Total   I  N  T  A  K  E   I.V.  (mL/kg) 882  (11.8)   882  (11.8)    IV Piggyback  (mL/kg) 100  (1.3)   100  (1.3)    Shift Total  (mL/kg) 982  (13.2)   982  (13.2)   O  U  T  P  U  T   Drains  (mL/kg) 740  (9.9)   740  (9.9)    Shift Total  (mL/kg) 740  (9.9)   740  (9.9)   Weight (kg) 74.5 74.5 74.5 74.5     Patient Vitals for the past 96 hrs (Last 3 readings):   Weight   02/20/16 0600 164 lb 3.9 oz (74.5 kg)   02/19/16 1205 181 lb (82.1 kg)         Drains/Tubes Outputs  L JP, foley    Exam   PHYSICAL EXAM:  CONSTITUTIONAL:  Awake, alert, confused, in no distress  EYES:  Lids and lashes normal, pupils equal, round and reactive to light, extra ocular muscles intact, sclera clear, conjunctiva normal  ENT:  Normocephalic, without obvious abnormality, atraumatic  NECK:  Supple, symmetrical, trachea midline, no adenopathy  LUNGS:  No increased work of breathing, good air exchange, clear to auscultation bilaterally, no crackles or wheezing  CARDIOVASCULAR:  + murmur regular rate and rhythm  ABDOMEN:  Soft, non-distended, surgical port incisions with dermabond, clean dry in tact  GENITAL/URINARY:  Foley with dark yellow urine  MUSCULOSKELETAL:  There is no redness, warmth, or swelling of the joints.   Tone is normal.  NEUROLOGIC:  Awake, alert, confused. No focal deficits.   SKIN:  normal skin color, texture, turgor    Data   Old records and images have been reviewed    Lab Results   CBC   Lab Results   Component Value Date    WBC 30.6 02/20/2016    RBC 3.65 02/20/2016    HGB 11.6 02/20/2016    HCT 35.1 02/20/2016    PLT 402  02/20/2016    MCV 96.2 02/20/2016    MCH 31.8 02/20/2016    MCHC 33.0 02/20/2016    RDW 15.3 02/20/2016  LYMPHOPCT 3.2 02/20/2016    MONOPCT 5.2 02/20/2016    BASOPCT 0.2 02/20/2016    MONOSABS 1.59 02/20/2016    LYMPHSABS 0.99 02/20/2016    EOSABS 0.00 02/20/2016    BASOSABS 0.06 02/20/2016       BMP Lab Results   Component Value Date    NA 145 02/20/2016    K 4.3 02/20/2016    CL 107 02/20/2016    CO2 24 02/20/2016    BUN 12 02/20/2016    CREATININE 0.8 02/20/2016    GLUCOSE 128 02/20/2016    CALCIUM 7.5 02/20/2016       LFTS  Lab Results   Component Value Date    ALKPHOS 105 02/20/2016    ALT 37 02/20/2016    AST 48 02/20/2016    PROT 4.9 02/20/2016    BILITOT 0.8 02/20/2016    LABALBU 2.2 02/20/2016       INR  Recent Labs      02/19/16   1300   PROTIME  17.5*   INR  1.6       APTT  Recent Labs      02/19/16   1300   APTT  34.9       Lactic Acid  Lab Results   Component Value Date    LACTA 4.5 02/20/2016    LACTA 6.6 02/19/2016    LACTA 8.7 02/19/2016        BNP   No results for input(s): BNP in the last 72 hours.     Cultures   No results for input(s): BC in the last 72 hours.  No results for input(s): BLOODCULT2 in the last 72 hours.    No results for input(s): LABURIN in the last 72 hours.          Radiology   CXR  Findings: There is a stable, enlarged cardiomediastinal silhouette   with curvilinear air underneath the left hemidiaphragm. Vascular   calcifications thoracic aorta.. No pneumothorax..       CT Scans  The bowel is ??abnormal. There is evidence for free air. There is   evidence for cirrhosis. There is evidence for C ascites. There is   pneumomediastinum extending along the distal stomach. There is   evidence for diverticulosis. There is a large hiatal hernia   The bladder is unremarkable.         SYSTEMS ASSESSMENT    Neuro   Confused, agitated - ?baseline. on aricept, wellbutrin, and celexa, resume once no longer NPO. Haldol x1    Respiratory   Continue home  Breathing treatments    Cardiovascular    hold pradaxa for now    Gastrointestinal   Perforated gastric ulcer s/p lap graham patch - PPI BID, NPO, zosyn, diflucan. Diet per surgery    Renal   No acute concerns at this time.      Infectious Disease   Zosyn/diflucan for peritonitis 2/2 perforation. Follow cultures    Hematology/Oncology   SCDs for DVT prophylaxis    Endocrine   Continue home synthroid.     Social/Spiritual/DNR/Other   Full code        Vivianne SpenceMerissa Ferguson, DO PGY2    02/20/2016, 7:31 AM     Attending Physician Attestation: Dr. Boyce MediciAnthony Tighe Gitto    Thank you very much for allowing me to see this patient in consultation and follow up.    I personally saw, examined and provided care for the patient. Radiographs, labs and medication list were reviewed by me  independently. I spoke with bedside nursing, respiratory therapists and consultants. Critical care services and times documented are independent of procedures and multidisciplinary rounds with Residents. Additionally comprehensive, multidisciplinary rounds were conducted with the MICU team. The case was discussed in detail and plans for care were established. Review of Residents documentation was conducted and revisions were made as appropriate. I agree with the the above documented information.     ASSESSMENT:  1.) Perforated Gastric Ulcer - s/p Lap Graham Patch  2.) Chronic Baseline Dementia  3.) Generalized Debility     In addition the following applies:    Check: n/a  Medication Alterations: continue zosyn and fluconazole, seoquel BID, haldol prn  Procedures: n/a  Imaging: reviewed  New Consultations: n/a  VENT: n/a    - OK to transfer patient out of ICU level of care at this time with no need for [facility] follow up    Thank you for allowing me to participate in the care of this patient.    Care reviewed with nursing staff, medical and surgical specialty care, primary care and the patient's family as available. Restraints are ordered when the patient can do harm to him/herself by pulling out  devices.    Critical care time spent reviewing labs/films, examining patient, collaborating with other physicians but excluding procedures for life threatening organ failure is:    Chart review/lab review/X-ray viewing/documentation: 10 minutes  Assessment: 10 minutes  Conversation patient/family re: prognosis, care options and any end of life issues: 10 minutes  Conversation with staff: 5 minutes    Critical Care Time: > 35 minutes excluding procedures    Boyce Medici, M.D.    Boyce Medici  02/20/2016  3:19 PM

## 2016-02-20 NOTE — Consults (Signed)
[facility]                 [address] [place], [place] [address]                                 CONSULTATION    PATIENT NAME: [name], [name]                      DOB:             May 01, 1927  MED REC NO:   [id number]                             ROOM:            0607  ACCOUNT NO:   [id number]                           ADMISSION DATE:  02/19/2016  PROVIDER:    Veryl Speak, DO    CONSULT DATE:  02/20/2016    CHIEF COMPLAINT:  Abdominal pain.    HISTORY OF PRESENT ILLNESS:  This is an 82 year old female that was  seen in my office the day prior to this admission with acute onset of  severe abdominal pain.  While in the office, she was noted to have  marked tenderness throughout her entire abdomen, however, appeared to  be localized in the right lower quadrant.  I explained to the  patient's daughter that could not justify not getting a CT scan and  therefore asked that she go to the emergency room for further  evaluation.  CT scan of the abdomen was performed in the emergency  room and it showed evidence of free air as well as ascites and changes  in the liver consistent with cirrhosis.  Surgery was consulted and she  was brought to the surgery and was found to have a perforated viscus  in the prepyloric area secondary to the ulcer.  Cheree Ditto patch was  applied.  The patient was sent to ICU for close monitoring.  The  patient did well overnight.  Pulmonology saw the patient during her  brief ICU stay, felt that she was stable and therefore she was  transferred to telemetry unit for close monitoring.  I was asked to  see the patient for medical management.  I saw her on postop day #1.   She appeared to be very lethargic and daughter felt that this was  secondary to the pain medicine that she was prescribed.  She stated  that her abdominal pain did improve, was otherwise voicing no other  complaints; however, as stated above, the patient did appear to be  lethargic and in  addition to this, her baseline mental status has  declined secondary to what most likely is worsening dementia.  She did  appear to be comfortable and stable, however.  Laboratory studies were  performed while in the emergency room initially, which showing her  white cell count to be significantly elevated at 31,200 with increased  neutrophil count.  CMP showed her transaminases to be high at 49 and  69, ALT and AST respectively.  Glucose slightly high at 123.  Her  lactic acid is significantly high at 6.6.  BUN and creatinine stable  at 12 and 0.6.  Remainder of  electrolytes are stable.    PAST MEDICAL HISTORY:  Consists of hypothyroidism, restless legs  syndrome, hyperlipidemia, gastroesophageal reflux disease, memory loss  with dementia, depression, COPD, asthma, atrial fibrillation.    PAST SURGICAL HISTORY:  Varicose vein surgery, bilateral knee  replacement, hysterectomy, breast lumpectomy, lumbar surgery,  diagnostic endoscopies, last in 2016, and recent Graham patch for  gastric ulcer.    MEDICATIONS:  See chart.    ALLERGIES:  ASPIRIN, EGG PRODUCTS and SULFA ANTIBIOTICS.    SOCIAL HISTORY:  No recreational drug, alcohol, or cigarette abuse.    FAMILY HISTORY:  No family history known.    PHYSICAL EXAMINATION:  VITAL SIGNS:  Temperature 98.1, pulse 109, respirations 20, blood  pressure 131/69, pulse ox 97% on room air.  GENERAL:  Well-developed, well-nourished, elderly female who appears  her stated age.  She is alert, however, appears to be somewhat  disoriented, does answer questions seemingly appropriately.  No acute  distress.  HEENT:  Atraumatic, normocephalic.  Pupils are equal, round, and  reactive to light.  Extraocular movements are intact.  Nares are  patent.  External auditory canals are nonerythematous.  Pharynx is  clear.  NECK:  Supple.  No bruits.  No cervical lymphadenopathy.  SKIN:  No excessive bruising, scarring, cyanosis, or erythema.  She  does have few ecchymotic areas in the  legs.  HEART:  Irregularly irregular rhythm.  No murmurs noted.  LUNGS:  Clear to auscultation bilaterally without wheezes or rales.  ABDOMEN:  Diffuse, moderate tenderness throughout the entire abdomen  with guarding.  Bowel sounds present.  EXTREMITIES:  Range of motion and strength intact.  No clubbing,  cyanosis, or edema, upper and lower extremities bilaterally.  NEUROLOGIC:  Cranial nerves II through XII are grossly intact.  No  focal neurologic deficits noted.  MUSCULOSKELETAL:  No excessive lordosis, kyphosis, or scoliosis.  No  gross bony or soft tissue asymmetry and no excessive paraspinal muscle  spasm.    ASSESSMENT:  1.  Perforated prepyloric ulcer.  2.  Peritonitis secondary to #1.  3.  Leukocytosis secondary to #1.  4.  Atrial fibrillation.    PLAN:  The patient is now on telemetry with routine vital signs.  She  is to be resting.  IV fluids with normal saline at 125, started per  surgery.  The patient will be taking Protonix 40 mg twice daily.   Haldol will be given for agitation.  The patient was started on Zosyn  per surgery.  Lovenox will be given for the time being with regards to  her atrial fibrillation.  Morphine ordered for pain.  We will follow  laboratory studies closely.  Diet will be advanced by surgery when the  patient stabilizes and I will follow the patient throughout her stay.    For any further orders, please see chart.        Veryl SpeakJOSEPH Henderson Frampton, DO    D: 02/20/2016 18:16:47       T: 02/20/2016 23:30:00     [id number]  Job#: [id number]     Doc#: [id number]

## 2016-02-20 NOTE — Progress Notes (Signed)
Attention Dr Duncan DullHeather Dunlap:  This patient is currently ordered Diflucan and has a QTc of 555 ms as of 02-19-16 at 12:33.    Diflucan - Known Risk of TdP - This drug prolongs the QT interval AND is clearly associated with a known risk of TdP, even when taken as recommended.    Pharmacy recommends discontinuing this medications until the QTc returns to the acceptable range.  Herminio Commonsonald Rosana Farnell, RPh  02/20/2016  10:37 PM

## 2016-02-20 NOTE — Care Coordination-Inpatient (Signed)
Social Work / Discharge Planning : SW noted consult for [place] at discharge. SW met with patient and granddaughter Morey Hummingbird. Family provided SW with information due to patient current status. Patient resides with her daughter Arbie Cookey in a one floor home with a basement.Grand daughter Morey Hummingbird is also assist with meeting patient needs at home as well. Prior to admit, patient was totally independent and does not use a device nor has DME. Family states patient recently moved from [place] and not sure if patient has had a HHC or SNF hx. Patient PCP is Dr Carney Corners and she uses [facility]. Family is receptive to [facility] but also receptive to SNF if needed. Patient will need PT/OT  ordered when appropriate to assist with discharge planning. SW to follow. Electronically signed by Jill Alexanders, LSW on 02/20/2016 at 10:32 AM

## 2016-02-20 NOTE — Plan of Care (Signed)
Problem: Restraint Use - Nonviolent/Non-Self-Destructive Behavior:  Goal: Absence of restraint indications  Absence of restraint indications   Outcome: Not Met This Shift    Goal: Absence of restraint-related injury  Absence of restraint-related injury   Outcome: Met This Shift      Problem: Falls - Risk of  Goal: Absence of falls  Outcome: Met This Shift

## 2016-02-21 LAB — CBC WITH AUTO DIFFERENTIAL
Basophils %: 0.1 % (ref 0.0–2.0)
Basophils Absolute: 0.03 E9/L (ref 0.00–0.20)
Eosinophils %: 0 % (ref 0.0–6.0)
Eosinophils Absolute: 0 E9/L — ABNORMAL LOW (ref 0.05–0.50)
Hematocrit: 35.4 % (ref 34.0–48.0)
Hemoglobin: 11.6 g/dL (ref 11.5–15.5)
Immature Granulocytes #: 0.18 E9/L
Immature Granulocytes %: 0.7 % (ref 0.0–5.0)
Lymphocytes %: 6.1 % — ABNORMAL LOW (ref 20.0–42.0)
Lymphocytes Absolute: 1.63 E9/L (ref 1.50–4.00)
MCH: 31.8 pg (ref 26.0–35.0)
MCHC: 32.8 % (ref 32.0–34.5)
MCV: 97 fL (ref 80.0–99.9)
MPV: 9.6 fL (ref 7.0–12.0)
Monocytes %: 10.2 % (ref 2.0–12.0)
Monocytes Absolute: 2.75 E9/L — ABNORMAL HIGH (ref 0.10–0.95)
Neutrophils %: 82.9 % — ABNORMAL HIGH (ref 43.0–80.0)
Neutrophils Absolute: 22.3 E9/L — ABNORMAL HIGH (ref 1.80–7.30)
Platelets: 451 E9/L — ABNORMAL HIGH (ref 130–450)
RBC: 3.65 E12/L (ref 3.50–5.50)
RDW: 15.2 fL — ABNORMAL HIGH (ref 11.5–15.0)
WBC: 26.9 E9/L — ABNORMAL HIGH (ref 4.5–11.5)

## 2016-02-21 LAB — COMPREHENSIVE METABOLIC PANEL
ALT: <5 U/L (ref 0–32)
AST: 36 U/L — ABNORMAL HIGH (ref 0–31)
Albumin: 2.2 g/dL — ABNORMAL LOW (ref 3.5–5.2)
Alkaline Phosphatase: 113 U/L — ABNORMAL HIGH (ref 35–104)
Anion Gap: 11 mmol/L (ref 7–16)
BUN: 20 mg/dL (ref 8–23)
CO2: 26 mmol/L (ref 22–29)
Calcium: 8.4 mg/dL — ABNORMAL LOW (ref 8.6–10.2)
Chloride: 105 mmol/L (ref 98–107)
Creatinine: 0.9 mg/dL (ref 0.5–1.0)
GFR African American: >60
GFR Non-African American: 59 mL/min/{1.73_m2} (ref >=60)
Glucose: 96 mg/dL (ref 74–109)
Potassium: 3.7 mmol/L (ref 3.5–5.0)
Sodium: 142 mmol/L (ref 132–146)
Total Bilirubin: 0.8 mg/dL (ref 0.0–1.2)
Total Protein: 5.6 g/dL — ABNORMAL LOW (ref 6.4–8.3)

## 2016-02-21 LAB — PHOSPHORUS: Phosphorus: 2.6 mg/dL (ref 2.5–4.5)

## 2016-02-21 LAB — LACTIC ACID
Lactic Acid: 3.2 mmol/L — ABNORMAL HIGH (ref 0.5–2.2)
Lactic Acid: 2.8 mmol/L — ABNORMAL HIGH (ref 0.5–2.2)
Lactic Acid: 1.9 mmol/L (ref 0.5–2.2)

## 2016-02-21 LAB — CULTURE, MRSA, SCREENING

## 2016-02-21 LAB — MAGNESIUM: Magnesium: 2.1 mg/dL (ref 1.6–2.6)

## 2016-02-21 MED FILL — PANTOPRAZOLE SODIUM 40 MG IV SOLR: 40 MG | INTRAVENOUS | Qty: 40

## 2016-02-21 MED FILL — MORPHINE SULFATE 2 MG/ML IJ SOLN: 2 mg/mL | INTRAMUSCULAR | Qty: 1

## 2016-02-21 MED FILL — NORMAL SALINE FLUSH 0.9 % IV SOLN: 0.9 % | INTRAVENOUS | Qty: 10

## 2016-02-21 MED FILL — ZOSYN 3-0.375 GM/50ML IV SOLN: INTRAVENOUS | Qty: 50

## 2016-02-21 MED FILL — SODIUM CHLORIDE 0.9 % IJ SOLN: 0.9 % | INTRAMUSCULAR | Qty: 10

## 2016-02-21 MED FILL — FLUCONAZOLE IN SODIUM CHLORIDE 200-0.9 MG/100ML-% IV SOLN: INTRAVENOUS | Qty: 100

## 2016-02-21 MED FILL — NORMAL SALINE FLUSH 0.9 % IV SOLN: 0.9 % | INTRAVENOUS | Qty: 20

## 2016-02-21 MED FILL — ENOXAPARIN SODIUM 40 MG/0.4ML SC SOLN: 40 MG/0.4ML | SUBCUTANEOUS | Qty: 0.4

## 2016-02-21 NOTE — Progress Notes (Signed)
[facility] Quality Flow/Interdisciplinary Rounds Progress Note        Quality Flow Rounds held on February 21, 2016    Disciplines Attending:  Bedside Nurse and Nursing Unit Leadership    [name] was admitted on 02/19/2016  3:55 PM    Anticipated Discharge Date:  Expected Discharge Date: 02/24/16    Disposition:    Braden Score:  Braden Scale Score: 21    Readmission Risk              Readmission Risk:        14.75       Age 80 or Greater:  1    Admitted from SNF or Requires Paid or Family Care:  0    Currently has CHF,COPD,ARF,CRI,or is on dialysis:  4    Takes more than 5 Prescription Medications:  4    Takes Digoxin,Insulin,Anticoagulants,Narcotics or ASA/Plavix:  2    Hospital Admit in Past 12 Months:  0    On Disability:  0    Patient Considers own Health:  3.75          Discussed patient goal for the day, patient clinical progression, and barriers to discharge.  The following Goal(s) of the Day/Commitment(s) have been identified:  Monitor lactic acid      Judge Stallaitlin Olen Eaves  February 21, 2016

## 2016-02-21 NOTE — Plan of Care (Signed)
Problem: Falls - Risk of  Goal: Absence of falls  Outcome: Ongoing  Pt will remain free from falls

## 2016-02-21 NOTE — Progress Notes (Signed)
POD #2  S: pt confused with sitter in room    O: avss    Lactic acid 2.8    Wbc 26 (less)    Abd: soft mildly distended, wounds intact, JP with serosanguinous output non-bilious    Impression: POD #2 s/p alp graham pathch for perforated gastric ulcer    Plan: cont IV abx, cont NPO, NGT would be ideal however pt keeps pulling, monitor leukocytosis and lactate, will do contrast study on pOD #5

## 2016-02-21 NOTE — Progress Notes (Signed)
Admit Date: 02/19/2016  Hospital day 2    Subjective:     Patient resting in chair in NAD. Feeling better today and appears to be more appropriate.               Objective:       I/O last 3 completed shifts:  In: 20 [I.V.:20]  Out: 1300 [Urine:350; Drains:950]      BP 132/78    Pulse 88    Temp 98.4 ??F (36.9 ??C) (Oral)    Resp 16    Ht 5\' 4"  (1.626 m)    Wt 164 lb 3.9 oz (74.5 kg)    SpO2 94%    BMI 28.19 kg/m??   General appearance: alert, appears stated age, cooperative, no distress and slowed mentation  Lungs: clear to auscultation bilaterally  Heart: irregularly irregular rhythm  Abdomen: diffuse tenderness with gaurding. BS present  Extremities: edema 1+ BLE  Skin: Skin color, texture, turgor normal. No rashes or lesions       Data ReviewCBC:       Recent Results (from the past 24 hour(s))   Lactic acid, plasma    Collection Time: 02/20/16  3:50 PM   Result Value Ref Range    Lactic Acid 2.2 0.5 - 2.2 mmol/L   Lactic acid, plasma    Collection Time: 02/20/16 10:00 PM   Result Value Ref Range    Lactic Acid 3.2 (H) 0.5 - 2.2 mmol/L   Lactic acid, plasma    Collection Time: 02/21/16  4:25 AM   Result Value Ref Range    Lactic Acid 2.8 (H) 0.5 - 2.2 mmol/L   Comprehensive metabolic panel    Collection Time: 02/21/16  4:25 AM   Result Value Ref Range    Sodium 142 132 - 146 mmol/L    Potassium 3.7 3.5 - 5.0 mmol/L    Chloride 105 98 - 107 mmol/L    CO2 26 22 - 29 mmol/L    Anion Gap 11 7 - 16 mmol/L    Glucose 96 74 - 109 mg/dL    BUN 20 8 - 23 mg/dL    CREATININE 0.9 0.5 - 1.0 mg/dL    GFR Non-African American 59 >=60 mL/min/1.73    GFR African American >60     Calcium 8.4 (L) 8.6 - 10.2 mg/dL    Total Protein 5.6 (L) 6.4 - 8.3 g/dL    Alb 2.2 (L) 3.5 - 5.2 g/dL    Total Bilirubin 0.8 0.0 - 1.2 mg/dL    Alkaline Phosphatase 113 (H) 35 - 104 U/L    ALT <5 0 - 32 U/L    AST 36 (H) 0 - 31 U/L   CBC auto differential    Collection Time: 02/21/16  4:25 AM   Result Value Ref Range    WBC 26.9 (H) 4.5 - 11.5 E9/L    RBC  3.65 3.50 - 5.50 E12/L    Hemoglobin 11.6 11.5 - 15.5 g/dL    Hematocrit 59.535.4 63.834.0 - 48.0 %    MCV 97.0 80.0 - 99.9 fL    MCH 31.8 26.0 - 35.0 pg    MCHC 32.8 32.0 - 34.5 %    RDW 15.2 (H) 11.5 - 15.0 fL    Platelets 451 (H) 130 - 450 E9/L    MPV 9.6 7.0 - 12.0 fL    Neutrophils % 82.9 (H) 43.0 - 80.0 %    Immature Granulocytes % 0.7 0.0 - 5.0 %  Lymphocytes % 6.1 (L) 20.0 - 42.0 %    Monocytes % 10.2 2.0 - 12.0 %    Eosinophils % 0.0 0.0 - 6.0 %    Basophils % 0.1 0.0 - 2.0 %    Neutrophils # 22.30 (H) 1.80 - 7.30 E9/L    Immature Granulocytes # 0.18 E9/L    Lymphocytes # 1.63 1.50 - 4.00 E9/L    Monocytes # 2.75 (H) 0.10 - 0.95 E9/L    Eosinophils # 0.00 (L) 0.05 - 0.50 E9/L    Basophils # 0.03 0.00 - 0.20 E9/L    Anisocytosis 1+     Poikilocytes 2+     Burr Cells 2+    Magnesium    Collection Time: 02/21/16  4:25 AM   Result Value Ref Range    Magnesium 2.1 1.6 - 2.6 mg/dL   Phosphorus    Collection Time: 02/21/16  4:25 AM   Result Value Ref Range    Phosphorus 2.6 2.5 - 4.5 mg/dL           Assessment:     Active Problems:    Perforated viscus    Peritonitis (HCC)    Leukocytosis    A-fib (HCC)      Plan:     Decrease ivf. Zosyn per surgery. Present pain control. Follow labs and vitals closely. Hold DOAC. lovenox for now. Advance diet when ok with surgery.

## 2016-02-21 NOTE — Plan of Care (Signed)
Problem: Restraint Use - Nonviolent/Non-Self-Destructive Behavior:  Goal: Absence of restraint indications  Absence of restraint indications   Outcome: Ongoing      Problem: Falls - Risk of  Goal: Absence of falls  Outcome: Ongoing

## 2016-02-22 LAB — CBC WITH AUTO DIFFERENTIAL
Basophils %: 0.1 % (ref 0.0–2.0)
Basophils Absolute: 0.02 E9/L (ref 0.00–0.20)
Eosinophils %: 0.1 % (ref 0.0–6.0)
Eosinophils Absolute: 0.01 E9/L — ABNORMAL LOW (ref 0.05–0.50)
Hematocrit: 34.7 % (ref 34.0–48.0)
Hemoglobin: 11.3 g/dL — ABNORMAL LOW (ref 11.5–15.5)
Immature Granulocytes #: 0.14 E9/L
Immature Granulocytes %: 0.8 % (ref 0.0–5.0)
Lymphocytes %: 7.9 % — ABNORMAL LOW (ref 20.0–42.0)
Lymphocytes Absolute: 1.36 E9/L — ABNORMAL LOW (ref 1.50–4.00)
MCH: 31.7 pg (ref 26.0–35.0)
MCHC: 32.6 % (ref 32.0–34.5)
MCV: 97.5 fL (ref 80.0–99.9)
MPV: 9.4 fL (ref 7.0–12.0)
Monocytes %: 8.5 % (ref 2.0–12.0)
Monocytes Absolute: 1.46 E9/L — ABNORMAL HIGH (ref 0.10–0.95)
Neutrophils %: 82.6 % — ABNORMAL HIGH (ref 43.0–80.0)
Neutrophils Absolute: 14.16 E9/L — ABNORMAL HIGH (ref 1.80–7.30)
Platelets: 361 E9/L (ref 130–450)
RBC: 3.56 E12/L (ref 3.50–5.50)
RDW: 14.9 fL (ref 11.5–15.0)
WBC: 17.2 E9/L — ABNORMAL HIGH (ref 4.5–11.5)

## 2016-02-22 LAB — COMPREHENSIVE METABOLIC PANEL
ALT: 35 U/L — ABNORMAL HIGH (ref 0–32)
AST: 50 U/L — ABNORMAL HIGH (ref 0–31)
Albumin: 2.1 g/dL — ABNORMAL LOW (ref 3.5–5.2)
Alkaline Phosphatase: 113 U/L — ABNORMAL HIGH (ref 35–104)
Anion Gap: 12 mmol/L (ref 7–16)
BUN: 26 mg/dL — ABNORMAL HIGH (ref 8–23)
CO2: 24 mmol/L (ref 22–29)
Calcium: 8.1 mg/dL — ABNORMAL LOW (ref 8.6–10.2)
Chloride: 108 mmol/L — ABNORMAL HIGH (ref 98–107)
Creatinine: 0.9 mg/dL (ref 0.5–1.0)
GFR African American: >60
GFR Non-African American: 59 mL/min/{1.73_m2} (ref >=60)
Glucose: 78 mg/dL (ref 74–109)
Potassium: 3.4 mmol/L — ABNORMAL LOW (ref 3.5–5.0)
Sodium: 144 mmol/L (ref 132–146)
Total Bilirubin: 0.8 mg/dL (ref 0.0–1.2)
Total Protein: 5.1 g/dL — ABNORMAL LOW (ref 6.4–8.3)

## 2016-02-22 LAB — LACTIC ACID
Lactic Acid: 2.2 mmol/L (ref 0.5–2.2)
Lactic Acid: 1.9 mmol/L (ref 0.5–2.2)
Lactic Acid: 1.6 mmol/L (ref 0.5–2.2)

## 2016-02-22 LAB — MAGNESIUM: Magnesium: 2.1 mg/dL (ref 1.6–2.6)

## 2016-02-22 LAB — PHOSPHORUS: Phosphorus: 2.4 mg/dL — ABNORMAL LOW (ref 2.5–4.5)

## 2016-02-22 MED ORDER — FUROSEMIDE 10 MG/ML IJ SOLN
10 MG/ML | Freq: Once | INTRAMUSCULAR | Status: AC
Start: 2016-02-22 — End: 2016-02-22
  Administered 2016-02-22: 20:00:00 20 mg via INTRAVENOUS

## 2016-02-22 MED ORDER — POTASSIUM CHLORIDE CRYS ER 20 MEQ PO TBCR
20 MEQ | Freq: Once | ORAL | Status: DC
Start: 2016-02-22 — End: 2016-02-22
  Administered 2016-02-22: 20:00:00 20 meq via ORAL

## 2016-02-22 MED ORDER — POTASSIUM CHLORIDE 10 MEQ/100ML IV SOLN
10 MEQ/0ML | INTRAVENOUS | Status: AC
Start: 2016-02-22 — End: 2016-02-22
  Administered 2016-02-22 (×2): 10 meq via INTRAVENOUS

## 2016-02-22 MED ORDER — PIPERACILLIN-TAZOBACTAM 3.375 G IN 50 ML (PREMIX) IVPB EXTENDED
Freq: Three times a day (TID) | INTRAVENOUS | Status: DC
Start: 2016-02-22 — End: 2016-02-26
  Administered 2016-02-22 – 2016-02-26 (×13): 3.375 g via INTRAVENOUS

## 2016-02-22 MED FILL — POTASSIUM CHLORIDE 10 MEQ/100ML IV SOLN: 10 MEQ/0ML | INTRAVENOUS | Qty: 100

## 2016-02-22 MED FILL — FUROSEMIDE 10 MG/ML IJ SOLN: 10 MG/ML | INTRAMUSCULAR | Qty: 2

## 2016-02-22 MED FILL — MAPAP 325 MG PO TABS: 325 MG | ORAL | Qty: 2

## 2016-02-22 MED FILL — ZOSYN 3-0.375 GM/50ML IV SOLN: INTRAVENOUS | Qty: 50

## 2016-02-22 MED FILL — NORMAL SALINE FLUSH 0.9 % IV SOLN: 0.9 % | INTRAVENOUS | Qty: 10

## 2016-02-22 MED FILL — FLUCONAZOLE IN SODIUM CHLORIDE 200-0.9 MG/100ML-% IV SOLN: INTRAVENOUS | Qty: 100

## 2016-02-22 MED FILL — PANTOPRAZOLE SODIUM 40 MG IV SOLR: 40 MG | INTRAVENOUS | Qty: 40

## 2016-02-22 NOTE — Progress Notes (Signed)
POD #3    S; pt resting comfortably, improving  O: avss    Wbc improved, lactate normal  Abd; soft, appropriately tender    Jp with serosanguinous output    Impression: POD 3 s/p lap graham patch for perforated gastric ulcer    Plan: UGI on POD #5, continue abx and drain for now, will need rehab on dischage

## 2016-02-22 NOTE — Progress Notes (Signed)
Admit Date: 02/19/2016  Hospital day 2    Subjective:     Patient resting in chair in NAD. Feeling better today and appears to be more appropriate. Says she feels better and pain is less.    Objective:       I/O last 3 completed shifts:  In: 2119 [I.V.:1869; IV Piggyback:250]  Out: 500 [Urine:200; Drains:300]      BP (!) 144/64    Pulse 78    Temp 97.7 ??F (36.5 ??C) (Oral)    Resp 16    Ht 5\' 4"  (1.626 m)    Wt 164 lb 4.8 oz (74.5 kg)    SpO2 97%    BMI 28.20 kg/m??   General appearance: alert, appears stated age, cooperative, no distress and slowed mentation  Lungs: clear to auscultation bilaterally  Heart: irregularly irregular rhythm  Abdomen: diffuse tenderness with gaurding. BS present  Extremities: edema 1+ BLE  Skin: Skin color, texture, turgor normal. No rashes or lesions       Data ReviewCBC:       Recent Results (from the past 24 hour(s))   Lactic acid, plasma    Collection Time: 02/21/16  2:30 PM   Result Value Ref Range    Lactic Acid 1.9 0.5 - 2.2 mmol/L   Lactic acid, plasma    Collection Time: 02/21/16  9:15 PM   Result Value Ref Range    Lactic Acid 2.2 0.5 - 2.2 mmol/L   Lactic acid, plasma    Collection Time: 02/22/16  3:00 AM   Result Value Ref Range    Lactic Acid 1.9 0.5 - 2.2 mmol/L   Comprehensive metabolic panel    Collection Time: 02/22/16  3:00 AM   Result Value Ref Range    Sodium 144 132 - 146 mmol/L    Potassium 3.4 (L) 3.5 - 5.0 mmol/L    Chloride 108 (H) 98 - 107 mmol/L    CO2 24 22 - 29 mmol/L    Anion Gap 12 7 - 16 mmol/L    Glucose 78 74 - 109 mg/dL    BUN 26 (H) 8 - 23 mg/dL    CREATININE 0.9 0.5 - 1.0 mg/dL    GFR Non-African American 59 >=60 mL/min/1.73    GFR African American >60     Calcium 8.1 (L) 8.6 - 10.2 mg/dL    Total Protein 5.1 (L) 6.4 - 8.3 g/dL    Alb 2.1 (L) 3.5 - 5.2 g/dL    Total Bilirubin 0.8 0.0 - 1.2 mg/dL    Alkaline Phosphatase 113 (H) 35 - 104 U/L    ALT 35 (H) 0 - 32 U/L    AST 50 (H) 0 - 31 U/L   CBC auto differential    Collection Time: 02/22/16  3:00 AM    Result Value Ref Range    WBC 17.2 (H) 4.5 - 11.5 E9/L    RBC 3.56 3.50 - 5.50 E12/L    Hemoglobin 11.3 (L) 11.5 - 15.5 g/dL    Hematocrit 09.834.7 11.934.0 - 48.0 %    MCV 97.5 80.0 - 99.9 fL    MCH 31.7 26.0 - 35.0 pg    MCHC 32.6 32.0 - 34.5 %    RDW 14.9 11.5 - 15.0 fL    Platelets 361 130 - 450 E9/L    MPV 9.4 7.0 - 12.0 fL    Neutrophils % 82.6 (H) 43.0 - 80.0 %    Immature Granulocytes % 0.8 0.0 - 5.0 %  Lymphocytes % 7.9 (L) 20.0 - 42.0 %    Monocytes % 8.5 2.0 - 12.0 %    Eosinophils % 0.1 0.0 - 6.0 %    Basophils % 0.1 0.0 - 2.0 %    Neutrophils # 14.16 (H) 1.80 - 7.30 E9/L    Immature Granulocytes # 0.14 E9/L    Lymphocytes # 1.36 (L) 1.50 - 4.00 E9/L    Monocytes # 1.46 (H) 0.10 - 0.95 E9/L    Eosinophils # 0.01 (L) 0.05 - 0.50 E9/L    Basophils # 0.02 0.00 - 0.20 E9/L   Magnesium    Collection Time: 02/22/16  3:00 AM   Result Value Ref Range    Magnesium 2.1 1.6 - 2.6 mg/dL   Phosphorus    Collection Time: 02/22/16  3:00 AM   Result Value Ref Range    Phosphorus 2.4 (L) 2.5 - 4.5 mg/dL   Lactic acid, plasma    Collection Time: 02/22/16  8:20 AM   Result Value Ref Range    Lactic Acid 1.6 0.5 - 2.2 mmol/L           Assessment:     Active Problems:    Perforated viscus    Peritonitis (HCC)    Leukocytosis    A-fib (HCC)    Hypokalemia    Plan:     Decrease ivf. Zosyn per surgery. Will replace K+. Present pain control. Follow labs and vitals closely. Hold DOAC. lovenox for now. Advance diet when ok with surgery.

## 2016-02-23 LAB — CBC WITH AUTO DIFFERENTIAL
Basophils %: 0.1 % (ref 0.0–2.0)
Basophils Absolute: 0.01 E9/L (ref 0.00–0.20)
Eosinophils %: 0 % (ref 0.0–6.0)
Eosinophils Absolute: 0 E9/L — ABNORMAL LOW (ref 0.05–0.50)
Hematocrit: 33.5 % — ABNORMAL LOW (ref 34.0–48.0)
Hemoglobin: 11 g/dL — ABNORMAL LOW (ref 11.5–15.5)
Immature Granulocytes #: 0.05 E9/L
Immature Granulocytes %: 0.5 % (ref 0.0–5.0)
Lymphocytes %: 13.8 % — ABNORMAL LOW (ref 20.0–42.0)
Lymphocytes Absolute: 1.31 E9/L — ABNORMAL LOW (ref 1.50–4.00)
MCH: 31.7 pg (ref 26.0–35.0)
MCHC: 32.8 % (ref 32.0–34.5)
MCV: 96.5 fL (ref 80.0–99.9)
MPV: 9.1 fL (ref 7.0–12.0)
Monocytes %: 10.5 % (ref 2.0–12.0)
Monocytes Absolute: 1 E9/L — ABNORMAL HIGH (ref 0.10–0.95)
Neutrophils %: 75.1 % (ref 43.0–80.0)
Neutrophils Absolute: 7.12 E9/L (ref 1.80–7.30)
Platelets: 334 E9/L (ref 130–450)
RBC: 3.47 E12/L — ABNORMAL LOW (ref 3.50–5.50)
RDW: 14.9 fL (ref 11.5–15.0)
WBC: 9.5 E9/L (ref 4.5–11.5)

## 2016-02-23 LAB — COMPREHENSIVE METABOLIC PANEL
ALT: 34 U/L — ABNORMAL HIGH (ref 0–32)
AST: 47 U/L — ABNORMAL HIGH (ref 0–31)
Albumin: 2.1 g/dL — ABNORMAL LOW (ref 3.5–5.2)
Alkaline Phosphatase: 118 U/L — ABNORMAL HIGH (ref 35–104)
Anion Gap: 13 mmol/L (ref 7–16)
BUN: 27 mg/dL — ABNORMAL HIGH (ref 8–23)
CO2: 24 mmol/L (ref 22–29)
Calcium: 8.1 mg/dL — ABNORMAL LOW (ref 8.6–10.2)
Chloride: 108 mmol/L — ABNORMAL HIGH (ref 98–107)
Creatinine: 0.8 mg/dL (ref 0.5–1.0)
GFR African American: >60
GFR Non-African American: >60 mL/min/{1.73_m2} (ref >=60)
Glucose: 89 mg/dL (ref 74–109)
Potassium: 3.9 mmol/L (ref 3.5–5.0)
Sodium: 145 mmol/L (ref 132–146)
Total Bilirubin: 0.8 mg/dL (ref 0.0–1.2)
Total Protein: 5.2 g/dL — ABNORMAL LOW (ref 6.4–8.3)

## 2016-02-23 LAB — MAGNESIUM: Magnesium: 2.1 mg/dL (ref 1.6–2.6)

## 2016-02-23 LAB — PHOSPHORUS: Phosphorus: 2.6 mg/dL (ref 2.5–4.5)

## 2016-02-23 LAB — LACTIC ACID
Lactic Acid: 1.9 mmol/L (ref 0.5–2.2)
Lactic Acid: 1.5 mmol/L (ref 0.5–2.2)

## 2016-02-23 MED ORDER — FUROSEMIDE 10 MG/ML IJ SOLN
10 MG/ML | Freq: Once | INTRAMUSCULAR | Status: AC
Start: 2016-02-23 — End: 2016-02-23
  Administered 2016-02-23: 22:00:00 20 mg via INTRAVENOUS

## 2016-02-23 MED FILL — SODIUM CHLORIDE 0.9 % IJ SOLN: 0.9 % | INTRAMUSCULAR | Qty: 10

## 2016-02-23 MED FILL — NORMAL SALINE FLUSH 0.9 % IV SOLN: 0.9 % | INTRAVENOUS | Qty: 30

## 2016-02-23 MED FILL — MAPAP 325 MG PO TABS: 325 MG | ORAL | Qty: 2

## 2016-02-23 MED FILL — ZOSYN 3-0.375 GM/50ML IV SOLN: INTRAVENOUS | Qty: 50

## 2016-02-23 MED FILL — NORMAL SALINE FLUSH 0.9 % IV SOLN: 0.9 % | INTRAVENOUS | Qty: 10

## 2016-02-23 MED FILL — FLUCONAZOLE IN SODIUM CHLORIDE 200-0.9 MG/100ML-% IV SOLN: INTRAVENOUS | Qty: 100

## 2016-02-23 MED FILL — PANTOPRAZOLE SODIUM 40 MG IV SOLR: 40 MG | INTRAVENOUS | Qty: 40

## 2016-02-23 MED FILL — FUROSEMIDE 10 MG/ML IJ SOLN: 10 MG/ML | INTRAMUSCULAR | Qty: 2

## 2016-02-23 NOTE — Progress Notes (Signed)
Admit Date: 02/19/2016  Hospital day 3    Subjective:     Patient resting in chair in NAD. Feeling better today and appears to be more appropriate.     Objective:       I/O last 3 completed shifts:  In: 1858 [I.V.:1608; IV Piggyback:250]  Out: 1180 [Drains:1180]      BP 139/68    Pulse 77    Temp 98.2 ??F (36.8 ??C) (Oral)    Resp 16    Ht 5\' 4"  (1.626 m)    Wt 163 lb 1.6 oz (74 kg)    SpO2 97%    BMI 28.00 kg/m??   General appearance: alert, appears stated age, cooperative, no distress and slowed mentation  Lungs: clear to auscultation bilaterally  Heart: irregularly irregular rhythm  Abdomen: diffuse tenderness with gaurding. BS present  Extremities: edema 1+ BLE  Skin: Skin color, texture, turgor normal. No rashes or lesions       Data ReviewCBC:       Recent Results (from the past 24 hour(s))   Lactic acid, plasma    Collection Time: 02/22/16  8:55 PM   Result Value Ref Range    Lactic Acid 1.9 0.5 - 2.2 mmol/L   Lactic acid, plasma    Collection Time: 02/23/16  3:10 AM   Result Value Ref Range    Lactic Acid 1.5 0.5 - 2.2 mmol/L   Comprehensive metabolic panel    Collection Time: 02/23/16  3:10 AM   Result Value Ref Range    Sodium 145 132 - 146 mmol/L    Potassium 3.9 3.5 - 5.0 mmol/L    Chloride 108 (H) 98 - 107 mmol/L    CO2 24 22 - 29 mmol/L    Anion Gap 13 7 - 16 mmol/L    Glucose 89 74 - 109 mg/dL    BUN 27 (H) 8 - 23 mg/dL    CREATININE 0.8 0.5 - 1.0 mg/dL    GFR Non-African American >60 >=60 mL/min/1.73    GFR African American >60     Calcium 8.1 (L) 8.6 - 10.2 mg/dL    Total Protein 5.2 (L) 6.4 - 8.3 g/dL    Alb 2.1 (L) 3.5 - 5.2 g/dL    Total Bilirubin 0.8 0.0 - 1.2 mg/dL    Alkaline Phosphatase 118 (H) 35 - 104 U/L    ALT 34 (H) 0 - 32 U/L    AST 47 (H) 0 - 31 U/L   CBC auto differential    Collection Time: 02/23/16  3:10 AM   Result Value Ref Range    WBC 9.5 4.5 - 11.5 E9/L    RBC 3.47 (L) 3.50 - 5.50 E12/L    Hemoglobin 11.0 (L) 11.5 - 15.5 g/dL    Hematocrit 16.133.5 (L) 34.0 - 48.0 %    MCV 96.5 80.0 -  99.9 fL    MCH 31.7 26.0 - 35.0 pg    MCHC 32.8 32.0 - 34.5 %    RDW 14.9 11.5 - 15.0 fL    Platelets 334 130 - 450 E9/L    MPV 9.1 7.0 - 12.0 fL    Neutrophils % 75.1 43.0 - 80.0 %    Immature Granulocytes % 0.5 0.0 - 5.0 %    Lymphocytes % 13.8 (L) 20.0 - 42.0 %    Monocytes % 10.5 2.0 - 12.0 %    Eosinophils % 0.0 0.0 - 6.0 %    Basophils % 0.1 0.0 - 2.0 %  Neutrophils # 7.12 1.80 - 7.30 E9/L    Immature Granulocytes # 0.05 E9/L    Lymphocytes # 1.31 (L) 1.50 - 4.00 E9/L    Monocytes # 1.00 (H) 0.10 - 0.95 E9/L    Eosinophils # 0.00 (L) 0.05 - 0.50 E9/L    Basophils # 0.01 0.00 - 0.20 E9/L   Magnesium    Collection Time: 02/23/16  3:10 AM   Result Value Ref Range    Magnesium 2.1 1.6 - 2.6 mg/dL   Phosphorus    Collection Time: 02/23/16  3:10 AM   Result Value Ref Range    Phosphorus 2.6 2.5 - 4.5 mg/dL           Assessment:     Active Problems:    Perforated viscus    Peritonitis (HCC)    Leukocytosis    A-fib (HCC)    Hypokalemia    Peripheral Edema    Plan:     Decrease ivf. Zosyn per surgery. Will give Lasix. Present pain control. Follow labs and vitals closely. Hold DOAC. lovenox for now. Advance diet when ok with surgery.

## 2016-02-23 NOTE — Progress Notes (Signed)
[facility] Quality Flow/Interdisciplinary Rounds Progress Note        Quality Flow Rounds held on February 23, 2016    Disciplines Attending:  Bedside Nurse, Social Worker, Case Manager and Nursing Unit Leadership    [name] was admitted on 02/19/2016  3:55 PM    Anticipated Discharge Date:  Expected Discharge Date: 02/24/16    Disposition:    Braden Score:  Braden Scale Score: 18    Readmission Risk              Readmission Risk:        14.75       Age 80 or Greater:  1    Admitted from SNF or Requires Paid or Family Care:  0    Currently has CHF,COPD,ARF,CRI,or is on dialysis:  4    Takes more than 5 Prescription Medications:  4    Takes Digoxin,Insulin,Anticoagulants,Narcotics or ASA/Plavix:  2    Hospital Admit in Past 12 Months:  0    On Disability:  0    Patient Considers own Health:  3.75          Discussed patient goal for the day, patient clinical progression, and barriers to discharge.  The following Goal(s) of the Day/Commitment(s) have been identified:  Wean fluids, oxygen as tolerated, check surgical plan.      Michelle Colanderamela Pearley [name]  February 23, 2016

## 2016-02-23 NOTE — Progress Notes (Signed)
Admit Date: 02/19/2016  Hospital day 4    Subjective:     Patient resting in chair in NAD. Feeling better today and appears to be more appropriate.     Objective:       I/O last 3 completed shifts:  In: 1858 [I.V.:1608; IV Piggyback:250]  Out: 1180 [Drains:1180]      BP 139/68    Pulse 77    Temp 98.2 ??F (36.8 ??C) (Oral)    Resp 16    Ht 5\' 4"  (1.626 m)    Wt 163 lb 1.6 oz (74 kg)    SpO2 97%    BMI 28.00 kg/m??   General appearance: alert, appears stated age, cooperative, no distress and slowed mentation  Lungs: clear to auscultation bilaterally  Heart: irregularly irregular rhythm  Abdomen: diffuse tenderness with gaurding. BS present  Extremities: edema 1+ BLE  Skin: Skin color, texture, turgor normal. No rashes or lesions       Data ReviewCBC:       Recent Results (from the past 24 hour(s))   Lactic acid, plasma    Collection Time: 02/22/16  8:55 PM   Result Value Ref Range    Lactic Acid 1.9 0.5 - 2.2 mmol/L   Lactic acid, plasma    Collection Time: 02/23/16  3:10 AM   Result Value Ref Range    Lactic Acid 1.5 0.5 - 2.2 mmol/L   Comprehensive metabolic panel    Collection Time: 02/23/16  3:10 AM   Result Value Ref Range    Sodium 145 132 - 146 mmol/L    Potassium 3.9 3.5 - 5.0 mmol/L    Chloride 108 (H) 98 - 107 mmol/L    CO2 24 22 - 29 mmol/L    Anion Gap 13 7 - 16 mmol/L    Glucose 89 74 - 109 mg/dL    BUN 27 (H) 8 - 23 mg/dL    CREATININE 0.8 0.5 - 1.0 mg/dL    GFR Non-African American >60 >=60 mL/min/1.73    GFR African American >60     Calcium 8.1 (L) 8.6 - 10.2 mg/dL    Total Protein 5.2 (L) 6.4 - 8.3 g/dL    Alb 2.1 (L) 3.5 - 5.2 g/dL    Total Bilirubin 0.8 0.0 - 1.2 mg/dL    Alkaline Phosphatase 118 (H) 35 - 104 U/L    ALT 34 (H) 0 - 32 U/L    AST 47 (H) 0 - 31 U/L   CBC auto differential    Collection Time: 02/23/16  3:10 AM   Result Value Ref Range    WBC 9.5 4.5 - 11.5 E9/L    RBC 3.47 (L) 3.50 - 5.50 E12/L    Hemoglobin 11.0 (L) 11.5 - 15.5 g/dL    Hematocrit 82.933.5 (L) 34.0 - 48.0 %    MCV 96.5 80.0 -  99.9 fL    MCH 31.7 26.0 - 35.0 pg    MCHC 32.8 32.0 - 34.5 %    RDW 14.9 11.5 - 15.0 fL    Platelets 334 130 - 450 E9/L    MPV 9.1 7.0 - 12.0 fL    Neutrophils % 75.1 43.0 - 80.0 %    Immature Granulocytes % 0.5 0.0 - 5.0 %    Lymphocytes % 13.8 (L) 20.0 - 42.0 %    Monocytes % 10.5 2.0 - 12.0 %    Eosinophils % 0.0 0.0 - 6.0 %    Basophils % 0.1 0.0 - 2.0 %  Neutrophils # 7.12 1.80 - 7.30 E9/L    Immature Granulocytes # 0.05 E9/L    Lymphocytes # 1.31 (L) 1.50 - 4.00 E9/L    Monocytes # 1.00 (H) 0.10 - 0.95 E9/L    Eosinophils # 0.00 (L) 0.05 - 0.50 E9/L    Basophils # 0.01 0.00 - 0.20 E9/L   Magnesium    Collection Time: 02/23/16  3:10 AM   Result Value Ref Range    Magnesium 2.1 1.6 - 2.6 mg/dL   Phosphorus    Collection Time: 02/23/16  3:10 AM   Result Value Ref Range    Phosphorus 2.6 2.5 - 4.5 mg/dL           Assessment:     Active Problems:    Perforated viscus    Peritonitis (HCC)    Leukocytosis    A-fib (HCC)    Hypokalemia    Peripheral Edema    Plan:     Decrease ivf. Zosyn per surgery. Will give Lasix. Present pain control. Follow labs and vitals closely. Hold DOAC. lovenox for now. Advance diet when ok with surgery.

## 2016-02-23 NOTE — Progress Notes (Addendum)
GENERAL SURGERY  DAILY PROGRESS NOTE  02/23/2016    Subjective:  No acute events  Pain controlled  NPO  Denied n/v    Objective:  BP (!) 144/66    Pulse 71    Temp 98.3 ??F (36.8 ??C) (Axillary)    Resp 16    Ht 5\' 4"  (1.626 m)    Wt 163 lb 1.6 oz (74 kg)    SpO2 97%    BMI 28.00 kg/m??     General: NAD, awake and alert.   Head: Normocephalic, atraumatic  Eyes: PERRLA, EOMI.   Lungs: No increased work of breathing.   Cardiovascular: RRR.   Abdomen: Soft, ND, NT. Incisions c/d/i. JP drain OP: 530. No rebound, guarding or rigidity.  Extremities: Atraumatic, full range of motion  Skin: Warm, dry and intact      Assessment/Plan:  82 y.o. female with perforated gastric ulcer s/p dx lap w/ graham patch 10/27    Pain and nausea control PRN  NPO  IVF's  UGI tomorrow 10/31  OOB/AAT  DVT ppx    Electronically signed by Augusto Gambleyan D Konik, MD on 02/23/2016 at 6:07 AM     Pt seen, plan for UGI tomorrow

## 2016-02-23 NOTE — Progress Notes (Signed)
Physical Therapy    Facility/Department: [facility]  Initial Assessment    NAME: [name]  DOB: May 19, 1927  MRN: [id number]    Date of Service: 02/23/2016    Patient Diagnosis(es): There were no encounter diagnoses.     has a past medical history of A-fib (HCC); Anticoagulated; Asthma; COPD (chronic obstructive pulmonary disease) (HCC); Depression; Forgetfulness; GERD (gastroesophageal reflux disease); HOH (hard of hearing); Hyperlipidemia; Pain; Restless leg syndrome; and Thyroid disease.   has a past surgical history that includes joint replacement (Bilateral, 2011?); Varicose vein surgery (Bilateral, 2012); back surgery (2010?); Breast surgery; Hysterectomy (1970's); Endoscopy, colon, diagnostic (02/28/2015); and Abdomen surgery (02/19/2016).      Evaluating Therapist: Marijean HeathKim Karey Suthers, PT     Recommendation for discharge is SAR     Room #: 607    DIAGNOSIS: perforated viscus s/p repair     PRECAUTIONS:  Falls     Social:  Pt lives with  Granddaughter  in a  1  floor plan  Prior to admission pt walked with no AD .   Information from pt and she is a poor historian      Initial Evaluation  Date:  10/ 30  Treatment      Short Term/ Long Term   Goals   Was pt agreeable to Eval/treatment?  yes      Does pt have pain? None reported      Bed Mobility  Rolling:  Max assist   Supine to sit:  Max assist  Sit to supine: NT   Scooting:  Max assist in sit    mod assist    Transfers Sit to stand:  Min assist   Stand to sit:  Min assist  Stand pivot:  Min assist    CGA    Ambulation     5  feet with  HHA  with  Min assist    25  feet with  ww  with  CGA    LE ROM  WFL      LE strength  4-/ 5   4/ 5    AM- PAC RAW score  15/ 24            Pt is alert and Oriented x  2     Balance: min assist, high fall risk     Endurance: decreased   Chair alarm:  Yes      ASSESSMENT  Pt displays functional ability as noted in the objective portion of this evaluation.             Examination of body systems Decreased   Functional mobility x    ROM    Strength x   Safety Awareness x   Cognition x   Endurance x   Sensation    Balance x   Vision/Visual Deficits    Coordination        Patient education  Pt educated on fall risk     Patient response to education:   Pt verbalized understanding Pt demonstrated skill Pt requires further education in this area   x  x     Rehab potential is Good for reaching above PT goals.    Pt???s/ family goals   1.none stated     Patient and or family understand(s) diagnosis, prognosis, and plan of care. - yes , questionable     PLAN  PT care will be provided in accordance with the objectives noted above.  Whenever appropriate, clear delegation orders  will be provided for nursing staff.  Exercises and functional mobility practice will be used as well as appropriate assistive devices or modalities to obtain goals. Patient and family education will also be administered as needed.    Frequency of treatments will be 2-3 x/week x  5days.    Time in: 0934  Time out: 0946    Marijean HeathKim Brynlynn Walko  License number:  [id number]

## 2016-02-23 NOTE — Plan of Care (Signed)
Problem: Falls - Risk of  Goal: Absence of falls  Outcome: Ongoing  Pt in bed with 3 siderails up and bed alarm on, reoriented to call light for assistance getting up to the bathroom. Electronically signed by Dahlia ClientSarah Camaryn Lumbert, RN on 02/23/2016 at 12:16 AM

## 2016-02-23 NOTE — Care Coordination-Inpatient (Signed)
Social work / Discharge Planning:       Social work met with patient and family at bedside to discuss discharge planning / transition of care.   Social work discussed the therapy recommendation for SAR and provided a choice list for family to review.  Social work will need to provide a follow up visit for [facility].  Electronically signed by Willy Eddy, LSW on 02/23/2016 at 1:13 PM

## 2016-02-23 NOTE — Plan of Care (Signed)
Problem: Restraint Use - Nonviolent/Non-Self-Destructive Behavior:  Goal: Absence of restraint indications  Absence of restraint indications   Outcome: Ongoing      Problem: Falls - Risk of  Goal: Absence of falls  Outcome: Met This Shift      Problem: Pain:  Goal: Pain level will decrease  Pain level will decrease   Outcome: Met This Shift

## 2016-02-23 NOTE — Other (Addendum)
Patient Acct Nbr:  [id number]  Primary AUTH/CERT:  [id number]  Primary Insurance Company Name:   [employer]  Primary Insurance Plan Name:  [employer] MEDICARE GOLD PLUS HMO  Primary Insurance Group Number:  [id number]  Primary Insurance Plan Type: C  Primary Insurance Policy Number:  [id number]

## 2016-02-23 NOTE — Plan of Care (Signed)
Problem: Falls - Risk of  Goal: Absence of falls  Outcome: Met This Shift  TSO in place. Bed/chair alarm on. Patient educated on fall and safety precautions. Call light within reach. Hourly rounding continues

## 2016-02-23 NOTE — Progress Notes (Signed)
Occupational Therapy   Occupational Therapy Initial Assessment  Date: 02/23/2016   Patient Name: [name]  MRN: [id number]     DOB: 29-Oct-1927    Patient Diagnosis(es): There were no encounter diagnoses.     has a past medical history of A-fib (HCC); Anticoagulated; Asthma; COPD (chronic obstructive pulmonary disease) (HCC); Depression; Forgetfulness; GERD (gastroesophageal reflux disease); HOH (hard of hearing); Hyperlipidemia; Pain; Restless leg syndrome; and Thyroid disease.   has a past surgical history that includes joint replacement (Bilateral, 2011?); Varicose vein surgery (Bilateral, 2012); back surgery (2010?); Breast surgery; Hysterectomy (1970's); Endoscopy, colon, diagnostic (02/28/2015); and Abdomen surgery (02/19/2016).           Restrictions  Restrictions/Precautions  Restrictions/Precautions: Fall Risk (splinting)    Subjective   General  Chart Reviewed: Yes  Family / Caregiver Present: No  Diagnosis: patient admitted secondary to abd. pain.  patient underwent lap graham patch for perforated gastic ulcer  Pain Assessment  Patient Currently in Pain: Denies     Social/Functional History  Social/Functional History  Lives With: Daughter  Type of Home: House  Home Layout: One level  Home Access: Stairs to enter without rails (one step entry)  Bathroom Shower/Tub: Tub/Shower unit (sponge bath per patient)  FirefighterBathroom Toilet: Standard  Home Equipment: Ivar Drapeane  Receives Help From: Family (stated her sister assists at home)  ADL Assistance: Independent  Homemaking Assistance: Independent  Homemaking Responsibilities: Yes  Meal Prep Responsibility: Primary  Laundry Responsibility: Primary  Cleaning Responsibility: Primary       Objective   Vision: Within Functional Limits (has glasses)  Hearing: Within functional limits    Orientation  Overall Orientation Status: Within Functional Limits     Balance  Sitting Balance: Minimal assistance (seated edge of bed)  Standing Balance  Sit to stand: Minimal  assistance  ADL  Feeding: Supervision  Grooming: Supervision  UE Bathing: Supervision  LE Bathing: Moderate assistance  UE Dressing: Supervision  LE Dressing: Moderate assistance  Toileting: Moderate assistance        Bed mobility  Rolling to Left: Maximum assistance  Supine to Sit: Maximum assistance  Transfers  Stand Step Transfers: Minimal assistance  Sit to stand: Minimal assistance  Transfer Comments: with wheeled walker        Perception  Overall Perceptual Status: Impaired     Sensation  Overall Sensation Status: Impaired        LUE PROM (degrees)  LUE PROM: WFL  LUE AROM (degrees)  LUE AROM : WFL  Left Hand PROM (degrees)  Left Hand PROM: WFL  Left Hand AROM (degrees)  Left Hand AROM: WFL  RUE PROM (degrees)  RUE PROM: WFL  RUE AROM (degrees)  RUE AROM : WFL  Right Hand PROM (degrees)  Right Hand PROM: WFL  Right Hand AROM (degrees)  Right Hand AROM: WFL  LUE Strength  Gross LUE Strength: WFL  L Hand Grasp: 4-/5  L Hand Release: 4-/5  RUE Strength  Gross RUE Strength: WFL  R Hand Grasp: 4-/5     AM-PAC Daily Activity Inpatient   How much help for putting on and taking off regular lower body clothing?: A Lot  How much help for Bathing?: A Lot  How much help for Toileting?: A Little  How much help for putting on and taking off regular upper body clothing?: A Little  How much help for taking care of personal grooming?: A Little  How much help for eating meals?: A Little  AM-PAC Inpatient Daily Activity  Raw Score: 16  AM-PAC Inpatient ADL T-Scale Score : 35.96  ADL Inpatient CMS 0-100% Score: 53.32  ADL Inpatient CMS G-Code Modifier : CK    Assessment   Performance deficits / Impairments: Decreased functional mobility ;Decreased ADL status;Decreased strength;Decreased endurance;Decreased balance  Prognosis: Fair  Decision Making: Low Complexity  Patient Education: patient educated on occupational therapy and treatment plan.  patient able to verbalize understanding  Discharge Recommendations: Subacute/Skilled  Nursing Facility  REQUIRES OT FOLLOW UP: Yes  Activity Tolerance  Activity Tolerance: Patient Tolerated treatment well  Safety Devices  Safety Devices in place: Yes  Type of devices: Call light within reach;Left in chair;Chair alarm in place  OT Equipment Recommendations  Other: to be determined        Discharge Recommendations:  Subacute/Skilled Nursing Facility     Plan   Plan  Times per week: 5x/wk  prn  Times per day: Daily  Current Treatment Recommendations: Strengthening, Location managerBalance Training, Building services engineerunctional Mobility Training, Teaching laboratory technicianndurance Training, Patent attorneyafety Education & Training, Equities traderatient/Caregiver Education & Training, Mining engineerquipment Evaluation, Education, Sports administrator& procurement, Self-Care / ADL, Home Management Training    G-Code  OT G-codes  Functional Limitation: Self care  Self Care Current Status [phone](G8987): At least 60 percent but less than 80 percent impaired, limited or restricted  Self Care Goal Status (G9562(G8988): At least 1 percent but less than 20 percent impaired, limited or restricted    Goals  Patient Goals   Patient goals : to return home   1. Patient will perform functional transfers at mod ind level with dme as needed with good safety within two week    2. Patient will perform grooming at mod ind level within one week    3. Patient will perform ue bathing/dressing at mod ind within precautions within one week    4. Patient will perform le bathing/dressing at mod ind with adaptive equipment as needed within two weeks    5. Patient will perform functional mobility and activity with good safety within two weeks.    6. Patient will increase bilateral ue strength within available movement range by 1/3 grade of mmg within two weeks.      Educational Needs:    Patient will benefit from further education on general safety precautions during daily life tasks, adl re education, functional transfer training, adl adaptive equipment and dme education, and patient family education.        Baxter FlatteryWilliam E. Athol Bolds Jr., OTR/L  [phone]

## 2016-02-23 NOTE — Plan of Care (Signed)
Problem: Pain:  Goal: Control of acute pain  Control of acute pain   Outcome: Met This Shift

## 2016-02-23 NOTE — Progress Notes (Signed)
Attention Dr Luan Pullingunlap:  This patient is currently ordered Zofran and has a QTc of 555 ms as of 02-19-16 at 1233. Since Zofran can cause QTc prolongation, Pharmacy recommends switching the patient from Zofran to Tigan until the QTc returns to the acceptable range.    Teodoro SprayKatie Briana Farner, PharmD 02/23/2016 5:11 PM

## 2016-02-24 ENCOUNTER — Inpatient Hospital Stay: Admit: 2016-02-24 | Primary: Family Medicine

## 2016-02-24 LAB — CBC WITH AUTO DIFFERENTIAL
Basophils %: 0.1 % (ref 0.0–2.0)
Basophils Absolute: 0.01 E9/L (ref 0.00–0.20)
Eosinophils %: 0.1 % (ref 0.0–6.0)
Eosinophils Absolute: 0.01 E9/L — ABNORMAL LOW (ref 0.05–0.50)
Hematocrit: 33.7 % — ABNORMAL LOW (ref 34.0–48.0)
Hemoglobin: 10.7 g/dL — ABNORMAL LOW (ref 11.5–15.5)
Immature Granulocytes #: 0.05 E9/L
Immature Granulocytes %: 0.7 % (ref 0.0–5.0)
Lymphocytes %: 17.8 % — ABNORMAL LOW (ref 20.0–42.0)
Lymphocytes Absolute: 1.31 E9/L — ABNORMAL LOW (ref 1.50–4.00)
MCH: 30.8 pg (ref 26.0–35.0)
MCHC: 31.8 % — ABNORMAL LOW (ref 32.0–34.5)
MCV: 97.1 fL (ref 80.0–99.9)
MPV: 9.4 fL (ref 7.0–12.0)
Monocytes %: 12.8 % — ABNORMAL HIGH (ref 2.0–12.0)
Monocytes Absolute: 0.94 E9/L (ref 0.10–0.95)
Neutrophils %: 68.5 % (ref 43.0–80.0)
Neutrophils Absolute: 5.05 E9/L (ref 1.80–7.30)
Platelets: 306 E9/L (ref 130–450)
RBC: 3.47 E12/L — ABNORMAL LOW (ref 3.50–5.50)
RDW: 14.6 fL (ref 11.5–15.0)
WBC: 7.4 E9/L (ref 4.5–11.5)

## 2016-02-24 LAB — COMPREHENSIVE METABOLIC PANEL
ALT: 28 U/L (ref 0–32)
AST: 39 U/L — ABNORMAL HIGH (ref 0–31)
Albumin: 2.4 g/dL — ABNORMAL LOW (ref 3.5–5.2)
Alkaline Phosphatase: 157 U/L — ABNORMAL HIGH (ref 35–104)
Anion Gap: 12 mmol/L (ref 7–16)
BUN: 27 mg/dL — ABNORMAL HIGH (ref 8–23)
CO2: 27 mmol/L (ref 22–29)
Calcium: 8.1 mg/dL — ABNORMAL LOW (ref 8.6–10.2)
Chloride: 110 mmol/L — ABNORMAL HIGH (ref 98–107)
Creatinine: 0.8 mg/dL (ref 0.5–1.0)
GFR African American: >60
GFR Non-African American: >60 mL/min/{1.73_m2} (ref >=60)
Glucose: 90 mg/dL (ref 74–109)
Potassium: 3.7 mmol/L (ref 3.5–5.0)
Sodium: 149 mmol/L — ABNORMAL HIGH (ref 132–146)
Total Bilirubin: 0.7 mg/dL (ref 0.0–1.2)
Total Protein: 5.1 g/dL — ABNORMAL LOW (ref 6.4–8.3)

## 2016-02-24 LAB — CULTURE BLOOD #1: Blood Culture, Routine: 5

## 2016-02-24 LAB — CULTURE, BLOOD 2: Culture, Blood 2: 5

## 2016-02-24 MED ORDER — SODIUM CHLORIDE 0.45 % IV SOLN
0.45 % | INTRAVENOUS | Status: DC
Start: 2016-02-24 — End: 2016-02-25
  Administered 2016-02-24: 13:00:00 via INTRAVENOUS

## 2016-02-24 MED ORDER — ALBUMIN HUMAN 5 % IV SOLN
5 % | Freq: Once | INTRAVENOUS | Status: AC
Start: 2016-02-24 — End: 2016-02-24
  Administered 2016-02-24: 14:00:00 25 g via INTRAVENOUS

## 2016-02-24 MED ORDER — FUROSEMIDE 10 MG/ML IJ SOLN
10 MG/ML | Freq: Every day | INTRAMUSCULAR | Status: DC
Start: 2016-02-24 — End: 2016-02-25
  Administered 2016-02-24 – 2016-02-25 (×2): 20 mg via INTRAVENOUS

## 2016-02-24 MED FILL — FLUCONAZOLE IN SODIUM CHLORIDE 200-0.9 MG/100ML-% IV SOLN: INTRAVENOUS | Qty: 100

## 2016-02-24 MED FILL — PANTOPRAZOLE SODIUM 40 MG IV SOLR: 40 MG | INTRAVENOUS | Qty: 40

## 2016-02-24 MED FILL — NORMAL SALINE FLUSH 0.9 % IV SOLN: 0.9 % | INTRAVENOUS | Qty: 10

## 2016-02-24 MED FILL — ALBURX 5 % IV SOLN: 5 % | INTRAVENOUS | Qty: 500

## 2016-02-24 MED FILL — FUROSEMIDE 10 MG/ML IJ SOLN: 10 MG/ML | INTRAMUSCULAR | Qty: 2

## 2016-02-24 MED FILL — ZOSYN 3-0.375 GM/50ML IV SOLN: INTRAVENOUS | Qty: 50

## 2016-02-24 MED FILL — ENOXAPARIN SODIUM 40 MG/0.4ML SC SOLN: 40 MG/0.4ML | SUBCUTANEOUS | Qty: 0.4

## 2016-02-24 NOTE — Progress Notes (Signed)
Physical Therapy    Facility/Department: [facility]  Treatment note    NAME: Gay FillerRuth E Deharo  DOB: 05-26-1927  MRN: [id number]    Date of Service: 02/24/2016    Patient Diagnosis(es): There were no encounter diagnoses.     has a past medical history of A-fib (HCC); Anticoagulated; Asthma; COPD (chronic obstructive pulmonary disease) (HCC); Depression; Forgetfulness; GERD (gastroesophageal reflux disease); HOH (hard of hearing); Hyperlipidemia; Pain; Restless leg syndrome; and Thyroid disease.   has a past surgical history that includes joint replacement (Bilateral, 2011?); Varicose vein surgery (Bilateral, 2012); back surgery (2010?); Breast surgery; Hysterectomy (1970's); Endoscopy, colon, diagnostic (02/28/2015); and Abdomen surgery (02/19/2016).      Evaluating Therapist: Marijean HeathKim Krompegel, PT     Recommendation for discharge is SAR     Room #: 607    DIAGNOSIS: perforated viscus s/p repair     PRECAUTIONS:  Falls     Social:  Pt lives with  Granddaughter  in a  1  floor plan  Prior to admission pt walked with no AD .   Information from pt and she is a poor historian      Initial Evaluation  Date:  10/ 30  Treatment  10/31    Short Term/ Long Term   Goals   Was pt agreeable to Eval/treatment?  yes  yes    Does pt have pain? None reported  No complaints    Bed Mobility  Rolling:  Max assist   Supine to sit:  Max assist  Sit to supine: NT   Scooting:  Max assist in sit  Rolling: Min A  Supine to sit: Min A  Sit to supine: NT  Scooting: Min A to EOB  mod assist    Transfers Sit to stand:  Min assist   Stand to sit:  Min assist  Stand pivot:  Min assist  Sit to stand: Min A  Stand to sit: Min A  Stand Pivot; NT  CGA    Ambulation     5  feet with  HHA  with  Min assist  15 feet and 30 feet x 1 using [employer] for support Min/Mod A for balance  25  feet with  ww  with  CGA    LE ROM  WFL      LE strength  4-/ 5   4/ 5    AM- PAC RAW score  15/ 24  15/24      Balance: poor dynamic using WW for support    Patient education  Pt  was educated on transfer training promoting UE usage to assist, gait quality promoting upright posture and staying within [facility] LLCWW base of support    Patient response to education:   Pt verbalized understanding Pt demonstrated skill Pt requires further education in this area   x  x     Additional Comments: Nurse ok with Rx, Pt required encouragement to participate. Pt assisted to EOB, states needs to go to the bathroom, assist given for this. Pt stood with Min A for balance using [employer]WW for support while receiving hygiene care. Gait then performed in the room. Cadence slow and laboring, Pt fatigued after activity, required increased assist for balance during gait progression.     Pt was left upright in a recliner chair with call light in reach    Chair/bed alarm: chair alarm active    Treatment time: 23 minutes  Time out: 1105    Pt is making good progress  toward established Physical Therapy goals.  Continue with physical therapy current plan of care.    Noland Fordyce PTA   License Number: [id number]

## 2016-02-24 NOTE — Progress Notes (Signed)
Occupational Therapy  OCCUPATIONAL THERAPY DAILY NOTE    Date:02/24/2016  Patient Name: Michelle FillerRuth E Normoyle  MRN: [id number]  DOB: 07-Sep-1927  Room: 0607/0607-A     Patient Active Problem List   Diagnosis   ??? Perforated viscus   ??? Peritonitis (HCC)   ??? Leukocytosis   ??? A-fib (HCC)       Subjective:  Pt in bed, agreeable to therapy and cleared with nursing  Precautions: fall risk, splinting, drain  Chart Reviewed:  Yes          Independent Supervision Contact Guard Assist Minimal Assist Moderate Assist Maximum Assist Dependent   Feeding          Grooming          UE  Bathing          LE Bathing          UE Dressing          LE  Dressing                    Toileting               x    Comments:  Pt max A to complete toileting in bathroom.     Functional Transfers:  Supine to sit min A. Sit to stand min A. Functional mobility in room using WW min to mod A with fatigue. Stand to sit min A. Toilet transfer min A with v/c for hand placement/technique.     Therapeutic Exercises:  Fair use of UEs for support during functional transfers    Other:  Fair tolerance of activity d/t decreased endurance/strength, fatigues quickly with mobility. Decreased safety awareness demonstrated. Pt in chair with call light in reach and alarm on at the end of the session    Education:  Dan HumphreysWalker and transfer safety/technique, safety awareness, adl reeducation, orientation    Equipment Recommendations:  Continue to assess  Placement Recommendations:  snf/sar    AM-PAC Inpatient Daily Activity Raw Score:  15    Pain Level:  /10   Additional Notes:  No complaints  Patient has made good progress during treatment sessions toward set goals.   [x]  Continue with current OT Plan of care.  []  Prepare for Discharge    Oletha CruelMarisa A Angelyse Heslin COTA/L [phone]    Total Tx Time: 6323

## 2016-02-24 NOTE — Care Coordination-Inpatient (Signed)
Social work / Dentist:       Social work met with patient's family for [employer].   First choice is [facility] but they are out of network.   Second choice is [facility].  Referral called to the liaison.  Awaiting response.  Electronically signed by Willy Eddy, LSW on 02/24/2016 at 12:48 PM

## 2016-02-24 NOTE — Progress Notes (Addendum)
Admit Date: 02/19/2016  Hospital day 5    Subjective:     Patient resting in chair in NAD. Feels well today. Appropriate.    Objective:       I/O last 3 completed shifts:  In: -   Out: 2180 [Drains:2180]      BP 131/60    Pulse 71    Temp 98.4 ??F (36.9 ??C) (Oral)    Resp 16    Ht 5\' 4"  (1.626 m)    Wt 163 lb 5.8 oz (74.1 kg)    SpO2 97%    BMI 28.04 kg/m??   General appearance: alert, appears stated age, cooperative and no distress  Lungs: clear to auscultation bilaterally  Heart: irregularly irregular rhythm  Abdomen: diffuse tenderness with gaurding. BS present  Extremities: edema 1+ BLE  Skin: Skin color, texture, turgor normal. No rashes or lesions       Data ReviewCBC:       Recent Results (from the past 24 hour(s))   Comprehensive metabolic panel    Collection Time: 02/24/16  2:30 AM   Result Value Ref Range    Sodium 149 (H) 132 - 146 mmol/L    Potassium 3.7 3.5 - 5.0 mmol/L    Chloride 110 (H) 98 - 107 mmol/L    CO2 27 22 - 29 mmol/L    Anion Gap 12 7 - 16 mmol/L    Glucose 90 74 - 109 mg/dL    BUN 27 (H) 8 - 23 mg/dL    CREATININE 0.8 0.5 - 1.0 mg/dL    GFR Non-African American >60 >=60 mL/min/1.73    GFR African American >60     Calcium 8.1 (L) 8.6 - 10.2 mg/dL    Total Protein 5.1 (L) 6.4 - 8.3 g/dL    Alb 2.4 (L) 3.5 - 5.2 g/dL    Total Bilirubin 0.7 0.0 - 1.2 mg/dL    Alkaline Phosphatase 157 (H) 35 - 104 U/L    ALT 28 0 - 32 U/L    AST 39 (H) 0 - 31 U/L   CBC auto differential    Collection Time: 02/24/16  2:30 AM   Result Value Ref Range    WBC 7.4 4.5 - 11.5 E9/L    RBC 3.47 (L) 3.50 - 5.50 E12/L    Hemoglobin 10.7 (L) 11.5 - 15.5 g/dL    Hematocrit 16.1 (L) 34.0 - 48.0 %    MCV 97.1 80.0 - 99.9 fL    MCH 30.8 26.0 - 35.0 pg    MCHC 31.8 (L) 32.0 - 34.5 %    RDW 14.6 11.5 - 15.0 fL    Platelets 306 130 - 450 E9/L    MPV 9.4 7.0 - 12.0 fL    Neutrophils % 68.5 43.0 - 80.0 %    Immature Granulocytes % 0.7 0.0 - 5.0 %    Lymphocytes % 17.8 (L) 20.0 - 42.0 %    Monocytes % 12.8 (H) 2.0 - 12.0 %     Eosinophils % 0.1 0.0 - 6.0 %    Basophils % 0.1 0.0 - 2.0 %    Neutrophils # 5.05 1.80 - 7.30 E9/L    Immature Granulocytes # 0.05 E9/L    Lymphocytes # 1.31 (L) 1.50 - 4.00 E9/L    Monocytes # 0.94 0.10 - 0.95 E9/L    Eosinophils # 0.01 (L) 0.05 - 0.50 E9/L    Basophils # 0.01 0.00 - 0.20 E9/L  Assessment:     Active Problems:    Perforated viscus    Peritonitis (HCC)    Leukocytosis    A-fib (HCC)    Hypokalemia    Peripheral Edema    Plan:     Decrease ivf. Zosyn per surgery. Will give Lasix. Present pain control. Follow labs and vitals closely. Hold DOAC. lovenox for now. Advance diet when ok with surgery.  Admit Date: 02/19/2016  Hospital day 4    Subjective:     Patient resting in chair in NAD. Feeling better today and appears to be more appropriate.     Objective:       I/O last 3 completed shifts:  In: -   Out: 2180 [Drains:2180]      BP 131/60    Pulse 71    Temp 98.4 ??F (36.9 ??C) (Oral)    Resp 16    Ht 5\' 4"  (1.626 m)    Wt 163 lb 5.8 oz (74.1 kg)    SpO2 97%    BMI 28.04 kg/m??   General appearance: alert, appears stated age, cooperative, no distress and slowed mentation  Lungs: clear to auscultation bilaterally  Heart: irregularly irregular rhythm  Abdomen: diffuse tenderness with gaurding. BS present  Extremities: edema 1+ BLE  Skin: Skin color, texture, turgor normal. No rashes or lesions       Data ReviewCBC:       Recent Results (from the past 24 hour(s))   Comprehensive metabolic panel    Collection Time: 02/24/16  2:30 AM   Result Value Ref Range    Sodium 149 (H) 132 - 146 mmol/L    Potassium 3.7 3.5 - 5.0 mmol/L    Chloride 110 (H) 98 - 107 mmol/L    CO2 27 22 - 29 mmol/L    Anion Gap 12 7 - 16 mmol/L    Glucose 90 74 - 109 mg/dL    BUN 27 (H) 8 - 23 mg/dL    CREATININE 0.8 0.5 - 1.0 mg/dL    GFR Non-African American >60 >=60 mL/min/1.73    GFR African American >60     Calcium 8.1 (L) 8.6 - 10.2 mg/dL    Total Protein 5.1 (L) 6.4 - 8.3 g/dL    Alb 2.4 (L) 3.5 - 5.2 g/dL    Total  Bilirubin 0.7 0.0 - 1.2 mg/dL    Alkaline Phosphatase 157 (H) 35 - 104 U/L    ALT 28 0 - 32 U/L    AST 39 (H) 0 - 31 U/L   CBC auto differential    Collection Time: 02/24/16  2:30 AM   Result Value Ref Range    WBC 7.4 4.5 - 11.5 E9/L    RBC 3.47 (L) 3.50 - 5.50 E12/L    Hemoglobin 10.7 (L) 11.5 - 15.5 g/dL    Hematocrit 16.133.7 (L) 34.0 - 48.0 %    MCV 97.1 80.0 - 99.9 fL    MCH 30.8 26.0 - 35.0 pg    MCHC 31.8 (L) 32.0 - 34.5 %    RDW 14.6 11.5 - 15.0 fL    Platelets 306 130 - 450 E9/L    MPV 9.4 7.0 - 12.0 fL    Neutrophils % 68.5 43.0 - 80.0 %    Immature Granulocytes % 0.7 0.0 - 5.0 %    Lymphocytes % 17.8 (L) 20.0 - 42.0 %    Monocytes % 12.8 (H) 2.0 - 12.0 %    Eosinophils % 0.1 0.0 -  6.0 %    Basophils % 0.1 0.0 - 2.0 %    Neutrophils # 5.05 1.80 - 7.30 E9/L    Immature Granulocytes # 0.05 E9/L    Lymphocytes # 1.31 (L) 1.50 - 4.00 E9/L    Monocytes # 0.94 0.10 - 0.95 E9/L    Eosinophils # 0.01 (L) 0.05 - 0.50 E9/L    Basophils # 0.01 0.00 - 0.20 E9/L           Assessment:     Active Problems:    Perforated viscus    Peritonitis (HCC)    Leukocytosis    A-fib (HCC)    Hypokalemia    Peripheral Edema    Plan:     Decrease ivf. Zosyn per surgery. Will give Lasix. Present pain control. Follow labs and vitals closely. Hold DOAC. lovenox for now. Advance diet when ok with surgery. SAR when ok with surgery.

## 2016-02-24 NOTE — Care Coordination-Inpatient (Signed)
Social work / Discharge Planning:       [facility] prior to [employer] approval as part of the "early out program".   EPIC N-17 completed for the social work section.   [id number] and ambulette form completed and in envelope in the light chart.   Electronically signed by Clydene PughMelissa E Camyla Camposano, LSW on 02/24/2016 at 1:58 PM

## 2016-02-24 NOTE — Plan of Care (Signed)
Problem: Nutrition  Goal: Optimal nutrition therapy  Outcome: Ongoing  Nutrition Problem: Moderate malnutrition, in context of acute illness or injury  Intervention: Food and/or Nutrient Delivery: Start oral diet, Start ONS  Nutritional

## 2016-02-24 NOTE — Progress Notes (Signed)
02/24/2016  12:34 PM        Nutrition Assessment    Type and Reason for Visit: Initial (NPO x 5days)    Nutrition Recommendations: Begin PO diet, when medically feasible.  Will initiate ONS at that time.    Malnutrition Assessment:  ?? Malnutrition Status: Meets the criteria for moderate malnutrition  ?? Context: Acute illness or injury  ?? Findings of the 6 clinical characteristics of malnutrition (Minimum of 2 out of 6 clinical characteristics is required to make the diagnosis of moderate or severe Protein Calorie Malnutrition based on AND/ASPEN Guidelines):  1. Energy Intake-Less than or equal to 50%, greater than or equal to 5 days    2. Weight Loss-No significant weight loss,    3. Fat Loss-Mild subcutaneous fat loss, Orbital  4. Muscle Loss- , Clavicles (pectoralis and deltoids)  5. Fluid Accumulation-Mild fluid accumulation, Extremities  6. Grip Strength-Not measured    Nutrition Diagnosis:   ?? Problem: Moderate malnutrition, in context of acute illness or injury  ?? Etiology: related to Alteration in GI function    ??? Signs and symptoms:  as evidenced by NPO status due to medical condition, Intake 0-25%, GI abnormality (mild subcutaneous fat loss)    Nutrition Assessment:  ?? Nutrition-Focused Physical Findings: abdominal pain PTA-improved, some AMS-improved, +1 edema, hypoactive BS  ?? Wound Type: Surgical Wound (lap sites and JP drain)  ?? Current Nutrition Therapies:  ?? Oral Diet Orders: NPO   ?? Oral Diet intake: NPO  ?? Oral Nutrition Supplement (ONS) Orders: None  ?? ONS intake: NPO  ?? Anthropometric Measures:  ?? Ht: 5\' 4"  (162.6 cm)   ?? Current Body Wt: 163 lb (73.9 kg) (10/31 bedscale)  ?? Admission Body Wt: 164 lb (74.4 kg) (first measured wt this admit)  ?? Usual Body Wt: 167 lb (75.8 kg) (per EMR hx ~1 yr ago)  ?? % Weight Change: not significant,     ?? Ideal Body Wt: 120 lb (54.4 kg), % Ideal Body 136%  ?? BMI Classification: BMI 25.0 - 29.9 Overweight  ?? Comparative Standards (Estimated Nutrition  Needs):  ?? Estimated Daily Total Kcal: 14-1500  ?? Estimated Daily Protein (g): 90-105    Estimated Intake vs Estimated Needs: Intake Less Than Needs    Nutrition Risk Level: Moderate    Nutrition Interventions:   Start oral diet, Start ONS  Continued Inpatient Monitoring, Education not appropriate at this time    Nutrition Evaluation:   ?? Goals:    Pt tolerate PO diet progression with intake at least 50% , stable dry wt,   ?? Monitoring: Diet Progression, NPO Status, Meal Intake, Supplement Intake, Fluid Balance, Ascites/Edema, Weight, Comparative Standards, Pertinent Labs    See Adult Nutrition Doc Flowsheet for more detail.     Electronically signed by Esperanza Heirianne Vang Kraeger, RD, CNSC, LD on 02/24/16 at 12:34 PM    Contact Number: [phone]

## 2016-02-24 NOTE — Progress Notes (Signed)
-  SIRS of non-infectious origin w/o acute organ dysfunction in the setting of perforated gastric ulcer being treated with Laparoscopic repair of perforated ulcer with omental Cheree DittoGraham patch   -Other explanation of clinical findings.  -Unable to determine (no explanation for clinical findings).    The medical record reflects the following:  ?? Risk Factors: perforated gastric ulcer  ?? Clinical Indicators: WBC 19.2-30.6, neutrophils 16.84-27.73, lactic acid 8.5-4.5, afebrile, HR low 100's  ?? Treatment: Laparoscopic repair of perforated ulcer with omental Cheree DittoGraham patch     Electronically signed by Augusto Gambleyan D Mckaila Duffus, MD on 02/24/2016 at 5:38 PM

## 2016-02-24 NOTE — Progress Notes (Addendum)
GENERAL SURGERY  DAILY PROGRESS NOTE  02/24/2016    Subjective:  No acute events  Pain controlled  NPO  Denied n/v    Objective:  BP (!) 109/58    Pulse 68    Temp 97.7 ??F (36.5 ??C) (Oral)    Resp 16    Ht 5\' 4"  (1.626 m)    Wt 163 lb 5.8 oz (74.1 kg)    SpO2 100%    BMI 28.04 kg/m??     General: NAD, awake and alert.   Head: Normocephalic, atraumatic  Eyes: PERRLA, EOMI.   Lungs: No increased work of breathing.   Cardiovascular: RRR.   Abdomen: Soft, ND, NT. Incisions c/d/i. JP drain intact. No rebound, guarding or rigidity.  Extremities: Atraumatic, full range of motion  Skin: Warm, dry and intact      Assessment/Plan:  82 y.o. female with perforated gastric ulcer s/p dx lap w/ graham patch 10/27    Pain and nausea control PRN  NPO  IVF's  UGI today.   OOB/AAT  DVT ppx    Electronically signed by Augusto Gambleyan D Konik, MD on 02/24/2016 at 8:53 AM     Normal UGI, start clears

## 2016-02-24 NOTE — Progress Notes (Signed)
Foley catheter removed - Patient due to void [id number].

## 2016-02-25 LAB — CBC WITH AUTO DIFFERENTIAL
Basophils %: 0.2 % (ref 0.0–2.0)
Basophils Absolute: 0.01 E9/L (ref 0.00–0.20)
Eosinophils %: 0.3 % (ref 0.0–6.0)
Eosinophils Absolute: 0.02 E9/L — ABNORMAL LOW (ref 0.05–0.50)
Hematocrit: 33.8 % — ABNORMAL LOW (ref 34.0–48.0)
Hemoglobin: 11.1 g/dL — ABNORMAL LOW (ref 11.5–15.5)
Immature Granulocytes #: 0.08 E9/L
Immature Granulocytes %: 1.2 % (ref 0.0–5.0)
Lymphocytes %: 24.4 % (ref 20.0–42.0)
Lymphocytes Absolute: 1.61 E9/L (ref 1.50–4.00)
MCH: 31.4 pg (ref 26.0–35.0)
MCHC: 32.8 % (ref 32.0–34.5)
MCV: 95.5 fL (ref 80.0–99.9)
MPV: 9.7 fL (ref 7.0–12.0)
Monocytes %: 14.4 % — ABNORMAL HIGH (ref 2.0–12.0)
Monocytes Absolute: 0.95 E9/L (ref 0.10–0.95)
Neutrophils %: 59.5 % (ref 43.0–80.0)
Neutrophils Absolute: 3.92 E9/L (ref 1.80–7.30)
Platelets: 287 E9/L (ref 130–450)
RBC: 3.54 E12/L (ref 3.50–5.50)
RDW: 14.4 fL (ref 11.5–15.0)
WBC: 6.6 E9/L (ref 4.5–11.5)

## 2016-02-25 LAB — COMPREHENSIVE METABOLIC PANEL
ALT: 29 U/L (ref 0–32)
AST: 43 U/L — ABNORMAL HIGH (ref 0–31)
Albumin: 2.7 g/dL — ABNORMAL LOW (ref 3.5–5.2)
Alkaline Phosphatase: 119 U/L — ABNORMAL HIGH (ref 35–104)
Anion Gap: 11 mmol/L (ref 7–16)
BUN: 25 mg/dL — ABNORMAL HIGH (ref 8–23)
CO2: 29 mmol/L (ref 22–29)
Calcium: 8.9 mg/dL (ref 8.6–10.2)
Chloride: 108 mmol/L — ABNORMAL HIGH (ref 98–107)
Creatinine: 0.7 mg/dL (ref 0.5–1.0)
GFR African American: >60
GFR Non-African American: >60 mL/min/{1.73_m2} (ref >=60)
Glucose: 108 mg/dL (ref 74–109)
Potassium: 3.1 mmol/L — ABNORMAL LOW (ref 3.5–5.0)
Sodium: 148 mmol/L — ABNORMAL HIGH (ref 132–146)
Total Bilirubin: 0.9 mg/dL (ref 0.0–1.2)
Total Protein: 5.7 g/dL — ABNORMAL LOW (ref 6.4–8.3)

## 2016-02-25 MED ORDER — POTASSIUM CHLORIDE CRYS ER 20 MEQ PO TBCR
20 MEQ | Freq: Once | ORAL | Status: AC
Start: 2016-02-25 — End: 2016-02-25
  Administered 2016-02-25: 18:00:00 20 meq via ORAL

## 2016-02-25 MED ORDER — MORPHINE SULFATE (PF) 2 MG/ML IV SOLN
2 MG/ML | INTRAVENOUS | Status: DC | PRN
Start: 2016-02-25 — End: 2016-02-26
  Administered 2016-02-26: 05:00:00 2 mg via INTRAVENOUS

## 2016-02-25 MED ORDER — MORPHINE SULFATE (PF) 2 MG/ML IV SOLN
2 MG/ML | INTRAVENOUS | Status: DC | PRN
Start: 2016-02-25 — End: 2016-02-26
  Administered 2016-02-26: 07:00:00 4 mg via INTRAVENOUS

## 2016-02-25 MED FILL — POTASSIUM CHLORIDE CRYS ER 20 MEQ PO TBCR: 20 MEQ | ORAL | Qty: 1

## 2016-02-25 MED FILL — NORMAL SALINE FLUSH 0.9 % IV SOLN: 0.9 % | INTRAVENOUS | Qty: 30

## 2016-02-25 MED FILL — ENOXAPARIN SODIUM 40 MG/0.4ML SC SOLN: 40 MG/0.4ML | SUBCUTANEOUS | Qty: 0.4

## 2016-02-25 MED FILL — PANTOPRAZOLE SODIUM 40 MG IV SOLR: 40 MG | INTRAVENOUS | Qty: 40

## 2016-02-25 MED FILL — ZOSYN 3-0.375 GM/50ML IV SOLN: INTRAVENOUS | Qty: 50

## 2016-02-25 MED FILL — NORMAL SALINE FLUSH 0.9 % IV SOLN: 0.9 % | INTRAVENOUS | Qty: 10

## 2016-02-25 MED FILL — MORPHINE SULFATE 2 MG/ML IJ SOLN: 2 mg/mL | INTRAMUSCULAR | Qty: 1

## 2016-02-25 MED FILL — MORPHINE SULFATE 2 MG/ML IJ SOLN: 2 MG/ML | INTRAMUSCULAR | Qty: 1

## 2016-02-25 MED FILL — FUROSEMIDE 10 MG/ML IJ SOLN: 10 MG/ML | INTRAMUSCULAR | Qty: 2

## 2016-02-25 MED FILL — NORMAL SALINE FLUSH 0.9 % IV SOLN: 0.9 % | INTRAVENOUS | Qty: 20

## 2016-02-25 MED FILL — FLUCONAZOLE IN SODIUM CHLORIDE 200-0.9 MG/100ML-% IV SOLN: INTRAVENOUS | Qty: 100

## 2016-02-25 MED FILL — SODIUM CHLORIDE 0.9 % IJ SOLN: 0.9 % | INTRAMUSCULAR | Qty: 10

## 2016-02-25 MED FILL — ONDANSETRON HCL 4 MG/2ML IJ SOLN: 4 MG/2ML | INTRAMUSCULAR | Qty: 2

## 2016-02-25 MED FILL — MORPHINE SULFATE 2 MG/ML IJ SOLN: 2 MG/ML | INTRAMUSCULAR | Qty: 2

## 2016-02-25 NOTE — Progress Notes (Signed)
Admit Date: 02/19/2016  Hospital day 6    Subjective:     Patient resting in chair in NAD. Feels well today. Appropriate.    Objective:       I/O last 3 completed shifts:  In: -   Out: 1925 [Drains:1925]      BP 137/67    Pulse 66    Temp 97.2 ??F (36.2 ??C) (Oral)    Resp 16    Ht 5\' 4"  (1.626 m)    Wt 160 lb 3.2 oz (72.7 kg)    SpO2 98%    BMI 27.50 kg/m??   General appearance: alert, appears stated age, cooperative and no distress  Lungs: clear to auscultation bilaterally  Heart: irregularly irregular rhythm. 2/6 sys murmur  Abdomen: diffuse tenderness with gaurding. BS present  Extremities: edema 1+ BLE. improved  Skin: Skin color, texture, turgor normal. No rashes or lesions       Data ReviewCBC:       Recent Results (from the past 24 hour(s))   Comprehensive metabolic panel    Collection Time: 02/25/16  7:25 AM   Result Value Ref Range    Sodium 148 (H) 132 - 146 mmol/L    Potassium 3.1 (L) 3.5 - 5.0 mmol/L    Chloride 108 (H) 98 - 107 mmol/L    CO2 29 22 - 29 mmol/L    Anion Gap 11 7 - 16 mmol/L    Glucose 108 74 - 109 mg/dL    BUN 25 (H) 8 - 23 mg/dL    CREATININE 0.7 0.5 - 1.0 mg/dL    GFR Non-African American >60 >=60 mL/min/1.73    GFR African American >60     Calcium 8.9 8.6 - 10.2 mg/dL    Total Protein 5.7 (L) 6.4 - 8.3 g/dL    Alb 2.7 (L) 3.5 - 5.2 g/dL    Total Bilirubin 0.9 0.0 - 1.2 mg/dL    Alkaline Phosphatase 119 (H) 35 - 104 U/L    ALT 29 0 - 32 U/L    AST 43 (H) 0 - 31 U/L   CBC auto differential    Collection Time: 02/25/16  7:25 AM   Result Value Ref Range    WBC 6.6 4.5 - 11.5 E9/L    RBC 3.54 3.50 - 5.50 E12/L    Hemoglobin 11.1 (L) 11.5 - 15.5 g/dL    Hematocrit 40.933.8 (L) 34.0 - 48.0 %    MCV 95.5 80.0 - 99.9 fL    MCH 31.4 26.0 - 35.0 pg    MCHC 32.8 32.0 - 34.5 %    RDW 14.4 11.5 - 15.0 fL    Platelets 287 130 - 450 E9/L    MPV 9.7 7.0 - 12.0 fL    Neutrophils % 59.5 43.0 - 80.0 %    Immature Granulocytes % 1.2 0.0 - 5.0 %    Lymphocytes % 24.4 20.0 - 42.0 %    Monocytes % 14.4 (H) 2.0 -  12.0 %    Eosinophils % 0.3 0.0 - 6.0 %    Basophils % 0.2 0.0 - 2.0 %    Neutrophils # 3.92 1.80 - 7.30 E9/L    Immature Granulocytes # 0.08 E9/L    Lymphocytes # 1.61 1.50 - 4.00 E9/L    Monocytes # 0.95 0.10 - 0.95 E9/L    Eosinophils # 0.02 (L) 0.05 - 0.50 E9/L    Basophils # 0.01 0.00 - 0.20 E9/L  Assessment:     Active Problems:    Perforated viscus    Peritonitis (HCC)    Leukocytosis    A-fib (HCC)    Moderate protein-calorie malnutrition (HCC)    Hypokalemia    Peripheral Edema    Plan:     Decrease ivf. Zosyn per surgery. Will give Lasix. Present pain control. Follow labs and vitals closely. Hold DOAC. lovenox for now. Advance diet when ok with surgery.  Admit Date: 02/19/2016  Hospital day 4    Subjective:     Patient resting in chair in NAD. Feeling better today and appears to be more appropriate.     Objective:       I/O last 3 completed shifts:  In: -   Out: 1925 [Drains:1925]      BP 137/67    Pulse 66    Temp 97.2 ??F (36.2 ??C) (Oral)    Resp 16    Ht 5\' 4"  (1.626 m)    Wt 160 lb 3.2 oz (72.7 kg)    SpO2 98%    BMI 27.50 kg/m??   General appearance: alert, appears stated age, cooperative, no distress and slowed mentation  Lungs: clear to auscultation bilaterally  Heart: irregularly irregular rhythm  Abdomen: diffuse tenderness with gaurding. BS present  Extremities: edema 1+ BLE  Skin: Skin color, texture, turgor normal. No rashes or lesions       Data ReviewCBC:       Recent Results (from the past 24 hour(s))   Comprehensive metabolic panel    Collection Time: 02/25/16  7:25 AM   Result Value Ref Range    Sodium 148 (H) 132 - 146 mmol/L    Potassium 3.1 (L) 3.5 - 5.0 mmol/L    Chloride 108 (H) 98 - 107 mmol/L    CO2 29 22 - 29 mmol/L    Anion Gap 11 7 - 16 mmol/L    Glucose 108 74 - 109 mg/dL    BUN 25 (H) 8 - 23 mg/dL    CREATININE 0.7 0.5 - 1.0 mg/dL    GFR Non-African American >60 >=60 mL/min/1.73    GFR African American >60     Calcium 8.9 8.6 - 10.2 mg/dL    Total Protein 5.7 (L) 6.4 -  8.3 g/dL    Alb 2.7 (L) 3.5 - 5.2 g/dL    Total Bilirubin 0.9 0.0 - 1.2 mg/dL    Alkaline Phosphatase 119 (H) 35 - 104 U/L    ALT 29 0 - 32 U/L    AST 43 (H) 0 - 31 U/L   CBC auto differential    Collection Time: 02/25/16  7:25 AM   Result Value Ref Range    WBC 6.6 4.5 - 11.5 E9/L    RBC 3.54 3.50 - 5.50 E12/L    Hemoglobin 11.1 (L) 11.5 - 15.5 g/dL    Hematocrit 16.1 (L) 34.0 - 48.0 %    MCV 95.5 80.0 - 99.9 fL    MCH 31.4 26.0 - 35.0 pg    MCHC 32.8 32.0 - 34.5 %    RDW 14.4 11.5 - 15.0 fL    Platelets 287 130 - 450 E9/L    MPV 9.7 7.0 - 12.0 fL    Neutrophils % 59.5 43.0 - 80.0 %    Immature Granulocytes % 1.2 0.0 - 5.0 %    Lymphocytes % 24.4 20.0 - 42.0 %    Monocytes % 14.4 (H) 2.0 - 12.0 %    Eosinophils %  0.3 0.0 - 6.0 %    Basophils % 0.2 0.0 - 2.0 %    Neutrophils # 3.92 1.80 - 7.30 E9/L    Immature Granulocytes # 0.08 E9/L    Lymphocytes # 1.61 1.50 - 4.00 E9/L    Monocytes # 0.95 0.10 - 0.95 E9/L    Eosinophils # 0.02 (L) 0.05 - 0.50 E9/L    Basophils # 0.01 0.00 - 0.20 E9/L           Assessment:     Active Problems:    Perforated viscus    Peritonitis (HCC)    Leukocytosis    A-fib (HCC)    Moderate protein-calorie malnutrition (HCC)    Hypokalemia    Peripheral Edema    Plan:     Hold ivf. Zosyn per surgery. hold Lasix. Replace K+. Present pain control. Follow labs and vitals closely. Hold DOAC. lovenox for now. Advance diet when ok with surgery. SAR when ok with surgery.

## 2016-02-25 NOTE — Progress Notes (Addendum)
GENERAL SURGERY  DAILY PROGRESS NOTE  02/25/2016    Subjective:  No acute events  Pain controlled  CLD  Denied n/v    Objective:  BP (!) 127/54    Pulse 69    Temp 97.6 ??F (36.4 ??C) (Oral)    Resp 16    Ht 5\' 4"  (1.626 m)    Wt 160 lb 3.2 oz (72.7 kg)    SpO2 97%    BMI 27.50 kg/m??     General: NAD, awake and alert.   Head: Normocephalic, atraumatic  Eyes: PERRLA, EOMI.   Lungs: No increased work of breathing.   Cardiovascular: RRR.   Abdomen: Soft, ND, NT. Incisions c/d/i. JP drain intact. No rebound, guarding or rigidity.  Extremities: Atraumatic, full range of motion  Skin: Warm, dry and intact      Assessment/Plan:  82 y.o. female with perforated gastric ulcer s/p dx lap w/ graham patch 10/27    Pain and nausea control PRN  CLD  OOB/AAT  DVT ppx    Electronically signed by Augusto Gambleyan D Konik, MD on 02/25/2016 at 5:33 AM     Will plan for podiatry consult and will discharge pt tomorrow

## 2016-02-25 NOTE — Progress Notes (Signed)
[facility] Quality Flow/Interdisciplinary Rounds Progress Note        Quality Flow Rounds held on February 25, 2016    Disciplines Attending:  Bedside Nurse, Social Worker, Case Manager and Nursing Unit Leadership    [name] was admitted on 02/19/2016  3:55 PM    Anticipated Discharge Date:  Expected Discharge Date: 02/24/16    Disposition:    Braden Score:  Braden Scale Score: 20    Readmission Risk              Readmission Risk:        14.75       Age 80 or Greater:  1    Admitted from SNF or Requires Paid or Family Care:  0    Currently has CHF,COPD,ARF,CRI,or is on dialysis:  4    Takes more than 5 Prescription Medications:  4    Takes Digoxin,Insulin,Anticoagulants,Narcotics or ASA/Plavix:  2    Hospital Admit in Past 12 Months:  0    On Disability:  0    Patient Considers own Health:  3.75          Discussed patient goal for the day, patient clinical progression, and barriers to discharge.  The following Goal(s) of the Day/Commitment(s) have been identified:  Start clear liquids      Michelle GlazierDeena M [name]  February 25, 2016

## 2016-02-25 NOTE — Care Coordination-Inpatient (Signed)
Social work / Discharge Planning:       Engineer, civil (consulting)recert approved for patient to discharge to [facility] when medically stable.  RN updated  Electronically signed by Clydene PughMelissa E Harwood Nall, LSW on 02/25/2016 at 1:34 PM

## 2016-02-26 LAB — CBC WITH AUTO DIFFERENTIAL
Basophils %: 0.2 % (ref 0.0–2.0)
Basophils Absolute: 0.01 E9/L (ref 0.00–0.20)
Eosinophils %: 1.3 % (ref 0.0–6.0)
Eosinophils Absolute: 0.08 E9/L (ref 0.05–0.50)
Hematocrit: 33.7 % — ABNORMAL LOW (ref 34.0–48.0)
Hemoglobin: 11.1 g/dL — ABNORMAL LOW (ref 11.5–15.5)
Immature Granulocytes #: 0.06 E9/L
Immature Granulocytes %: 1 % (ref 0.0–5.0)
Lymphocytes %: 27.1 % (ref 20.0–42.0)
Lymphocytes Absolute: 1.64 E9/L (ref 1.50–4.00)
MCH: 31.5 pg (ref 26.0–35.0)
MCHC: 32.9 % (ref 32.0–34.5)
MCV: 95.7 fL (ref 80.0–99.9)
MPV: 9.8 fL (ref 7.0–12.0)
Monocytes %: 12.1 % — ABNORMAL HIGH (ref 2.0–12.0)
Monocytes Absolute: 0.73 E9/L (ref 0.10–0.95)
Neutrophils %: 58.3 % (ref 43.0–80.0)
Neutrophils Absolute: 3.53 E9/L (ref 1.80–7.30)
Platelets: 235 E9/L (ref 130–450)
RBC: 3.52 E12/L (ref 3.50–5.50)
RDW: 14.5 fL (ref 11.5–15.0)
WBC: 6.1 E9/L (ref 4.5–11.5)

## 2016-02-26 LAB — COMPREHENSIVE METABOLIC PANEL
ALT: 30 U/L (ref 0–32)
AST: 44 U/L — ABNORMAL HIGH (ref 0–31)
Albumin: 2.6 g/dL — ABNORMAL LOW (ref 3.5–5.2)
Alkaline Phosphatase: 112 U/L — ABNORMAL HIGH (ref 35–104)
Anion Gap: 10 mmol/L (ref 7–16)
BUN: 18 mg/dL (ref 8–23)
CO2: 31 mmol/L — ABNORMAL HIGH (ref 22–29)
Calcium: 8.5 mg/dL — ABNORMAL LOW (ref 8.6–10.2)
Chloride: 107 mmol/L (ref 98–107)
Creatinine: 0.7 mg/dL (ref 0.5–1.0)
GFR African American: >60
GFR Non-African American: >60 mL/min/{1.73_m2} (ref >=60)
Glucose: 107 mg/dL (ref 74–109)
Potassium: 2.5 mmol/L — CL (ref 3.5–5.0)
Sodium: 148 mmol/L — ABNORMAL HIGH (ref 132–146)
Total Bilirubin: 0.8 mg/dL (ref 0.0–1.2)
Total Protein: 5.2 g/dL — ABNORMAL LOW (ref 6.4–8.3)

## 2016-02-26 LAB — BASIC METABOLIC PANEL
Anion Gap: 9 mmol/L (ref 7–16)
BUN: 16 mg/dL (ref 8–23)
CO2: 32 mmol/L — ABNORMAL HIGH (ref 22–29)
Calcium: 8.8 mg/dL (ref 8.6–10.2)
Chloride: 105 mmol/L (ref 98–107)
Creatinine: 0.8 mg/dL (ref 0.5–1.0)
GFR African American: >60
GFR Non-African American: >60 mL/min/{1.73_m2} (ref >=60)
Glucose: 116 mg/dL — ABNORMAL HIGH (ref 74–109)
Potassium: 3.2 mmol/L — ABNORMAL LOW (ref 3.5–5.0)
Sodium: 146 mmol/L (ref 132–146)

## 2016-02-26 LAB — POTASSIUM: Potassium: 2.8 mmol/L — ABNORMAL LOW (ref 3.5–5.0)

## 2016-02-26 MED ORDER — HYDROCODONE-ACETAMINOPHEN 5-325 MG PO TABS
5-325 MG | ORAL_TABLET | Freq: Four times a day (QID) | ORAL | 0 refills | Status: AC | PRN
Start: 2016-02-26 — End: 2016-03-04

## 2016-02-26 MED ORDER — POTASSIUM CHLORIDE CRYS ER 20 MEQ PO TBCR
20 MEQ | Freq: Once | ORAL | Status: AC
Start: 2016-02-26 — End: 2016-02-26
  Administered 2016-02-26: 10:00:00 40 meq via ORAL

## 2016-02-26 MED ORDER — AMOXICILLIN-POT CLAVULANATE 875-125 MG PO TABS
875-125 MG | ORAL_TABLET | Freq: Two times a day (BID) | ORAL | 0 refills | Status: AC
Start: 2016-02-26 — End: 2016-03-07

## 2016-02-26 MED ORDER — ONDANSETRON HCL 4 MG PO TABS
4 MG | ORAL_TABLET | Freq: Three times a day (TID) | ORAL | 0 refills | Status: AC | PRN
Start: 2016-02-26 — End: ?

## 2016-02-26 MED ORDER — FLUCONAZOLE 100 MG PO TABS
100 MG | ORAL_TABLET | Freq: Every day | ORAL | 0 refills | Status: AC
Start: 2016-02-26 — End: 2016-03-04

## 2016-02-26 MED ORDER — KCL IN DEXTROSE-NACL 40-5-0.45 MEQ/L-%-% IV SOLN
INTRAVENOUS | Status: DC
Start: 2016-02-26 — End: 2016-02-26
  Administered 2016-02-26: 16:00:00 via INTRAVENOUS

## 2016-02-26 MED ORDER — DOCUSATE SODIUM 100 MG PO CAPS
100 MG | ORAL_CAPSULE | Freq: Two times a day (BID) | ORAL | 0 refills | Status: AC
Start: 2016-02-26 — End: 2016-03-11

## 2016-02-26 MED FILL — NORMAL SALINE FLUSH 0.9 % IV SOLN: 0.9 % | INTRAVENOUS | Qty: 20

## 2016-02-26 MED FILL — KCL IN DEXTROSE-NACL 40-5-0.45 MEQ/L-%-% IV SOLN: INTRAVENOUS | Qty: 1000

## 2016-02-26 MED FILL — SODIUM CHLORIDE 0.9 % IJ SOLN: 0.9 % | INTRAMUSCULAR | Qty: 10

## 2016-02-26 MED FILL — ZOSYN 3-0.375 GM/50ML IV SOLN: INTRAVENOUS | Qty: 50

## 2016-02-26 MED FILL — FLUCONAZOLE IN SODIUM CHLORIDE 200-0.9 MG/100ML-% IV SOLN: INTRAVENOUS | Qty: 100

## 2016-02-26 MED FILL — PANTOPRAZOLE SODIUM 40 MG IV SOLR: 40 MG | INTRAVENOUS | Qty: 40

## 2016-02-26 MED FILL — ONDANSETRON HCL 4 MG/2ML IJ SOLN: 4 MG/2ML | INTRAMUSCULAR | Qty: 2

## 2016-02-26 MED FILL — NORMAL SALINE FLUSH 0.9 % IV SOLN: 0.9 % | INTRAVENOUS | Qty: 10

## 2016-02-26 MED FILL — MORPHINE SULFATE (PF) 2 MG/ML IV SOLN: 2 mg/mL | INTRAVENOUS | Qty: 1

## 2016-02-26 MED FILL — FLUCELVAX QUADRIVALENT 0.5 ML IM SUSY: 0.5 ML | INTRAMUSCULAR | Qty: 0.5

## 2016-02-26 MED FILL — ENOXAPARIN SODIUM 40 MG/0.4ML SC SOLN: 40 MG/0.4ML | SUBCUTANEOUS | Qty: 0.4

## 2016-02-26 MED FILL — POTASSIUM CHLORIDE CRYS ER 20 MEQ PO TBCR: 20 MEQ | ORAL | Qty: 2

## 2016-02-26 MED FILL — MORPHINE SULFATE (PF) 2 MG/ML IV SOLN: 2 mg/mL | INTRAVENOUS | Qty: 2

## 2016-02-26 NOTE — Progress Notes (Signed)
Call placed to Dr. Daisy LazarGallo's answering service regarding critical potassium of 2.5. Await call back/orders.

## 2016-02-26 NOTE — Discharge Instructions (Signed)
[facility] Continuity of Care Form    Patient Name: [name]   DOB:  01/10/1928  MRN:  [id number]    Admit date:  02/19/2016  Discharge date: 02/26/2016    Code Status Order: Full Code   Advance Directives:    Admitting Physician:  Willadean CarolJoseph Yurich, MD  PCP: Otilio SaberJoseph J Gallo, DO    Discharging Nurse:RK  Discharging Hospital Unit/Room#: 0607/0607-A  Discharging Unit Phone Number: [phone]  Emergency Contact:   Contact 1: Name: Tommas OlpCarol Christoff  Contact 1: Number: [phone]  Contact 1: Relationship: daughter    Past Surgical History:  Past Surgical History:   Procedure Laterality Date   . ABDOMEN SURGERY  02/19/2016    laprascopic with graham patch of gastric ulcer   . BACK SURGERY  2010?    lumbar    . BREAST SURGERY      Lumpectomy   . ENDOSCOPY, COLON, DIAGNOSTIC  02/28/2015   . HYSTERECTOMY  1970's   . JOINT REPLACEMENT Bilateral 2011?    knee   . VARICOSE VEIN SURGERY Bilateral 2012       Immunization History:   There is no immunization history for the selected administration types on file for this patient.    Active Problems:  Patient Active Problem List   Diagnosis   . Perforated viscus   . Peritonitis (HCC)   . Leukocytosis   . A-fib (HCC)   . Moderate protein-calorie malnutrition (HCC)       Isolation/Infection:   Isolation          No Isolation            Nurse Assessment:  Last Vital Signs: BP (!) 109/58   Pulse 68   Temp 97.7 F (36.5 C) (Oral)   Resp 16   Ht 5\' 4"  (1.626 m)   Wt 163 lb 5.8 oz (74.1 kg)   SpO2 100%   BMI 28.04 kg/m     Last documented pain score (0-10 scale): Pain Level: 0  Last Weight:   Wt Readings from Last 1 Encounters:   02/24/16 163 lb 5.8 oz (74.1 kg)     Mental Status:  Alert, Confused at times, Very hard of hearing    IV Access:  Removed     Nursing Mobility/ADLs:  Walking   Assist  Transfer  Assist  Bathing  Assist  Dressing  Assist  Toileting  Assist  Feeding  Assist  Med Admin Assist  Med Delivery   Assist  Wound Care Documentation and Therapy:  Incision 02/19/16  Abdomen Anterior (Active)   Wound Assessment Clean;Dry;Intact 02/24/2016 12:08 AM   Closure Approximated;Surgical glue 02/24/2016 12:08 AM   Drainage Amount None 02/24/2016 12:08 AM   Odor None 02/24/2016 12:08 AM   Dressing/Treatment Open to air 02/24/2016 12:08 AM   Dressing Status Clean;Dry;Intact 02/24/2016 12:08 AM   Number of days: 4        Elimination:  Continence:    Bowel: Continent, Incontinent at times    Bladder: Continent, Incontinent at times   Urinary Catheter:None  Colostomy/Ileostomy/Ileal Conduit: None    Date of Last BM: 02/25/2016    Intake/Output Summary (Last 24 hours) at 02/24/16 1358  Last data filed at 02/24/16 1313   Gross per 24 hour   Intake                0 ml   Output             3310 ml   Net            -  3310 ml     I/O last 3 completed shifts:  In: -   Out: 2180 [Drains:2180]    Safety Concerns:     Unsteady, Confusion    Impairments/Disabilities:      Weakness  Nutrition Therapy:  Current Nutrition Therapy: Peptic Ulcer Diet    Routes of Feeding: Oral  Liquids: Regular  Daily Fluid Restriction: None  Last Modified Barium Swallow with Video (Video Swallowing Test):Not Done    Treatments at the Time of Hospital Discharge:   Respiratory Treatments: ***  Oxygen Therapy:  Room Air  Ventilator: None    Rehab Therapies: Physical Therapy and Occupational Therapy  Weight Bearing Status/Restrictions: {MH CC Weight Bearing:304508812}  Other Medical Equipment (for information only, NOT a DME order):  {EQUIPMENT:304520077}  Other Treatments: ***    Patient's personal belongings (please select all that are sent with patient):  {[facility] DME Belongings:304088044}    RN SIGNATURE:  Electronically signed by Britt Bottomobert Juliene Kirsh, RN on 02/26/16 at 5:05 PM    PHYSICIAN SECTION    Prognosis: Good    Condition at Discharge: Stable    Rehab Potential (if transferring to Rehab): Good    Recommended Labs or Other Treatments After Discharge: follow up  Surgery in 2 weeks, no further labs or imaging    Physician  Certification: I certify the above information and transfer of [name]  is necessary for the continuing treatment of the diagnosis listed and that she requires Skilled Nursing Facility for less 30 days.     Update Admission H&P: No change in H&P    PHYSICIAN SIGNATURE:  Electronically signed by Lorette AngJoseph F Yurich, MD on 02/26/16 at 7:25 AM    CASE MANAGEMENT/SOCIAL WORK SECTION    Inpatient Status Date: ***    [facility] Readmission Risk Assessment Score:  Risk Score: 14.75   (Score > 14= high risk for readmission)    Discharging to Facility/ Agency    Name: [facility]   [address]  [place], [place] [address]   Phone:[phone]   Fax:[phone]    Dialysis Facility (if applicable)    Name:   Address:   Dialysis Schedule:   Phone:   Fax:    Case Manager/Social Worker signature: Electronically signed by Clydene PughMelissa E Mordocco, LSW on 02/24/16 at 1:59 PM

## 2016-02-26 NOTE — Progress Notes (Signed)
Occupational Therapy  OCCUPATIONAL THERAPY DAILY NOTE    Date:02/26/2016  Patient Name: [name]  MRN: [id number]  DOB: [birth year]  Room: 0607/0607-A     Patient Active Problem List   Diagnosis   ??? Perforated viscus   ??? Peritonitis (HCC)   ??? Leukocytosis   ??? A-fib (HCC)   ??? Moderate protein-calorie malnutrition (HCC)       Subjective:  Pt laying in the bed. Pleasant and confused.   Precautions: falls, drain, splinting  Chart Reviewed:  Yes          Independent Supervision Contact Guard Assist Minimal Assist Moderate Assist Maximum Assist Dependent   Feeding          Grooming    x      UE  Bathing          LE Bathing          UE Dressing          LE  Dressing                x    Toileting               x x   Comments:  Pt incontinent of bowel and bladder.  Mod A to arrange clothing up and down.  Max A to thread clean brief over feet.  Max A for hygiene.     Functional Transfers:  Supine to sit onto the side of the bed min A.  Sit balance min A.  Transfer from bed, chair, and BSC min A. Cues for hand placement and safety.  Functional mobility min A hand held assist in room.  Pt remained in chair at end of the session. Alarm activated.     Therapeutic Exercises:  Good use of UE's during functional activity    Other:  Pt confused and hard of hearing.  Increased time and directions required during activity.     Education:  Daily orientation, transfer safety    Equipment Recommendations:  Continue to assess   Placement Recommendations:  snf     AM-PAC Inpatient Daily Activity Raw Score:  15    Pain Level: /10   Additional Notes:  No complaint during session  Patient has made  progress during treatment sessions toward set goals.   [x]  Continue with current OT Plan of care.  []  Prepare for Discharge    Candee FurbishSeari Lynn Caterine Mcmeans OTA/L [phone]    Total Tx Time: 4624

## 2016-02-26 NOTE — Progress Notes (Signed)
Pt stable for discharge today, K correcting, will d/c to rehab if ok with consultants

## 2016-02-26 NOTE — Consults (Signed)
[name] is a 82 y.o. female is consulted to the podiatry service for pre tibial edema, callus and nail care. The patient relates that she has trouble cutting her nails. She states that she develops painful calluses to bilateral feet as well. She has no other pedal complaints. She denies nausea, vomitting, fever or chills or shortness of breath.     Past Medical History:   Diagnosis Date   ??? A-fib (HCC)    ??? Anticoagulated     on pradaxa   ??? Asthma    ??? COPD (chronic obstructive pulmonary disease) (HCC)    ??? Depression    ??? Forgetfulness     capable of signing own consents   ??? GERD (gastroesophageal reflux disease)    ??? HOH (hard of hearing)     no aides   ??? Hyperlipidemia    ??? Pain     bilateral upper quadrant /  for EGD   ??? Restless leg syndrome    ??? Thyroid disease        Past Surgical History:   Procedure Laterality Date   ??? ABDOMEN SURGERY  02/19/2016    laprascopic with graham patch of gastric ulcer   ??? BACK SURGERY  2010?    lumbar    ??? BREAST SURGERY      Lumpectomy   ??? ENDOSCOPY, COLON, DIAGNOSTIC  02/28/2015   ??? HYSTERECTOMY  1970's   ??? JOINT REPLACEMENT Bilateral 2011?    knee   ??? VARICOSE VEIN SURGERY Bilateral 2012       History reviewed. No pertinent family history.    Prior to Admission medications    Medication Sig Start Date End Date Taking? Authorizing Provider   HYDROcodone-acetaminophen (NORCO) 5-325 MG per tablet Take 1 tablet by mouth every 6 hours as needed for Pain . 02/26/16 03/04/16 Yes Willadean CarolJoseph Yurich, MD   docusate sodium (COLACE) 100 MG capsule Take 1 capsule by mouth 2 times daily for 14 days 02/26/16 03/11/16 Yes Willadean CarolJoseph Yurich, MD   ondansetron [facility](ZOFRAN) 4 MG tablet Take 1 tablet by mouth every 8 hours as needed for Nausea or Vomiting 02/26/16  Yes Willadean CarolJoseph Yurich, MD   amoxicillin-clavulanate (AUGMENTIN) 875-125 MG per tablet Take 1 tablet by mouth 2 times daily for 10 days 02/26/16 03/07/16 Yes Willadean CarolJoseph Yurich, MD   fluconazole (DIFLUCAN) 100 MG tablet Take 1 tablet by mouth daily for 7  days 02/26/16 03/04/16 Yes Willadean CarolJoseph Yurich, MD   albuterol sulfate HFA 108 (90 BASE) MCG/ACT inhaler Inhale 2 puffs into the lungs every 6 hours as needed for Wheezing (last took this am)   Yes Historical Provider, MD   omeprazole (PRILOSEC) 40 MG delayed release capsule Take 1 capsule by mouth daily 02/28/15  Yes Erling ConteJoseph A Ambrose, MD   MULTIPLE VITAMIN PO Take by mouth daily   Yes Historical Provider, MD   buPROPion (WELLBUTRIN SR) 150 MG extended release tablet Take 150 mg by mouth nightly   Yes Historical Provider, MD   donepezil (ARICEPT) 10 MG tablet Take 10 mg by mouth nightly   Yes Historical Provider, MD   simvastatin (ZOCOR) 20 MG tablet Take 20 mg by mouth nightly.     Yes Historical Provider, MD   levothyroxine (SYNTHROID) 50 MCG tablet Take 50 mcg by mouth daily.     Yes Historical Provider, MD   citalopram (CELEXA) 20 MG tablet Take 20 mg by mouth daily Instructed to take with sip water am of procedure   Yes Historical Provider,  MD   montelukast (SINGULAIR) 10 MG tablet Take 10 mg by mouth nightly.     Yes Historical Provider, MD   raloxifene (EVISTA) 60 MG tablet Take 60 mg by mouth daily.     Yes Historical Provider, MD   digoxin (LANOXIN) 0.125 MG tablet Take 125 mcg by mouth every 48 hours Takes at night every other day   Yes Historical Provider, MD   dabigatran (PRADAXA) 150 MG capsule Take 150 mg by mouth 2 times daily.     Yes Historical Provider, MD   ipratropium-albuterol (DUONEB) 0.5-2.5 (3) MG/3ML SOLN nebulizer solution Inhale 1 vial into the lungs every 4 hours Uses prn / instructed if needs to use dos   Yes Historical Provider, MD   omeprazole (PRILOSEC) 20 MG delayed release capsule Take 2 capsules by mouth 2 times daily for 14 days 01/19/15 02/02/15  Delories Heinzaryl Donald, MD   pregabalin (LYRICA) 150 MG capsule Take 150 mg by mouth 2 times daily Instructed to take with sip water am of procedure    Historical Provider, MD         Aspirin; Chicken allergy; Eggs or egg-derived products; and Sulfa  antibiotics    Social History   Substance Use Topics   ??? Smoking status: Never Smoker   ??? Smokeless tobacco: Not on file   ??? Alcohol use No          Vitals:    02/26/16 1515   BP: (!) 105/51   Pulse: 67   Resp: 16   Temp: 98.2 ??F (36.8 ??C)   SpO2: 100%        Objective    Vasc:  DP/PT pulses palpable, bilateral feet. THere is pre tibial edema noted to bilateral lower extremities. No open wounds, lesions, no ascending erythema noted.     Derm: There is no open lesion noted to the bilateral  Foot. There is long, thickened toe nails noted to 1-5, bilateral. HPK noted sub 2/3 metatarsals bilateral.  Brawny discoloration noted to the distal apsects of bilateral lower extremities.      Neuro: Protective sensation is intact to the foot and ankle    Musc: Muscle strength is 3/5 to all the movers of the foot and ankle. There is noted equinus deformity    Radiology:  Ct Abdomen Pelvis W Iv Contrast Additional Contrast? Oral    Result Date: 02/19/2016  Patient MRN:  [id number] DOB: [birth year] Age: [age] years Gender: Female Order Date:  02/19/2016 2:30 PM EXAM: CT ABDOMEN PELVIS W IV CONTRAST NUMBER OF IMAGES \\ views:  325 INDICATION:  abdominal pain COMPARISON: None Technique: Low-dose CT  acquisition technique included one of following options; 1 . Automated exposure control, 2. Adjustment of MA and or KV according to patient's size or 3. Use of iterative reconstruction. Multiple computerized tomography sections of the abdomen with sagittal and coronal MPR reconstructions were obtained from the top of the diaphragm to the pelvis.  The visualized portions of the abdomen reveal: The lung bases are unremarkable. The liver is unremarkable. The spleen is unremarkable. The kidneys are unremarkable.  The adrenals is  unremarkable. The pancreas is unremarkable. The appendix is not seen The bowel is  abnormal. There is evidence for free air. There is evidence for cirrhosis. There is evidence for C ascites. There is pneumomediastinum  extending along the distal stomach. There is evidence for diverticulosis. There is a large hiatal hernia The bladder is unremarkable.     There is evidence for free air  Ascites Findings compatible with cirrhosis Diverticulosis Findings were called to Dr. Delories Heinz at the time of dictation.     Fl Ugi W Kub    Result Date: 02/24/2016  Patient MRN:  [id number] DOB: 1928/01/06 Age: 23 years Gender: Female Order Date:  02/24/2016 9:40 AM EXAM: FL UGI WITH KUB TECHNIQUE: An overview of the abdomen and pelvis was obtained. After ingestion of the Gastrografin contrast several overhead projections were obtained. This study was performed on supine position due to patient's inability to stand up. 18 images. Study is limited due to patient's inability to tolerate Gastrografin contrast (several episodes of vomiting). INDICATION:  Patient is an 82 year old woman with history of perforated prepyloric gastric ulcer, Graham patch repair evaluate for leak s/p graham patch , rule out leak. COMPARISON: CT abdomen pelvis February 19, 2016 FLUORO TIME: 3.4 minutes. FINDINGS: Initial normal renal of the abdomen and pelvis reveals nonobstructive abdominal gas pattern. No pneumoperitoneum. Vascular calcifications. Degenerative disc disease of the lumbar spine. After ingestion of the Gastrografin contrast: Esophagus is noted with no evidence for dilatation or narrowing. Small hiatal hernia. No evidence for mass or ulcerations. Visualized stomach has mildly thickened folds. No evidence for extravasation of the contrast to indicate a leak. No delay of transit of contrast from stomach to duodenum. Visualized duodenum is unremarkable.     1. No evidence for extravasation of the contrast to indicate a leak. 2. Small hiatal hernia. 3. Limited study due to patient's inability to tolerate Gastrografin contrast.    Xr Chest Portable    Result Date: 02/19/2016  Patient MRN: [id number] DOB: 1928/04/08 Age:  67 years Gender: Female Order Date: 02/19/2016  12:57 PM Exam: XR CHEST PORTABLE Number of Views: 1 Indication:   Chest pain Comparison: 09/07/2006 Findings: There is a stable, enlarged cardiomediastinal silhouette with curvilinear air underneath the left hemidiaphragm. Vascular calcifications thoracic aorta.. No pneumothorax.Marland Kitchen     ALERT:  THIS IS AN ABNORMAL REPORT Findings communicated directly with Dr. Dorinda Hill at approximately 02/19/2016 1:02 PM . 1. There is curvilinear air underneath the left hemidiaphragm. This raises concern for potential free intraperitoneal air although findings could also reflect air within the colon or stomach. Further evaluation with CT scan of the abdomen and pelvis is recommended. 2. Vascular calcifications thoracic aorta.        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 All questions were answered to patients satisfaction  - recommend compression therapy to bilateral lower extremities.  - SHarp debridement with nail nippers to bilateral nails 1-5 without incident.  - Sharp debridement of HPK , bilateral feet with #11 blade without incident.  - Ok to d/c from podiatry standpoint.  - She may follow up to our clinic for follow up  Care.      Thank you for the consult.    Berton Mount, DPM, AACFAS  Fellow, Reconstructive Rearfoot and Ankle Surgery  [facility]

## 2016-02-26 NOTE — Plan of Care (Signed)
Problem: Restraint Use - Nonviolent/Non-Self-Destructive Behavior:  Goal: Absence of restraint indications  Absence of restraint indications   Outcome: Met This Shift      Problem: Pain:  Goal: Pain level will decrease  Pain level will decrease   Outcome: Met This Shift      Problem: Risk for Impaired Skin Integrity  Goal: Tissue integrity - skin and mucous membranes  Structural intactness and normal physiological function of skin and  mucous membranes.   Outcome: Met This Shift

## 2016-02-26 NOTE — Progress Notes (Signed)
Call placed to [facility] will be here to pick up patient between 1900-1930, RN updated.  Electronically signed by Bryna ColanderPamela Celisse Ciulla, RN on 02/26/2016 at 5:12 PM

## 2016-02-26 NOTE — Discharge Summary (Signed)
Physician Discharge Summary     [name]  [id number]    Admit date: 02/19/2016    Discharge date and time: 02/26/2016  8:27 PM    Admitting Physician: Willadean CarolJoseph Yurich, MD     Admission Diagnoses:   Patient Active Problem List   Diagnosis   ??? Perforated viscus   ??? Peritonitis (HCC)   ??? Leukocytosis   ??? A-fib (HCC)   ??? Moderate protein-calorie malnutrition [facility](HCC)       Discharge Diagnoses:   Patient Active Problem List   Diagnosis   ??? Perforated viscus   ??? Peritonitis (HCC)   ??? Leukocytosis   ??? A-fib (HCC)   ??? Moderate protein-calorie malnutrition [facility](HCC)         Hospital Course: Michelle FillerRuth E Weckwerth is a 82 y.o. female who presented for evaluation of perforated gastric ulcer. Underwent diagnostic laparoscopy with graham patch repair 10/27 She had an otherwise uneventful course and progressed well. Pain was controlled. She was tolerating a regular diet with no nausea or vomiting, was ambulating well, and was in a suitable condition for discharge to home.     Lab Results   Component Value Date    WBC 6.1 02/26/2016    HGB 11.1 02/26/2016    PLT 235 02/26/2016    NA 146 02/26/2016    CL 105 02/26/2016    K 3.2 02/26/2016    BUN 16 02/26/2016    CREATININE 0.8 02/26/2016    GLUCOSE 116 02/26/2016    LABGLOM >60 02/26/2016    PROTIME 17.5 02/19/2016    INR 1.6 02/19/2016    LABALBU 2.6 02/26/2016    PROT 5.2 02/26/2016    CALCIUM 8.8 02/26/2016    MG 2.1 02/23/2016    PHOS 2.6 02/23/2016    BILITOT 0.8 02/26/2016    ALKPHOS 112 02/26/2016    AST 44 02/26/2016    ALT 30 02/26/2016       Discharge Exam:   VITALS: BP (!) 105/51    Pulse 67    Temp 98.2 ??F (36.8 ??C) (Oral)    Resp 16    Ht 5\' 4"  (1.626 m)    Wt 156 lb 9.6 oz (71 kg)    SpO2 100%    BMI 26.88 kg/m??     General: NAD, awake and alert.   Head: Normocephalic, atraumatic  Eyes: PERRLA, EOMI.   Lungs: No increased work of breathing.   Cardiovascular: RRR.   Abdomen: Soft, ND, NT. Incisions c/d/i. JP drain intact. No rebound, guarding or rigidity.  Extremities: Atraumatic,  full range of motion  Skin: Warm, dry and intact    Disposition: SAR       Medication List      START taking these medications    amoxicillin-clavulanate 875-125 MG per tablet  Commonly known as:  AUGMENTIN  Take 1 tablet by mouth 2 times daily for 10 days     docusate sodium 100 MG capsule  Commonly known as:  COLACE  Take 1 capsule by mouth 2 times daily for 14 days     fluconazole 100 MG tablet  Commonly known as:  DIFLUCAN  Take 1 tablet by mouth daily for 7 days     HYDROcodone-acetaminophen 5-325 MG per tablet  Commonly known as:  NORCO  Take 1 tablet by mouth every 6 hours as needed for Pain .     ondansetron 4 MG tablet  Commonly known as:  ZOFRAN  Take 1 tablet by mouth every 8  hours as needed for Nausea or Vomiting        CONTINUE taking these medications    albuterol sulfate HFA 108 (90 Base) MCG/ACT inhaler     buPROPion 150 MG extended release tablet  Commonly known as:  WELLBUTRIN SR     citalopram 20 MG tablet  Commonly known as:  CELEXA     dabigatran 150 MG capsule  Commonly known as:  PRADAXA     digoxin 125 MCG tablet  Commonly known as:  LANOXIN     donepezil 10 MG tablet  Commonly known as:  ARICEPT     ipratropium-albuterol 0.5-2.5 (3) MG/3ML Soln nebulizer solution  Commonly known as:  DUONEB     levothyroxine 50 MCG tablet  Commonly known as:  SYNTHROID     montelukast 10 MG tablet  Commonly known as:  SINGULAIR     MULTIPLE VITAMIN PO     * omeprazole 20 MG delayed release capsule  Commonly known as:  PRILOSEC  Take 2 capsules by mouth 2 times daily for 14 days     * omeprazole 40 MG delayed release capsule  Commonly known as:  PRILOSEC  Take 1 capsule by mouth daily     pregabalin 150 MG capsule  Commonly known as:  LYRICA     raloxifene 60 MG tablet  Commonly known as:  EVISTA     simvastatin 20 MG tablet  Commonly known as:  ZOCOR        * This list has 2 medication(s) that are the same as other medications prescribed for you. Read the directions carefully, and ask your doctor or other  care provider to review them with you.               Where to Get Your Medications      You can get these medications from any pharmacy    Bring a paper prescription for each of these medications  ?? amoxicillin-clavulanate 875-125 MG per tablet  ?? docusate sodium 100 MG capsule  ?? fluconazole 100 MG tablet  ?? HYDROcodone-acetaminophen 5-325 MG per tablet  ?? ondansetron 4 MG tablet         Patient Instructions:     Activity: activity as tolerated  Diet: regular diet  Wound Care: shower and leave the incisions open to air     Follow-up with Dr. Elyse JarvisYurich in 2 weeks.    SignedAugusto Gamble:  Amrit Cress D Tawna Alwin  03/01/2016  5:05 AM

## 2016-02-26 NOTE — Progress Notes (Signed)
Call placed to [facility], on the way.;  Electronically signed by Bryna ColanderPamela Mykael Batz, RN on 02/26/2016 at 8:07 PM

## 2016-02-26 NOTE — Care Coordination-Inpatient (Signed)
Social work / Building control surveyorDischarge Planning:       Social work spoke to the liaison from [facility] and confirmed that the patient's precert is still good if patient is discharged later tonight.  The liaison is calling [facility] to make financial arrangements for transport.   Nursing will need to call [facility] to schedule pick up once patient is ready for discharge.   RN updated.  Electronically signed by Clydene PughMelissa E Blasa Raisch, LSW on 02/26/2016 at 1:56 PM

## 2016-02-26 NOTE — Progress Notes (Signed)
[facility] Quality Flow/Interdisciplinary Rounds Progress Note        Quality Flow Rounds held on February 26, 2016    Disciplines Attending:  Bedside Nurse, Social Worker, Case Manager and Nursing Unit Leadership    Gay FillerRuth E Giambalvo was admitted on 02/19/2016  3:55 PM    Anticipated Discharge Date:  Expected Discharge Date: 02/24/16    Disposition:    Braden Score:  Braden Scale Score: 19    Readmission Risk              Readmission Risk:        14.75       Age 80 or Greater:  1    Admitted from SNF or Requires Paid or Family Care:  0    Currently has CHF,COPD,ARF,CRI,or is on dialysis:  4    Takes more than 5 Prescription Medications:  4    Takes Digoxin,Insulin,Anticoagulants,Narcotics or ASA/Plavix:  2    Hospital Admit in Past 12 Months:  0    On Disability:  0    Patient Considers own Health:  3.75          Discussed patient goal for the day, patient clinical progression, and barriers to discharge.  The following Goal(s) of the Day/Commitment(s) have been identified:  Replace potassium, possible discharge today.      Bryna Colanderamela Leonard Hendler  February 26, 2016

## 2016-04-03 ENCOUNTER — Inpatient Hospital Stay
Admission: EM | Admit: 2016-04-03 | Discharge: 2016-04-17 | Disposition: A | Discharge status: Hospice | Payer: MEDICARE | Source: Home / Self Care | Admitting: Internal Medicine

## 2016-04-03 ENCOUNTER — Encounter: Admit: 2016-04-03 | Primary: Family Medicine

## 2016-04-03 ENCOUNTER — Emergency Department: Admit: 2016-04-03 | Primary: Family Medicine

## 2016-04-03 DIAGNOSIS — I472 Ventricular tachycardia, unspecified (HCC): Principal | ICD-10-CM

## 2016-04-03 LAB — CBC WITH AUTO DIFFERENTIAL
Basophils %: 0.3 % (ref 0.0–2.0)
Basophils Absolute: 0.03 E9/L (ref 0.00–0.20)
Eosinophils %: 0.2 % (ref 0.0–6.0)
Eosinophils Absolute: 0.02 E9/L — ABNORMAL LOW (ref 0.05–0.50)
Hematocrit: 36.4 % (ref 34.0–48.0)
Hemoglobin: 11.8 g/dL (ref 11.5–15.5)
Immature Granulocytes #: 0.14 E9/L
Immature Granulocytes %: 1.5 % (ref 0.0–5.0)
Lymphocytes %: 28.8 % (ref 20.0–42.0)
Lymphocytes Absolute: 2.62 E9/L (ref 1.50–4.00)
MCH: 30.3 pg (ref 26.0–35.0)
MCHC: 32.4 % (ref 32.0–34.5)
MCV: 93.3 fL (ref 80.0–99.9)
MPV: 10.8 fL (ref 7.0–12.0)
Monocytes %: 8.3 % (ref 2.0–12.0)
Monocytes Absolute: 0.75 E9/L (ref 0.10–0.95)
Neutrophils %: 60.9 % (ref 43.0–80.0)
Neutrophils Absolute: 5.53 E9/L (ref 1.80–7.30)
Platelets: 159 E9/L (ref 130–450)
RBC: 3.9 E12/L (ref 3.50–5.50)
RDW: 16.3 fL — ABNORMAL HIGH (ref 11.5–15.0)
WBC: 9.1 E9/L (ref 4.5–11.5)

## 2016-04-03 LAB — COMPREHENSIVE METABOLIC PANEL
ALT: 42 U/L — ABNORMAL HIGH (ref 0–32)
AST: 114 U/L — ABNORMAL HIGH (ref 0–31)
Albumin: 2.3 g/dL — ABNORMAL LOW (ref 3.5–5.2)
Alkaline Phosphatase: 215 U/L — ABNORMAL HIGH (ref 35–104)
Anion Gap: 12 mmol/L (ref 7–16)
BUN: 18 mg/dL (ref 8–23)
CO2: 29 mmol/L (ref 22–29)
Calcium: 8.2 mg/dL — ABNORMAL LOW (ref 8.6–10.2)
Chloride: 97 mmol/L — ABNORMAL LOW (ref 98–107)
Creatinine: 0.9 mg/dL (ref 0.5–1.0)
GFR African American: >60
GFR Non-African American: 59 mL/min/{1.73_m2} (ref >=60)
Glucose: 99 mg/dL (ref 74–109)
Potassium: 3.2 mmol/L — ABNORMAL LOW (ref 3.5–5.0)
Sodium: 138 mmol/L (ref 132–146)
Total Bilirubin: 3.5 mg/dL — ABNORMAL HIGH (ref 0.0–1.2)
Total Protein: 5 g/dL — ABNORMAL LOW (ref 6.4–8.3)

## 2016-04-03 LAB — BLOOD GAS, ARTERIAL
B.E.: 6.5 mmol/L — ABNORMAL HIGH (ref <=3.0)
COHb: 1.6 % — ABNORMAL HIGH (ref 0.0–1.5)
Date Analyzed: 20171209
Date Of Collection: 20171209
HCO3: 29.7 mmol/L — ABNORMAL HIGH (ref 22.0–26.0)
HHb: 1.7 % (ref 0.0–5.0)
Lab: 15941
MetHb: 0.1 % (ref 0.0–1.5)
O2 Content: 16.4 mL/dL
O2 Sat: 98.3 % (ref 92.0–98.5)
O2Hb: 96.6 % (ref 94.0–97.0)
Operator ID: 305701
PCO2: 37.5 mmHg (ref 35.0–45.0)
PO2: 101.6 mmHg — ABNORMAL HIGH (ref 60.0–100.0)
Pt Temp: 37 C
Time Analyzed: 1010
Time Collected: 1005
pH, Blood Gas: 7.517 — ABNORMAL HIGH (ref 7.350–7.450)
tHb (est): 12 g/dL (ref 11.5–16.5)

## 2016-04-03 LAB — LACTIC ACID
Lactic Acid: 3.5 mmol/L — ABNORMAL HIGH (ref 0.5–2.2)
Lactic Acid: 2.5 mmol/L — ABNORMAL HIGH (ref 0.5–2.2)

## 2016-04-03 LAB — DIGOXIN LEVEL: Digoxin Lvl: 1.2 ng/mL (ref 0.8–2.0)

## 2016-04-03 LAB — PROTIME-INR
INR: 2
Protime: 22.6 s — ABNORMAL HIGH (ref 9.3–12.4)

## 2016-04-03 LAB — TROPONIN
Troponin: 0.03 ng/mL (ref 0.00–0.03)
Troponin: 0.04 ng/mL — ABNORMAL HIGH (ref 0.00–0.03)

## 2016-04-03 LAB — MAGNESIUM: Magnesium: 2.1 mg/dL (ref 1.6–2.6)

## 2016-04-03 LAB — APTT: aPTT: 40.4 s — ABNORMAL HIGH (ref 24.5–35.1)

## 2016-04-03 MED ORDER — RALOXIFENE HCL 60 MG PO TABS
60 MG | Freq: Every day | ORAL | Status: DC
Start: 2016-04-03 — End: 2016-04-03

## 2016-04-03 MED ORDER — AMIODARONE HCL 150 MG/3ML IV SOLN
150 MG/3ML | Freq: Once | INTRAVENOUS | Status: DC
Start: 2016-04-03 — End: 2016-04-03

## 2016-04-03 MED ORDER — POTASSIUM CHLORIDE 10 MEQ/100ML IV SOLN
10 MEQ/0ML | INTRAVENOUS | Status: AC
Start: 2016-04-03 — End: 2016-04-03
  Administered 2016-04-03 – 2016-04-04 (×4): 10 meq via INTRAVENOUS

## 2016-04-03 MED ORDER — AMIODARONE HCL 150 MG/3ML IV SOLN
150 MG/3ML | INTRAVENOUS | Status: DC
Start: 2016-04-03 — End: 2016-04-03

## 2016-04-03 MED ORDER — PANTOPRAZOLE SODIUM 40 MG IV SOLR
40 MG | Freq: Every day | INTRAVENOUS | Status: DC
Start: 2016-04-03 — End: 2016-04-03

## 2016-04-03 MED ORDER — DIGOXIN 125 MCG PO TABS
125 MCG | ORAL | Status: DC
Start: 2016-04-03 — End: 2016-04-03

## 2016-04-03 MED ORDER — IPRATROPIUM-ALBUTEROL 0.5-2.5 (3) MG/3ML IN SOLN
RESPIRATORY_TRACT | Status: DC | PRN
Start: 2016-04-03 — End: 2016-04-17
  Administered 2016-04-14: 22:00:00 1 via RESPIRATORY_TRACT

## 2016-04-03 MED ORDER — DABIGATRAN ETEXILATE MESYLATE 150 MG PO CAPS
150 MG | Freq: Two times a day (BID) | ORAL | Status: DC
Start: 2016-04-03 — End: 2016-04-03

## 2016-04-03 MED ORDER — METOPROLOL SUCCINATE ER 25 MG PO TB24
25 MG | Freq: Every day | ORAL | Status: DC
Start: 2016-04-03 — End: 2016-04-03

## 2016-04-03 MED ORDER — DONEPEZIL HCL 5 MG PO TABS
5 MG | Freq: Every evening | ORAL | Status: DC
Start: 2016-04-03 — End: 2016-04-03

## 2016-04-03 MED ORDER — SIMVASTATIN 20 MG PO TABS
20 MG | Freq: Every evening | ORAL | Status: DC
Start: 2016-04-03 — End: 2016-04-03

## 2016-04-03 MED ORDER — ACETAMINOPHEN 325 MG PO TABS
325 MG | ORAL | Status: DC | PRN
Start: 2016-04-03 — End: 2016-04-03

## 2016-04-03 MED ORDER — TRIMETHOBENZAMIDE HCL 100 MG/ML IM SOLN
100 MG/ML | Freq: Four times a day (QID) | INTRAMUSCULAR | Status: DC | PRN
Start: 2016-04-03 — End: 2016-04-17

## 2016-04-03 MED ORDER — ACETAMINOPHEN 650 MG RE SUPP
650 MG | RECTAL | Status: DC | PRN
Start: 2016-04-03 — End: 2016-04-17
  Administered 2016-04-11: 10:00:00 650 mg via RECTAL

## 2016-04-03 MED ORDER — HEPARIN SOD (PORCINE) IN D5W 100 UNIT/ML IV SOLN
100 UNIT/ML | INTRAVENOUS | Status: DC
Start: 2016-04-03 — End: 2016-04-06
  Administered 2016-04-03: 23:00:00 12 [IU]/kg/h via INTRAVENOUS

## 2016-04-03 MED ORDER — HEPARIN SODIUM (PORCINE) 1000 UNIT/ML IJ SOLN
1000 UNIT/ML | INTRAMUSCULAR | Status: DC | PRN
Start: 2016-04-03 — End: 2016-04-06

## 2016-04-03 MED ORDER — DEXTROSE 5 % IV SOLN
5 % | Freq: Once | INTRAVENOUS | Status: DC
Start: 2016-04-03 — End: 2016-04-03

## 2016-04-03 MED ORDER — METOPROLOL TARTRATE 5 MG/5ML IV SOLN
5 MG/ML | Freq: Three times a day (TID) | INTRAVENOUS | Status: DC
Start: 2016-04-03 — End: 2016-04-03

## 2016-04-03 MED ORDER — PANTOPRAZOLE SODIUM 40 MG PO TBEC
40 MG | Freq: Every day | ORAL | Status: DC
Start: 2016-04-03 — End: 2016-04-03

## 2016-04-03 MED ORDER — MAGNESIUM SULFATE 2000 MG/50 ML IVPB PREMIX
2 GM/50ML | Freq: Once | INTRAVENOUS | Status: AC
Start: 2016-04-03 — End: 2016-04-03
  Administered 2016-04-03: 23:00:00 2 g via INTRAVENOUS

## 2016-04-03 MED ORDER — BUPROPION HCL ER (SR) 150 MG PO TB12
150 MG | Freq: Every evening | ORAL | Status: DC
Start: 2016-04-03 — End: 2016-04-03

## 2016-04-03 MED ORDER — PREGABALIN 75 MG PO CAPS
75 MG | Freq: Two times a day (BID) | ORAL | Status: DC
Start: 2016-04-03 — End: 2016-04-03

## 2016-04-03 MED ORDER — NORMAL SALINE FLUSH 0.9 % IV SOLN
0.9 % | Freq: Two times a day (BID) | INTRAVENOUS | Status: DC
Start: 2016-04-03 — End: 2016-04-17
  Administered 2016-04-04 – 2016-04-17 (×20): 10 mL via INTRAVENOUS

## 2016-04-03 MED ORDER — SODIUM CHLORIDE 0.9 % IV SOLN
0.9 % | INTRAVENOUS | Status: DC
Start: 2016-04-03 — End: 2016-04-04
  Administered 2016-04-03 – 2016-04-04 (×2): via INTRAVENOUS

## 2016-04-03 MED ORDER — LEVOTHYROXINE SODIUM 50 MCG PO TABS
50 MCG | Freq: Every day | ORAL | Status: DC
Start: 2016-04-03 — End: 2016-04-03

## 2016-04-03 MED ORDER — POTASSIUM CHLORIDE CRYS ER 20 MEQ PO TBCR
20 MEQ | Freq: Once | ORAL | Status: DC
Start: 2016-04-03 — End: 2016-04-03

## 2016-04-03 MED ORDER — NORMAL SALINE FLUSH 0.9 % IV SOLN
0.9 % | INTRAVENOUS | Status: DC | PRN
Start: 2016-04-03 — End: 2016-04-17
  Administered 2016-04-11 – 2016-04-15 (×2): 10 mL via INTRAVENOUS

## 2016-04-03 MED ORDER — DEXTROSE 5 % IV SOLN
5 % | INTRAVENOUS | Status: DC
Start: 2016-04-03 — End: 2016-04-05
  Administered 2016-04-03 – 2016-04-05 (×3): 1 ug/min via INTRAVENOUS

## 2016-04-03 MED ORDER — METOPROLOL TARTRATE 5 MG/5ML IV SOLN
5 MG/ML | Freq: Once | INTRAVENOUS | Status: DC
Start: 2016-04-03 — End: 2016-04-03

## 2016-04-03 MED ORDER — SODIUM CHLORIDE 0.9 % IV BOLUS
0.9 % | Freq: Once | INTRAVENOUS | Status: AC
Start: 2016-04-03 — End: 2016-04-03
  Administered 2016-04-03: 16:00:00 500 mL via INTRAVENOUS

## 2016-04-03 MED ORDER — MONTELUKAST SODIUM 10 MG PO TABS
10 MG | Freq: Every evening | ORAL | Status: DC
Start: 2016-04-03 — End: 2016-04-03

## 2016-04-03 MED FILL — METOPROLOL SUCCINATE ER 25 MG PO TB24: 25 MG | ORAL | Qty: 1

## 2016-04-03 MED FILL — ISUPREL 0.2 MG/ML IJ SOLN: 0.2 MG/ML | INTRAMUSCULAR | Qty: 5

## 2016-04-03 MED FILL — SINGULAIR 10 MG PO TABS: 10 MG | ORAL | Qty: 1

## 2016-04-03 MED FILL — POTASSIUM CHLORIDE 10 MEQ/100ML IV SOLN: 10 MEQ/0ML | INTRAVENOUS | Qty: 100

## 2016-04-03 MED FILL — HEPARIN SOD (PORCINE) IN D5W 100 UNIT/ML IV SOLN: 100 UNIT/ML | INTRAVENOUS | Qty: 250

## 2016-04-03 MED FILL — IPRATROPIUM-ALBUTEROL 0.5-2.5 (3) MG/3ML IN SOLN: RESPIRATORY_TRACT | Qty: 3

## 2016-04-03 MED FILL — SIMVASTATIN 20 MG PO TABS: 20 MG | ORAL | Qty: 1

## 2016-04-03 MED FILL — PRADAXA 150 MG PO CAPS: 150 MG | ORAL | Qty: 1

## 2016-04-03 MED FILL — AMIODARONE HCL 150 MG/3ML IV SOLN: 150 MG/3ML | INTRAVENOUS | Qty: 9

## 2016-04-03 MED FILL — PROTONIX 40 MG IV SOLR: 40 MG | INTRAVENOUS | Qty: 40

## 2016-04-03 MED FILL — AMIODARONE HCL 150 MG/3ML IV SOLN: 150 MG/3ML | INTRAVENOUS | Qty: 3

## 2016-04-03 MED FILL — MAGNESIUM SULFATE 2 GM/50ML IV SOLN: 2 GM/50ML | INTRAVENOUS | Qty: 50

## 2016-04-03 MED FILL — METOPROLOL TARTRATE 5 MG/5ML IV SOLN: 5 MG/ML | INTRAVENOUS | Qty: 5

## 2016-04-03 MED FILL — BUPROPION HCL ER (SR) 150 MG PO TB12: 150 MG | ORAL | Qty: 1

## 2016-04-03 MED FILL — DONEPEZIL HCL 5 MG PO TABS: 5 MG | ORAL | Qty: 2

## 2016-04-03 MED FILL — NORMAL SALINE FLUSH 0.9 % IV SOLN: 0.9 % | INTRAVENOUS | Qty: 10

## 2016-04-03 MED FILL — POTASSIUM CHLORIDE CRYS ER 20 MEQ PO TBCR: 20 MEQ | ORAL | Qty: 2

## 2016-04-03 NOTE — Code Documentation (Signed)
Code blue was called at 4:59pm.    On arrival, pt was pulseless for approx 2 mins, she was shocked x1 then had ROSC and was completely alert and responsive by the time code team arrived.    ACLS protocol was initiated and the following was given:  Adrenaline: 0  Atropine:  0  Calcium gluconate: 0  Sodium bicarbonate: 0    We continued CPR for approx 2 mins and pt was revived with plan to transfer to CVICU.    Intensivist was updated.    Dr. Timothy Lassousso discussed case with cardiology who would like patient placed on amiodarone gtt, will give 150 mg bolus x1 as well.     Michelle EpsteinStephen Mayetta [name] , MD PGY3  Internal Medicine resident

## 2016-04-03 NOTE — Plan of Care (Signed)
Problem: Falls - Risk of  Goal: Absence of falls  Outcome: Ongoing      Problem: Risk for Impaired Skin Integrity  Goal: Tissue integrity - skin and mucous membranes  Structural intactness and normal physiological function of skin and  mucous membranes.   Outcome: Ongoing

## 2016-04-03 NOTE — ED Notes (Signed)
FAMILY AT BEDSIDE, UPDATED ON PLAN OF CARE.      Vela ProseHaleigh E Adraine Biffle, RN  04/03/16 1018

## 2016-04-03 NOTE — Progress Notes (Signed)
Left a message to update Dr. Fredric MareBailey on patient. Patient is transferring to [id number].  Patient's daughter [name] updated and notified of transfer to [id number].    Audley HoseAmanda L Weitzman

## 2016-04-03 NOTE — ED Notes (Signed)
Patient unable to swallow from spoon. ED physician notified.      Orson GearJennifer Boe Deans, RN  04/03/16 1249

## 2016-04-03 NOTE — Progress Notes (Signed)
Dr. Myrtis SerKatz updated on patient's care. Also Marchelle FolksAmanda who works with Dr. Fonnie BirkenheadKassis also updated on patient.     Audley HoseAmanda L Weitzman

## 2016-04-03 NOTE — ED Provider Notes (Signed)
HPI:   [name] is a 82 y.o. female presenting to the ED for AMS, beginning this morning.  The complaint has been constant, moderate in severity, and worsened by nothing.  EMS reports they were called to the residence for AMS and found the pt lethargic. They had the pt on the monitor while they were checking BGL, while evaluating pt the monitor showed V. Tach. Patient was shocked per EMS followed by returning back into rhythm and pulses returned. The patient is alert but generally confused arrival. She was found in bed confused per family. Patient denies fever/chills, cough, congestion, chest pain, shortness of breath, edema, headache, abdominal pain, nausea, vomiting, diarrhea, constipation, dysuria, hematuria, trauma, neck or back pain or other complaints.     UPDATE: Patient's family doesn't report the patient is at her typical baseline mental status.    ROS:   Pertinent positives and negatives are stated within HPI, all other systems reviewed and are negative.    --------------------------------------------- PAST HISTORY ---------------------------------------------  Past Medical History:  has a past medical history of A-fib (HCC); Anticoagulated; Asthma; COPD (chronic obstructive pulmonary disease) (HCC); Depression; Forgetfulness; GERD (gastroesophageal reflux disease); HOH (hard of hearing); Hyperlipidemia; Pain; Restless leg syndrome; and Thyroid disease.    Past Surgical History:  has a past surgical history that includes joint replacement (Bilateral, 2011?); Varicose vein surgery (Bilateral, 2012); back surgery (2010?); Breast surgery; Hysterectomy (1970's); Endoscopy, colon, diagnostic (02/28/2015); and Abdomen surgery (02/19/2016).    Social History:  reports that she has never smoked. She has never used smokeless tobacco. She reports that she does not drink alcohol or use drugs.    Family History: family history is not on file.     The patient???s home medications have been reviewed.    Allergies:  Aspirin; Chicken allergy; Eggs or egg-derived products; and Sulfa antibiotics    -------------------------------------------------- RESULTS -------------------------------------------------  All laboratory and radiology results have been personally reviewed by myself   LABS:  Results for orders placed or performed during the hospital encounter of 04/03/16   Culture Blood #1   Result Value Ref Range    Blood Culture, Routine 5 Days- no growth    Culture Blood #2   Result Value Ref Range    Culture, Blood 2 5 Days- no growth    Culture Blood #1   Result Value Ref Range    Blood Culture, Routine 24 Hours- no growth    Urine Culture   Result Value Ref Range    Urine Culture, Routine <10,000 CFU/mL  Gram positive organism   (A)     Organism Pseudomonas aeruginosa (A)     Urine Culture, Routine >100,000 CFU/ml        Susceptibility    Pseudomonas aeruginosa - BACTERIAL SUSCEPTIBILITY PANEL BY MIC     cefepime =^8 Sensitive mcg/mL     gentamicin =^2 Sensitive mcg/mL     imipenem =^1 Sensitive mcg/mL     levofloxacin =^1 Sensitive mcg/mL     piperacillin-tazobactam =^8 Sensitive mcg/mL     tobramycin <=^1 Sensitive mcg/mL   Culture Stool   Result Value Ref Range    Culture, Stool       Salmonella, Shigella, Campylobacter, Yersinia,  Aeromonas or E. Coli 0157:H7 not isolated     Clostridium difficile EIA   Result Value Ref Range    C difficile Toxin, EIA       Result: C Difficile Toxins A and B not detected  *  *  Normal Range:  Not detected     Strep Pneumoniae Antigen   Result Value Ref Range    STREP PNEUMONIAE ANTIGEN, URINE       Presumptive NEGATIVE for Pneumococcal pneumonia, suggesting  no current or recent pneumococcal infection. Infection due  to S. pneumoniae cannot be ruled out since the antigen present  in the sample may be below the detection limit of the test.     Legionella antigen, urine   Result Value Ref Range    L. pneumophila Serogp 1 Ur Ag       Presumptive NEGATIVE suggesting no recent or current  infections  with Legionella pneumophilia serogroup 1. Infection to  Legionella cannot be ruled out since other serogroups and  species may cause infection, antigen may not be present in  early infection, or level of antigen may be below the  detection limit.     Respiratory Panel, Film Array   Result Value Ref Range    Film Array Adenovirus       Result: Not Detected  *  *  Normal Range: Not Detected      Film Array Coronavirus HKU1       Result: Not Detected  *  *  Normal Range: Not Detected      Film Array Conoravirus NL63       Result: Not Detected  *  *  Normal Range: Not Detected      Film Array Coronavirus 229E       Result: Not Detected  *  *  Normal Range: Not Detected      Film Array Coronavirus OC43       Result: Not Detected  *  *  Normal Range: Not Detected      Film Array Influenza A Virus       Result: Not Detected  *  *  Normal Range: Not Detected      Film Array Influenza A Virus H1       Result: Not Detected  *  *  Normal Range: Not Detected      Film Array Influenza A Virus 09H1       Result: Not Detected  *  *  Normal Range: Not Detected      Film Array Influenza A Virus H3       Result: Not Detected  *  *  Normal Range: Not Detected      Film Array Influenza B       Result: Not Detected  *  *  Normal Range: Not Detected      Film Array Metapneumovirus       Result: Not Detected  *  *  Normal Range: Not Detected      Film Array Parainfluenza Virus 1       Result: Not Detected  *  *  Normal Range: Not Detected      Film Array Parainfluenza Virus 2       Result: Not Detected  *  *  Normal Range: Not Detected      Film Array Parainfluenza Virus 3       Result: Not Detected  *  *  Normal Range: Not Detected      Film Array Parainfluenza Virus 4       Result: Not Detected  *  *  Normal Range: Not Detected      Film Array Respiratory Syncitial Virus       Result: Not Detected  *  *  Normal Range:  Not Detected      Film Array Rhinovirus/Enterovirus       Result: Not Detected  *  *  Normal Range: Not  Detected      Film Array Bordetella Pertusis       Result: Not Detected  *  *  Normal Range: Not Detected      Film Array Chlamydophilia Pneumoniae       Result: Not Detected  *  *  Normal Range: Not Detected      Film Array Mycoplasma Pneumoniae       Result: Not Detected  *  *  Normal Range: Not Detected     CBC Auto Differential   Result Value Ref Range    WBC 9.1 4.5 - 11.5 E9/L    RBC 3.90 3.50 - 5.50 E12/L    Hemoglobin 11.8 11.5 - 15.5 g/dL    Hematocrit 16.1 09.6 - 48.0 %    MCV 93.3 80.0 - 99.9 fL    MCH 30.3 26.0 - 35.0 pg    MCHC 32.4 32.0 - 34.5 %    RDW 16.3 (H) 11.5 - 15.0 fL    Platelets 159 130 - 450 E9/L    MPV 10.8 7.0 - 12.0 fL    Neutrophils % 60.9 43.0 - 80.0 %    Immature Granulocytes % 1.5 0.0 - 5.0 %    Lymphocytes % 28.8 20.0 - 42.0 %    Monocytes % 8.3 2.0 - 12.0 %    Eosinophils % 0.2 0.0 - 6.0 %    Basophils % 0.3 0.0 - 2.0 %    Neutrophils # 5.53 1.80 - 7.30 E9/L    Immature Granulocytes # 0.14 E9/L    Lymphocytes # 2.62 1.50 - 4.00 E9/L    Monocytes # 0.75 0.10 - 0.95 E9/L    Eosinophils # 0.02 (L) 0.05 - 0.50 E9/L    Basophils # 0.03 0.00 - 0.20 E9/L   Lactic Acid, Plasma   Result Value Ref Range    Lactic Acid 3.5 (H) 0.5 - 2.2 mmol/L   Lactic Acid, Plasma   Result Value Ref Range    Lactic Acid 2.5 (H) 0.5 - 2.2 mmol/L   Protime-INR   Result Value Ref Range    Protime 22.6 (H) 9.3 - 12.4 sec    INR 2.0    APTT   Result Value Ref Range    aPTT 40.4 (H) 24.5 - 35.1 sec   Magnesium   Result Value Ref Range    Magnesium 2.1 1.6 - 2.6 mg/dL   Comprehensive metabolic panel   Result Value Ref Range    Sodium 138 132 - 146 mmol/L    Potassium 3.2 (L) 3.5 - 5.0 mmol/L    Chloride 97 (L) 98 - 107 mmol/L    CO2 29 22 - 29 mmol/L    Anion Gap 12 7 - 16 mmol/L    Glucose 99 74 - 109 mg/dL    BUN 18 8 - 23 mg/dL    CREATININE 0.9 0.5 - 1.0 mg/dL    GFR Non-African American 59 >=60 mL/min/1.73    GFR African American >60     Calcium 8.2 (L) 8.6 - 10.2 mg/dL    Total Protein 5.0 (L) 6.4 - 8.3 g/dL     Alb 2.3 (L) 3.5 - 5.2 g/dL    Total Bilirubin 3.5 (H) 0.0 - 1.2 mg/dL    Alkaline Phosphatase 215 (H) 35 - 104 U/L  ALT 42 (H) 0 - 32 U/L    AST 114 (H) 0 - 31 U/L   Digoxin level   Result Value Ref Range    Digoxin Lvl 1.2 0.8 - 2.0 ng/mL   Troponin   Result Value Ref Range    Troponin 0.03 0.00 - 0.03 ng/mL   Blood Gas, Arterial   Result Value Ref Range    Date Analyzed 16109604     Time Analyzed 1010     Source: Blood Arterial     pH, Blood Gas 7.517 (H) 7.350 - 7.450    PCO2 37.5 35.0 - 45.0 mmHg    PO2 101.6 (H) 60.0 - 100.0 mmHg    HCO3 29.7 (H) 22.0 - 26.0 mmol/L    B.E. 6.5 (H) -3.0 - 3.0 mmol/L    O2 Sat 98.3 92.0 - 98.5 %    O2Hb 96.6 94.0 - 97.0 %    COHb 1.6 (H) 0.0 - 1.5 %    MetHb 0.1 0.0 - 1.5 %    O2 Content 16.4 mL/dL    HHb 1.7 0.0 - 5.0 %    tHb (est) 12.0 11.5 - 16.5 g/dL    Mode NC-  3L     Date Of Collection [id number]     Time Collected 1005     Pt Temp 37.0 C    Operator ID [id number]     Lab [id number]     Critical(s) Notified . No Critical Values    Troponin   Result Value Ref Range    Troponin 0.04 (H) 0.00 - 0.03 ng/mL   TSH without Reflex   Result Value Ref Range    TSH 2.900 0.270 - 4.200 uIU/mL   Basic Metabolic Panel   Result Value Ref Range    Sodium 139 132 - 146 mmol/L    Potassium 6.0 (H) 3.5 - 5.0 mmol/L    Chloride 101 98 - 107 mmol/L    CO2 26 22 - 29 mmol/L    Anion Gap 12 7 - 16 mmol/L    Glucose 73 (L) 74 - 109 mg/dL    BUN 17 8 - 23 mg/dL    CREATININE 0.8 0.5 - 1.0 mg/dL    GFR Non-African American >60 >=60 mL/min/1.73    GFR African American >60     Calcium 7.3 (L) 8.6 - 10.2 mg/dL   CBC   Result Value Ref Range    WBC 11.6 (H) 4.5 - 11.5 E9/L    RBC 3.87 3.50 - 5.50 E12/L    Hemoglobin 11.4 (L) 11.5 - 15.5 g/dL    Hematocrit 08.6 57.8 - 48.0 %    MCV 92.0 80.0 - 99.9 fL    MCH 29.5 26.0 - 35.0 pg    MCHC 32.0 32.0 - 34.5 %    RDW 16.4 (H) 11.5 - 15.0 fL    Platelets 156 130 - 450 E9/L    MPV 10.8 7.0 - 12.0 fL   Magnesium   Result Value Ref Range    Magnesium 2.4 1.6 - 2.6  mg/dL   Lactic acid, plasma   Result Value Ref Range    Lactic Acid 2.6 (H) 0.5 - 2.2 mmol/L   APTT   Result Value Ref Range    aPTT 174.9 (H) 24.5 - 35.1 sec   APTT   Result Value Ref Range    aPTT >240.0 (H) 24.5 - 35.1 sec   Basic metabolic panel   Result Value Ref Range  Sodium 143 132 - 146 mmol/L    Potassium 3.3 (L) 3.5 - 5.0 mmol/L    Chloride 101 98 - 107 mmol/L    CO2 29 22 - 29 mmol/L    Anion Gap 13 7 - 16 mmol/L    Glucose 80 74 - 109 mg/dL    BUN 18 8 - 23 mg/dL    CREATININE 0.9 0.5 - 1.0 mg/dL    GFR Non-African American 59 >=60 mL/min/1.73    GFR African American >60     Calcium 7.8 (L) 8.6 - 10.2 mg/dL   APTT   Result Value Ref Range    aPTT 84.0 (H) 24.5 - 35.1 sec   Potassium   Result Value Ref Range    Potassium 4.9 3.5 - 5.0 mmol/L   CBC Auto Differential   Result Value Ref Range    Platelets 134 130 - 450 E9/L    WBC 9.7 4.5 - 11.5 E9/L    RBC 3.68 3.50 - 5.50 E12/L    Hemoglobin 11.0 (L) 11.5 - 15.5 g/dL    Hematocrit 91.4 (L) 34.0 - 48.0 %    MCV 91.6 80.0 - 99.9 fL    MCH 29.9 26.0 - 35.0 pg    MCHC 32.6 32.0 - 34.5 %    RDW 16.6 (H) 11.5 - 15.0 fL    MPV 10.1 7.0 - 12.0 fL    Neutrophils % 63.9 43.0 - 80.0 %    Immature Granulocytes % 1.1 0.0 - 5.0 %    Lymphocytes % 22.4 20.0 - 42.0 %    Monocytes % 11.9 2.0 - 12.0 %    Eosinophils % 0.5 0.0 - 6.0 %    Basophils % 0.2 0.0 - 2.0 %    Neutrophils # 6.19 1.80 - 7.30 E9/L    Immature Granulocytes # 0.11 E9/L    Lymphocytes # 2.17 1.50 - 4.00 E9/L    Monocytes # 1.15 (H) 0.10 - 0.95 E9/L    Eosinophils # 0.05 0.05 - 0.50 E9/L    Basophils # 0.02 0.00 - 0.20 E9/L   Comprehensive Metabolic Panel   Result Value Ref Range    Sodium 141 132 - 146 mmol/L    Potassium 4.1 3.5 - 5.0 mmol/L    Chloride 103 98 - 107 mmol/L    CO2 24 22 - 29 mmol/L    Anion Gap 14 7 - 16 mmol/L    Glucose 144 (H) 74 - 109 mg/dL    BUN 16 8 - 23 mg/dL    CREATININE 0.7 0.5 - 1.0 mg/dL    GFR Non-African American >60 >=60 mL/min/1.73    GFR African American >60      Calcium 7.6 (L) 8.6 - 10.2 mg/dL    Total Protein 4.8 (L) 6.4 - 8.3 g/dL    Alb 2.3 (L) 3.5 - 5.2 g/dL    Total Bilirubin 2.8 (H) 0.0 - 1.2 mg/dL    Alkaline Phosphatase 206 (H) 35 - 104 U/L    ALT 41 (H) 0 - 32 U/L    AST 104 (H) 0 - 31 U/L   Magnesium   Result Value Ref Range    Magnesium 2.3 1.6 - 2.6 mg/dL   APTT   Result Value Ref Range    aPTT 57.8 (H) 24.5 - 35.1 sec   CBC Auto Differential   Result Value Ref Range    WBC 11.5 4.5 - 11.5 E9/L    RBC 4.03 3.50 - 5.50  E12/L    Hemoglobin 12.1 11.5 - 15.5 g/dL    Hematocrit 54.0 98.1 - 48.0 %    MCV 92.3 80.0 - 99.9 fL    MCH 30.0 26.0 - 35.0 pg    MCHC 32.5 32.0 - 34.5 %    RDW 16.4 (H) 11.5 - 15.0 fL    Platelets 123 (L) 130 - 450 E9/L    MPV 10.9 7.0 - 12.0 fL    Neutrophils % 68.9 43.0 - 80.0 %    Immature Granulocytes % 1.1 0.0 - 5.0 %    Lymphocytes % 18.0 (L) 20.0 - 42.0 %    Monocytes % 11.3 2.0 - 12.0 %    Eosinophils % 0.5 0.0 - 6.0 %    Basophils % 0.2 0.0 - 2.0 %    Neutrophils # 7.92 (H) 1.80 - 7.30 E9/L    Immature Granulocytes # 0.13 E9/L    Lymphocytes # 2.07 1.50 - 4.00 E9/L    Monocytes # 1.30 (H) 0.10 - 0.95 E9/L    Eosinophils # 0.06 0.05 - 0.50 E9/L    Basophils # 0.02 0.00 - 0.20 E9/L   Comprehensive Metabolic Panel   Result Value Ref Range    Sodium 136 132 - 146 mmol/L    Potassium 3.9 3.5 - 5.0 mmol/L    Chloride 101 98 - 107 mmol/L    CO2 26 22 - 29 mmol/L    Anion Gap 9 7 - 16 mmol/L    Glucose 94 74 - 109 mg/dL    BUN 11 8 - 23 mg/dL    CREATININE 0.5 0.5 - 1.0 mg/dL    GFR Non-African American >60 >=60 mL/min/1.73    GFR African American >60     Calcium 7.7 (L) 8.6 - 10.2 mg/dL    Total Protein 4.8 (L) 6.4 - 8.3 g/dL    Alb 2.1 (L) 3.5 - 5.2 g/dL    Total Bilirubin 2.8 (H) 0.0 - 1.2 mg/dL    Alkaline Phosphatase 227 (H) 35 - 104 U/L    ALT 44 (H) 0 - 32 U/L    AST 102 (H) 0 - 31 U/L   Magnesium   Result Value Ref Range    Magnesium 2.1 1.6 - 2.6 mg/dL   Magnesium   Result Value Ref Range    Magnesium 1.9 1.6 - 2.6 mg/dL   Basic  metabolic panel   Result Value Ref Range    Sodium 135 132 - 146 mmol/L    Potassium 5.1 (H) 3.5 - 5.0 mmol/L    Chloride 103 98 - 107 mmol/L    CO2 25 22 - 29 mmol/L    Anion Gap 7 7 - 16 mmol/L    Glucose 409 (H) 74 - 109 mg/dL    BUN 13 8 - 23 mg/dL    CREATININE 0.5 0.5 - 1.0 mg/dL    GFR Non-African American >60 >=60 mL/min/1.73    GFR African American >60     Calcium 7.4 (L) 8.6 - 10.2 mg/dL   APTT   Result Value Ref Range    aPTT 55.8 (H) 24.5 - 35.1 sec   CBC Auto Differential   Result Value Ref Range    Platelets 130 130 - 450 E9/L    WBC 9.2 4.5 - 11.5 E9/L    RBC 4.27 3.50 - 5.50 E12/L    Hemoglobin 12.9 11.5 - 15.5 g/dL    Hematocrit 19.1 47.8 - 48.0 %    MCV  93.7 80.0 - 99.9 fL    MCH 30.2 26.0 - 35.0 pg    MCHC 32.3 32.0 - 34.5 %    RDW 16.9 (H) 11.5 - 15.0 fL    MPV 11.6 7.0 - 12.0 fL    Neutrophils % 71.9 43.0 - 80.0 %    Immature Granulocytes % 0.5 0.0 - 5.0 %    Lymphocytes % 16.0 (L) 20.0 - 42.0 %    Monocytes % 11.1 2.0 - 12.0 %    Eosinophils % 0.4 0.0 - 6.0 %    Basophils % 0.1 0.0 - 2.0 %    Neutrophils # 6.57 1.80 - 7.30 E9/L    Immature Granulocytes # 0.05 E9/L    Lymphocytes # 1.46 (L) 1.50 - 4.00 E9/L    Monocytes # 1.02 (H) 0.10 - 0.95 E9/L    Eosinophils # 0.04 (L) 0.05 - 0.50 E9/L    Basophils # 0.01 0.00 - 0.20 E9/L   Basic Metabolic Panel   Result Value Ref Range    Sodium 138 132 - 146 mmol/L    Potassium 4.0 3.5 - 5.0 mmol/L    Chloride 103 98 - 107 mmol/L    CO2 24 22 - 29 mmol/L    Anion Gap 11 7 - 16 mmol/L    Glucose 102 74 - 109 mg/dL    BUN 9 8 - 23 mg/dL    CREATININE 0.5 0.5 - 1.0 mg/dL    GFR Non-African American >60 >=60 mL/min/1.73    GFR African American >60     Calcium 7.7 (L) 8.6 - 10.2 mg/dL   CBC Auto Differential   Result Value Ref Range    WBC 8.5 4.5 - 11.5 E9/L    RBC 4.12 3.50 - 5.50 E12/L    Hemoglobin 12.2 11.5 - 15.5 g/dL    Hematocrit 47.8 29.5 - 48.0 %    MCV 92.2 80.0 - 99.9 fL    MCH 29.6 26.0 - 35.0 pg    MCHC 32.1 32.0 - 34.5 %    RDW 17.4 (H) 11.5 -  15.0 fL    Platelets 119 (L) 130 - 450 E9/L    MPV 11.1 7.0 - 12.0 fL    Neutrophils % 58.9 43.0 - 80.0 %    Immature Granulocytes % 0.7 0.0 - 5.0 %    Lymphocytes % 24.6 20.0 - 42.0 %    Monocytes % 14.3 (H) 2.0 - 12.0 %    Eosinophils % 1.3 0.0 - 6.0 %    Basophils % 0.2 0.0 - 2.0 %    Neutrophils # 5.01 1.80 - 7.30 E9/L    Immature Granulocytes # 0.06 E9/L    Lymphocytes # 2.09 1.50 - 4.00 E9/L    Monocytes # 1.22 (H) 0.10 - 0.95 E9/L    Eosinophils # 0.11 0.05 - 0.50 E9/L    Basophils # 0.02 0.00 - 0.20 E9/L   Basic Metabolic Panel   Result Value Ref Range    Sodium 135 132 - 146 mmol/L    Potassium 7.0 (HH) 3.5 - 5.0 mmol/L    Chloride 101 98 - 107 mmol/L    CO2 21 (L) 22 - 29 mmol/L    Anion Gap 13 7 - 16 mmol/L    Glucose 104 74 - 109 mg/dL    BUN 8 8 - 23 mg/dL    CREATININE 0.5 0.5 - 1.0 mg/dL    GFR Non-African American >60 >=60 mL/min/1.73    GFR  African American >60     Calcium 7.5 (L) 8.6 - 10.2 mg/dL   Magnesium   Result Value Ref Range    Magnesium 2.1 1.6 - 2.6 mg/dL   Basic metabolic panel   Result Value Ref Range    Sodium 135 132 - 146 mmol/L    Potassium 4.5 3.5 - 5.0 mmol/L    Chloride 102 98 - 107 mmol/L    CO2 24 22 - 29 mmol/L    Anion Gap 9 7 - 16 mmol/L    Glucose 84 74 - 109 mg/dL    BUN 8 8 - 23 mg/dL    CREATININE 0.6 0.5 - 1.0 mg/dL    GFR Non-African American >60 >=60 mL/min/1.73    GFR African American >60     Calcium 8.1 (L) 8.6 - 10.2 mg/dL   CBC Auto Differential   Result Value Ref Range    WBC 8.7 4.5 - 11.5 E9/L    RBC 4.27 3.50 - 5.50 E12/L    Hemoglobin 12.6 11.5 - 15.5 g/dL    Hematocrit 40.9 81.1 - 48.0 %    MCV 92.0 80.0 - 99.9 fL    MCH 29.5 26.0 - 35.0 pg    MCHC 32.1 32.0 - 34.5 %    RDW 17.1 (H) 11.5 - 15.0 fL    Platelets 125 (L) 130 - 450 E9/L    MPV 11.0 7.0 - 12.0 fL    Neutrophils % 57.0 43.0 - 80.0 %    Immature Granulocytes % 0.6 0.0 - 5.0 %    Lymphocytes % 25.8 20.0 - 42.0 %    Monocytes % 13.5 (H) 2.0 - 12.0 %    Eosinophils % 2.8 0.0 - 6.0 %    Basophils % 0.3  0.0 - 2.0 %    Neutrophils # 4.97 1.80 - 7.30 E9/L    Immature Granulocytes # 0.05 E9/L    Lymphocytes # 2.25 1.50 - 4.00 E9/L    Monocytes # 1.18 (H) 0.10 - 0.95 E9/L    Eosinophils # 0.24 0.05 - 0.50 E9/L    Basophils # 0.03 0.00 - 0.20 E9/L   Basic Metabolic Panel   Result Value Ref Range    Sodium 137 132 - 146 mmol/L    Potassium 4.3 3.5 - 5.0 mmol/L    Chloride 102 98 - 107 mmol/L    CO2 21 (L) 22 - 29 mmol/L    Anion Gap 14 7 - 16 mmol/L    Glucose 78 74 - 109 mg/dL    BUN 8 8 - 23 mg/dL    CREATININE 0.6 0.5 - 1.0 mg/dL    GFR Non-African American >60 >=60 mL/min/1.73    GFR African American >60     Calcium 7.7 (L) 8.6 - 10.2 mg/dL   Magnesium   Result Value Ref Range    Magnesium 1.9 1.6 - 2.6 mg/dL   CBC Auto Differential   Result Value Ref Range    WBC 8.1 4.5 - 11.5 E9/L    RBC 4.11 3.50 - 5.50 E12/L    Hemoglobin 12.4 11.5 - 15.5 g/dL    Hematocrit 91.4 78.2 - 48.0 %    MCV 90.0 80.0 - 99.9 fL    MCH 30.2 26.0 - 35.0 pg    MCHC 33.5 32.0 - 34.5 %    RDW 17.3 (H) 11.5 - 15.0 fL    Platelets 140 130 - 450 E9/L    MPV 10.3 7.0 - 12.0 fL  Neutrophils % 79.7 43.0 - 80.0 %    Immature Granulocytes % 0.6 0.0 - 5.0 %    Lymphocytes % 11.4 (L) 20.0 - 42.0 %    Monocytes % 8.1 2.0 - 12.0 %    Eosinophils % 0.0 0.0 - 6.0 %    Basophils % 0.2 0.0 - 2.0 %    Neutrophils # 6.45 1.80 - 7.30 E9/L    Immature Granulocytes # 0.05 E9/L    Lymphocytes # 0.92 (L) 1.50 - 4.00 E9/L    Monocytes # 0.66 0.10 - 0.95 E9/L    Eosinophils # 0.00 (L) 0.05 - 0.50 E9/L    Basophils # 0.02 0.00 - 0.20 E9/L   Comprehensive Metabolic Panel   Result Value Ref Range    Sodium 132 132 - 146 mmol/L    Potassium 4.5 3.5 - 5.0 mmol/L    Chloride 100 98 - 107 mmol/L    CO2 21 (L) 22 - 29 mmol/L    Anion Gap 11 7 - 16 mmol/L    Glucose 105 74 - 109 mg/dL    BUN 4 (L) 8 - 23 mg/dL    CREATININE 0.6 0.5 - 1.0 mg/dL    GFR Non-African American >60 >=60 mL/min/1.73    GFR African American >60     Calcium 8.1 (L) 8.6 - 10.2 mg/dL    Total Protein  5.0 (L) 6.4 - 8.3 g/dL    Alb 2.0 (L) 3.5 - 5.2 g/dL    Total Bilirubin 2.8 (H) 0.0 - 1.2 mg/dL    Alkaline Phosphatase 252 (H) 35 - 104 U/L    ALT 36 (H) 0 - 32 U/L    AST 66 (H) 0 - 31 U/L   Magnesium   Result Value Ref Range    Magnesium 1.6 1.6 - 2.6 mg/dL   Lactic Acid, Plasma   Result Value Ref Range    Lactic Acid 3.4 (H) 0.5 - 2.2 mmol/L   CK   Result Value Ref Range    Total CK 41 20 - 180 U/L   CK MB   Result Value Ref Range    CK-MB 2.6 0.0 - 4.3 ng/mL   Blood Gas, Arterial   Result Value Ref Range    Date Analyzed [id number]     Time Analyzed 0541     Source: Blood Arterial     pH, Blood Gas 7.529 (H) 7.350 - 7.450    PCO2 22.4 (L) 35.0 - 45.0 mmHg    PO2 73.1 60.0 - 100.0 mmHg    HCO3 18.3 (L) 22.0 - 26.0 mmol/L    B.E. -2.6 -3.0 - 3.0 mmol/L    O2 Sat 95.7 92.0 - 98.5 %    O2Hb 94.5 94.0 - 97.0 %    COHb 1.0 0.0 - 1.5 %    MetHb 0.3 0.0 - 1.5 %    O2 Content 17.4 mL/dL    HHb 4.2 0.0 - 5.0 %    tHb (est) 13.1 11.5 - 16.5 g/dL    Potassium 5.404.16 9.813.30 - 5.10 mmol/L    Mode NC2-  L     Date Of Collection [id number]     Time Collected 0539     Pt Temp 37.0 C    Operator ID [id number]     Lab [id number]     Critical(s) Notified . No Critical Values    Phosphorus   Result Value Ref Range    Phosphorus 1.8 (L) 2.5 - 4.5 mg/dL  Procalcitonin   Result Value Ref Range    Procalcitonin 0.26 (H) 0.00 - 0.08 ng/mL   LACTIC ACID, PLASMA   Result Value Ref Range    Lactic Acid 4.7 (HH) 0.5 - 2.2 mmol/L   LACTIC ACID, PLASMA   Result Value Ref Range    Lactic Acid 5.1 (HH) 0.5 - 2.2 mmol/L   Ammonia   Result Value Ref Range    Ammonia 33.0 11.0 - 51.0 umol/L   Urinalysis   Result Value Ref Range    Color, UA AMBER (A) Straw/Yellow    Clarity, UA CLOUDY (A) Clear    Glucose, Ur Negative Negative mg/dL    Bilirubin Urine SMALL (A) Negative    Ketones, Urine Negative Negative mg/dL    Specific Gravity, UA >=1.030 1.005 - 1.030    Blood, Urine LARGE (A) Negative    pH, UA 5.0 5.0 - 9.0    Protein, UA TRACE Negative mg/dL     Urobilinogen, Urine 0.2 <2.0 E.U./dL    Nitrite, Urine POSITIVE (A) Negative    Leukocyte Esterase, Urine TRACE (A) Negative   LACTIC ACID, PLASMA   Result Value Ref Range    Lactic Acid 5.5 (HH) 0.5 - 2.2 mmol/L   Basic Metabolic Panel   Result Value Ref Range    Sodium 132 132 - 146 mmol/L    Potassium 4.0 3.5 - 5.0 mmol/L    Chloride 101 98 - 107 mmol/L    CO2 19 (L) 22 - 29 mmol/L    Anion Gap 12 7 - 16 mmol/L    Glucose 110 (H) 74 - 109 mg/dL    BUN 5 (L) 8 - 23 mg/dL    CREATININE 0.6 0.5 - 1.0 mg/dL    GFR Non-African American >60 >=60 mL/min/1.73    GFR African American >60     Calcium 7.4 (L) 8.6 - 10.2 mg/dL   Microscopic Urinalysis   Result Value Ref Range    WBC, UA 1-3 0 - 5 /HPF    RBC, UA 5-10 (A) 0 - 2 /HPF    Bacteria, UA MODERATE (A) /HPF   Basic metabolic panel   Result Value Ref Range    Sodium 132 132 - 146 mmol/L    Potassium 3.4 (L) 3.5 - 5.0 mmol/L    Chloride 102 98 - 107 mmol/L    CO2 18 (L) 22 - 29 mmol/L    Anion Gap 12 7 - 16 mmol/L    Glucose 98 74 - 109 mg/dL    BUN 6 (L) 8 - 23 mg/dL    CREATININE 0.6 0.5 - 1.0 mg/dL    GFR Non-African American >60 >=60 mL/min/1.73    GFR African American >60     Calcium 7.3 (L) 8.6 - 10.2 mg/dL   CBC auto differential   Result Value Ref Range    WBC 7.8 4.5 - 11.5 E9/L    RBC 3.44 (L) 3.50 - 5.50 E12/L    Hemoglobin 10.3 (L) 11.5 - 15.5 g/dL    Hematocrit 30.1 (L) 34.0 - 48.0 %    MCV 90.7 80.0 - 99.9 fL    MCH 29.9 26.0 - 35.0 pg    MCHC 33.0 32.0 - 34.5 %    RDW 17.2 (H) 11.5 - 15.0 fL    Platelets 94 (L) 130 - 450 E9/L    MPV 11.6 7.0 - 12.0 fL    Neutrophils % 87.7 (H) 43.0 - 80.0 %    Lymphocytes %  2.6 (L) 20.0 - 42.0 %    Monocytes % 9.6 2.0 - 12.0 %    Eosinophils % 0.0 0.0 - 6.0 %    Basophils % 0.1 0.0 - 2.0 %    Neutrophils # 6.86 1.80 - 7.30 E9/L    Lymphocytes # 0.23 (L) 1.50 - 4.00 E9/L    Monocytes # 0.78 0.10 - 0.95 E9/L    Eosinophils # 0.00 (L) 0.05 - 0.50 E9/L    Basophils # 0.00 0.00 - 0.20 E9/L    Anisocytosis 2+     Polychromasia  1+     Hypochromia 1+     Poikilocytes 2+     Schistocytes 1+     Burr Cells 2+     Ovalocytes 1+    Magnesium   Result Value Ref Range    Magnesium 1.7 1.6 - 2.6 mg/dL   Phosphorus   Result Value Ref Range    Phosphorus 3.2 2.5 - 4.5 mg/dL   LACTIC ACID, PLASMA   Result Value Ref Range    Lactic Acid 4.8 (HH) 0.5 - 2.2 mmol/L   LACTIC ACID, PLASMA   Result Value Ref Range    Lactic Acid 4.2 (HH) 0.5 - 2.2 mmol/L   Platelet Confirmation   Result Value Ref Range    Platelet Confirmation CONFIRMED    Blood Gas, Arterial   Result Value Ref Range    Date Analyzed [id number]     Time Analyzed 0443     Source: Blood Arterial     pH, Blood Gas 7.405 7.350 - 7.450    PCO2 27.2 (L) 35.0 - 45.0 mmHg    PO2 74.5 60.0 - 100.0 mmHg    HCO3 16.7 (L) 22.0 - 26.0 mmol/L    B.E. -6.8 (L) -3.0 - 3.0 mmol/L    O2 Sat 94.4 92.0 - 98.5 %    O2Hb 93.6 (L) 94.0 - 97.0 %    COHb 0.4 0.0 - 1.5 %    MetHb 0.4 0.0 - 1.5 %    O2 Content 14.8 mL/dL    HHb 5.6 (H) 0.0 - 5.0 %    tHb (est) 11.2 (L) 11.5 - 16.5 g/dL    Mode NC-5  L     Date Of Collection [id number]     Time Collected 0441     Pt Temp 37.0 C    Operator ID [id number]     Lab [id number]     Critical(s) Notified . No Critical Values    LACTIC ACID, PLASMA   Result Value Ref Range    Lactic Acid 3.9 (H) 0.5 - 2.2 mmol/L   Antistreptolysin O titer   Result Value Ref Range    ASO 32 0 - 200 IU/mL   LACTIC ACID, PLASMA   Result Value Ref Range    Lactic Acid 4.4 (HH) 0.5 - 2.2 mmol/L   Hepatic function panel   Result Value Ref Range    Total Protein 4.0 (L) 6.4 - 8.3 g/dL    Alb 1.7 (L) 3.5 - 5.2 g/dL    Alkaline Phosphatase 167 (H) 35 - 104 U/L    ALT 33 (H) 0 - 32 U/L    AST 57 (H) 0 - 31 U/L    Total Bilirubin 2.7 (H) 0.0 - 1.2 mg/dL    Bilirubin, Direct 1.6 (H) 0.0 - 0.3 mg/dL    Bilirubin, Indirect 1.1 (H) 0.0 - 1.0 mg/dL   Blood Gas, Arterial   Result Value Ref Range  Date Analyzed [id number]     Time Analyzed 1750     Source: Blood Arterial     pH, Blood Gas 7.409 7.350 - 7.450    PCO2 25.8  (L) 35.0 - 45.0 mmHg    PO2 68.9 60.0 - 100.0 mmHg    HCO3 15.9 (L) 22.0 - 26.0 mmol/L    B.E. -7.2 (L) -3.0 - 3.0 mmol/L    O2 Sat 93.9 92.0 - 98.5 %    O2Hb 93.2 (L) 94.0 - 97.0 %    COHb 0.3 0.0 - 1.5 %    MetHb 0.4 0.0 - 1.5 %    O2 Content 15.8 mL/dL    HHb 6.1 (H) 0.0 - 5.0 %    tHb (est) 12.0 11.5 - 16.5 g/dL    Mode NC-  4lpm     Date Of Collection [id number]     Time Collected 1749     Pt Temp 37.0 C    Operator ID [id number]     Lab [id number]     Critical(s) Notified . No Critical Values    CBC auto differential   Result Value Ref Range    WBC 23.6 (H) 4.5 - 11.5 E9/L    RBC 3.27 (L) 3.50 - 5.50 E12/L    Hemoglobin 9.8 (L) 11.5 - 15.5 g/dL    Hematocrit 96.229.6 (L) 34.0 - 48.0 %    MCV 90.5 80.0 - 99.9 fL    MCH 30.0 26.0 - 35.0 pg    MCHC 33.1 32.0 - 34.5 %    RDW 17.7 (H) 11.5 - 15.0 fL    Platelets 87 (L) 130 - 450 E9/L    MPV 12.4 (H) 7.0 - 12.0 fL    Neutrophils % 85.2 (H) 43.0 - 80.0 %    Immature Granulocytes % 1.8 0.0 - 5.0 %    Lymphocytes % 8.7 (L) 20.0 - 42.0 %    Monocytes % 4.2 2.0 - 12.0 %    Eosinophils % 0.0 0.0 - 6.0 %    Basophils % 0.1 0.0 - 2.0 %    Neutrophils # 20.07 (H) 1.80 - 7.30 E9/L    Immature Granulocytes # 0.42 E9/L    Lymphocytes # 2.05 1.50 - 4.00 E9/L    Monocytes # 0.98 (H) 0.10 - 0.95 E9/L    Eosinophils # 0.00 (L) 0.05 - 0.50 E9/L    Basophils # 0.03 0.00 - 0.20 E9/L    Anisocytosis 2+     Polychromasia 1+     Poikilocytes 2+     Burr Cells 2+     Ovalocytes 1+    Magnesium   Result Value Ref Range    Magnesium 2.1 1.6 - 2.6 mg/dL   Phosphorus   Result Value Ref Range    Phosphorus 3.5 2.5 - 4.5 mg/dL   LACTIC ACID, PLASMA   Result Value Ref Range    Lactic Acid 5.0 (HH) 0.5 - 2.2 mmol/L   Comprehensive Metabolic Panel   Result Value Ref Range    Sodium 137 132 - 146 mmol/L    Potassium 3.9 3.5 - 5.0 mmol/L    Chloride 104 98 - 107 mmol/L    CO2 17 (L) 22 - 29 mmol/L    Anion Gap 16 7 - 16 mmol/L    Glucose 40 (L) 74 - 109 mg/dL    BUN 8 8 - 23 mg/dL    CREATININE 0.7 0.5 - 1.0 mg/dL     GFR Non-African American >  60 >=60 mL/min/1.73    GFR African American >60     Calcium 7.5 (L) 8.6 - 10.2 mg/dL    Total Protein 3.9 (L) 6.4 - 8.3 g/dL    Alb 1.6 (L) 3.5 - 5.2 g/dL    Total Bilirubin 3.1 (H) 0.0 - 1.2 mg/dL    Alkaline Phosphatase 176 (H) 35 - 104 U/L    ALT 33 (H) 0 - 32 U/L    AST 62 (H) 0 - 31 U/L   Protime-INR   Result Value Ref Range    Protime 32.0 (H) 9.3 - 12.4 sec    INR 2.8    LACTIC ACID, PLASMA   Result Value Ref Range    Lactic Acid 4.4 (HH) 0.5 - 2.2 mmol/L   Platelet Confirmation   Result Value Ref Range    Platelet Confirmation CONFIRMED    LACTIC ACID, PLASMA   Result Value Ref Range    Lactic Acid 3.1 (H) 0.5 - 2.2 mmol/L   Vancomycin, trough   Result Value Ref Range    Vancomycin Tr 20.0 (H) 5.0 - 16.0 mcg/mL   LACTIC ACID, PLASMA   Result Value Ref Range    Lactic Acid 2.8 (H) 0.5 - 2.2 mmol/L   CBC auto differential   Result Value Ref Range    WBC 20.0 (H) 4.5 - 11.5 E9/L    RBC 2.80 (L) 3.50 - 5.50 E12/L    Hemoglobin 8.4 (L) 11.5 - 15.5 g/dL    Hematocrit 72.5 (L) 34.0 - 48.0 %    MCV 92.9 80.0 - 99.9 fL    MCH 30.0 26.0 - 35.0 pg    MCHC 32.3 32.0 - 34.5 %    RDW 18.0 (H) 11.5 - 15.0 fL    Platelets 64 (L) 130 - 450 E9/L    MPV 11.8 7.0 - 12.0 fL    Neutrophils % 80.5 (H) 43.0 - 80.0 %    Immature Granulocytes % 0.5 0.0 - 5.0 %    Lymphocytes % 11.8 (L) 20.0 - 42.0 %    Monocytes % 7.2 2.0 - 12.0 %    Eosinophils % 0.0 0.0 - 6.0 %    Basophils % 0.0 0.0 - 2.0 %    Neutrophils # 16.07 (H) 1.80 - 7.30 E9/L    Immature Granulocytes # 0.11 E9/L    Lymphocytes # 2.37 1.50 - 4.00 E9/L    Monocytes # 1.45 (H) 0.10 - 0.95 E9/L    Eosinophils # 0.00 (L) 0.05 - 0.50 E9/L    Basophils # 0.01 0.00 - 0.20 E9/L    Anisocytosis 2+     Polychromasia 1+     Hypochromia 1+     Poikilocytes 1+     Burr Cells 1+     Ovalocytes 1+     Target Cells 1+    Magnesium   Result Value Ref Range    Magnesium 2.0 1.6 - 2.6 mg/dL   Phosphorus   Result Value Ref Range    Phosphorus 4.0 2.5 - 4.5 mg/dL    Comprehensive Metabolic Panel   Result Value Ref Range    Sodium 140 132 - 146 mmol/L    Potassium 3.6 3.5 - 5.0 mmol/L    Chloride 105 98 - 107 mmol/L    CO2 19 (L) 22 - 29 mmol/L    Anion Gap 16 7 - 16 mmol/L    Glucose 61 (L) 74 - 109 mg/dL    BUN 13 8 -  23 mg/dL    CREATININE 0.8 0.5 - 1.0 mg/dL    GFR Non-African American >60 >=60 mL/min/1.73    GFR African American >60     Calcium 8.1 (L) 8.6 - 10.2 mg/dL    Total Protein 4.7 (L) 6.4 - 8.3 g/dL    Alb 2.9 (L) 3.5 - 5.2 g/dL    Total Bilirubin 4.3 (H) 0.0 - 1.2 mg/dL    Alkaline Phosphatase 162 (H) 35 - 104 U/L    ALT 27 0 - 32 U/L    AST 50 (H) 0 - 31 U/L   Protime-INR   Result Value Ref Range    Protime 29.6 (H) 9.3 - 12.4 sec    INR 2.6    Platelet Confirmation   Result Value Ref Range    Platelet Confirmation CONFIRMED    LACTIC ACID, PLASMA   Result Value Ref Range    Lactic Acid 3.9 (H) 0.5 - 2.2 mmol/L   CBC auto differential   Result Value Ref Range    WBC 12.7 (H) 4.5 - 11.5 E9/L    RBC 2.51 (L) 3.50 - 5.50 E12/L    Hemoglobin 7.4 (L) 11.5 - 15.5 g/dL    Hematocrit 16.1 (L) 34.0 - 48.0 %    MCV 92.0 80.0 - 99.9 fL    MCH 29.5 26.0 - 35.0 pg    MCHC 32.0 32.0 - 34.5 %    RDW 18.0 (H) 11.5 - 15.0 fL    Platelets 51 (L) 130 - 450 E9/L    MPV 12.3 (H) 7.0 - 12.0 fL    Neutrophils % 89.0 (H) 43.0 - 80.0 %    Lymphocytes % 4.0 (L) 20.0 - 42.0 %    Monocytes % 6.0 2.0 - 12.0 %    Eosinophils % 1.0 0.0 - 6.0 %    Basophils % 0.0 0.0 - 2.0 %    Neutrophils # 11.30 (H) 1.80 - 7.30 E9/L    Lymphocytes # 0.51 (L) 1.50 - 4.00 E9/L    Monocytes # 0.76 0.10 - 0.95 E9/L    Eosinophils # 0.13 0.05 - 0.50 E9/L    Basophils # 0.00 0.00 - 0.20 E9/L    nRBC 1.0 /100 WBC    Anisocytosis 1+     Polychromasia 1+     Hypochromia 1+     Poikilocytes 1+     Ovalocytes 1+     Target Cells 1+    Comprehensive Metabolic Panel   Result Value Ref Range    Sodium 145 132 - 146 mmol/L    Potassium 2.8 (L) 3.5 - 5.0 mmol/L    Chloride 107 98 - 107 mmol/L    CO2 21 (L) 22 - 29 mmol/L     Anion Gap 17 (H) 7 - 16 mmol/L    Glucose 102 74 - 109 mg/dL    BUN 16 8 - 23 mg/dL    CREATININE 0.8 0.5 - 1.0 mg/dL    GFR Non-African American >60 >=60 mL/min/1.73    GFR African American >60     Calcium 8.2 (L) 8.6 - 10.2 mg/dL    Total Protein 5.1 (L) 6.4 - 8.3 g/dL    Alb 3.6 3.5 - 5.2 g/dL    Total Bilirubin 4.0 (H) 0.0 - 1.2 mg/dL    Alkaline Phosphatase 188 (H) 35 - 104 U/L    ALT 26 0 - 32 U/L    AST 46 (H) 0 - 31 U/L   Magnesium  Result Value Ref Range    Magnesium 1.9 1.6 - 2.6 mg/dL   Platelet Confirmation   Result Value Ref Range    Platelet Confirmation CONFIRMED    POCT Glucose   Result Value Ref Range    Meter Glucose 120 (H) 70 - 110 mg/dL   POCT Glucose   Result Value Ref Range    Meter Glucose 83 70 - 110 mg/dL   POCT Glucose   Result Value Ref Range    Meter Glucose 83 70 - 110 mg/dL   POCT Glucose   Result Value Ref Range    Meter Glucose 72 70 - 110 mg/dL   EKG 12 Lead   Result Value Ref Range    Ventricular Rate 53 BPM    Atrial Rate 46 BPM    QRS Duration 72 ms    Q-T Interval 474 ms    QTc Calculation (Bazett) 444 ms    R Axis -12 degrees    T Axis -81 degrees   EKG 12 Lead   Result Value Ref Range    Ventricular Rate 98 BPM    Atrial Rate 98 BPM    P-R Interval 286 ms    QRS Duration 78 ms    Q-T Interval 394 ms    QTc Calculation (Bazett) 503 ms    P Axis 15 degrees    R Axis -17 degrees    T Axis -172 degrees   EKG 12 Lead   Result Value Ref Range    Ventricular Rate 97 BPM    Atrial Rate 234 BPM    QRS Duration 70 ms    Q-T Interval 484 ms    QTc Calculation (Bazett) 614 ms    R Axis -19 degrees    T Axis 150 degrees   EKG 12 Lead   Result Value Ref Range    Ventricular Rate 72 BPM    Atrial Rate 227 BPM    QRS Duration 70 ms    Q-T Interval 354 ms    QTc Calculation (Bazett) 387 ms    R Axis -13 degrees    T Axis -149 degrees   EKG 12 Lead   Result Value Ref Range    Ventricular Rate 99 BPM    Atrial Rate 99 BPM    P-R Interval 206 ms    QRS Duration 66 ms    Q-T Interval 306 ms     QTc Calculation (Bazett) 392 ms    P Axis 103 degrees    R Axis -17 degrees    T Axis -174 degrees   EKG 12 Lead   Result Value Ref Range    Ventricular Rate 60 BPM    Atrial Rate 68 BPM    QRS Duration 64 ms    Q-T Interval 426 ms    QTc Calculation (Bazett) 426 ms    R Axis -10 degrees    T Axis 93 degrees   EKG 12 Lead   Result Value Ref Range    Ventricular Rate 75 BPM    Atrial Rate 234 BPM    QRS Duration 64 ms    Q-T Interval 410 ms    QTc Calculation (Bazett) 457 ms    P Axis 66 degrees    R Axis -10 degrees    T Axis 16 degrees   EKG 12 Lead   Result Value Ref Range    Ventricular Rate 103 BPM    Atrial Rate 55 BPM  QRS Duration 78 ms    Q-T Interval 304 ms    QTc Calculation (Bazett) 398 ms    R Axis -11 degrees    T Axis 128 degrees   EKG 12 Lead   Result Value Ref Range    Ventricular Rate 102 BPM    Atrial Rate 102 BPM    P-R Interval 248 ms    QRS Duration 66 ms    Q-T Interval 306 ms    QTc Calculation (Bazett) 398 ms    P Axis 141 degrees    R Axis -10 degrees    T Axis 50 degrees   EKG 12 Lead   Result Value Ref Range    Ventricular Rate 100 BPM    Atrial Rate 100 BPM    P-R Interval 192 ms    QRS Duration 78 ms    Q-T Interval 360 ms    QTc Calculation (Bazett) 464 ms    P Axis 95 degrees    R Axis -12 degrees    T Axis 19 degrees   EKG 12 Lead   Result Value Ref Range    Ventricular Rate 120 BPM    Atrial Rate 108 BPM    QRS Duration 64 ms    Q-T Interval 336 ms    QTc Calculation (Bazett) 474 ms    R Axis -14 degrees    T Axis -8 degrees   EKG 12 Lead   Result Value Ref Range    Ventricular Rate 81 BPM    Atrial Rate 53 BPM    QRS Duration 72 ms    Q-T Interval 510 ms    QTc Calculation (Bazett) 592 ms    R Axis -6 degrees    T Axis 15 degrees   EKG 12 Lead   Result Value Ref Range    Ventricular Rate 92 BPM    Atrial Rate 92 BPM    P-R Interval 190 ms    QRS Duration 88 ms    Q-T Interval 348 ms    QTc Calculation (Bazett) 430 ms    R Axis 4 degrees    T Axis 61 degrees   EKG 12 Lead   Result  Value Ref Range    Ventricular Rate 100 BPM    Atrial Rate 100 BPM    P-R Interval 186 ms    QRS Duration 90 ms    Q-T Interval 308 ms    QTc Calculation (Bazett) 397 ms    P Axis 132 degrees    R Axis 11 degrees    T Axis 180 degrees   EKG 12 Lead   Result Value Ref Range    Ventricular Rate 104 BPM    Atrial Rate 98 BPM    QRS Duration 80 ms    Q-T Interval 302 ms    QTc Calculation (Bazett) 397 ms    R Axis -6 degrees    T Axis -143 degrees   TYPE AND SCREEN   Result Value Ref Range    ABO/Rh A NEG     Antibody Screen NEG        RADIOLOGY:  Interpreted by Radiologist.  XR Chest Portable   Final Result   Stable pulmonary edema and effusions, unchanged from prior   exam.          XR Chest Portable   Final Result   Persistent CHF pattern with bibasilar hazy opacities suggesting   pleural effusions and/or atelectasis. A  superimposed pneumonic   infiltrate would be difficult to exclude and should be considered in   the appropriate clinical context.      XR Chest Abdomen Ng Placement   Final Result   1. Tip of the feeding tube projecting in the left upper quadrant most   likely in the proximal jejunal loops.      CT ABDOMEN PELVIS W IV CONTRAST Additional Contrast? None   Final Result   1. Infiltrates and pleural effusion in the lung bases which may be due   to pneumonia or edema.      2. Cirrhosis of the liver with a large amount of ascites and question   of nonocclusive filling defect in the portal confluence which may   represent a thrombus without significant occlusion of the the portal   veins.      3. Large amount of ascites with somewhat thickened bowel loops which   may be due to ascites or enterocolitis.      4. Distended gallbladder with wall thickening which may be due to   ascites or cholecystitis.            XR Chest Portable   Final Result   1. Significant interval worsening of right pleural effusion.   2. Stable left effusion.   3. Worsening pulmonary edema.          US ABDOMINAL LIMITED   Final Result    1. Liver cirrhosis.   2. Lack of flow in the portal vein, possible thrombosis.   3. Ascites.      XR Chest Portable   Final Result   Findings/IMPRESSION: Interval placement of right internal jugular   central venous catheter terminating in the cavoatrial junction.   Otherwise unchanged exam. No pneumothorax.          XR Chest Portable   Final Result   1. Worsening bilateral effusions and edema.   2. New foreign bodies within the upper mediastinum can represent   interval trauma.          CT Head WO Contrast   Final Result      No acute intracranial findings.      Chronic microvascular ischemic changes and parenchymal volume loss.            XR Chest Portable   Final Result   Cardiomegaly with pulmonary edema and a small right pleural effusion.        EKG:  This EKG is signed and interpreted by the EP.    Time: 0957  Rate: 53  Rhythm: Atrial fibrillation  Interpretation: Poor baseline  Comparison: stable as compared to patient's most recent EKG    ------------------------- NURSING NOTES AND VITALS REVIEWED ---------------------------   The nursing notes within the ED encounter and vital signs as below have been reviewed.   BP 126/60    Pulse 107    Temp 94.1 ??F (34.5 ??C) (Axillary)    Resp 18    Ht 5\' 5"  (1.651 m)    Wt 181 lb (82.1 kg)    SpO2 98%    BMI 30.12 kg/m??   Oxygen Saturation Interpretation: Normal    ---------------------------------------------------PHYSICAL EXAM--------------------------------------    Constitutional/General: Alert and oriented x1 (person). Slow to respond to questioning and generally confused.  Head: NC/AT  Eyes: PERRL, EOMI  Mouth: Oropharynx clear, handling secretions,   Neck: Supple, full ROM, no meningeal signs  Pulmonary: Lungs clear to auscultation bilaterally, no wheezes, rales, or rhonchi. Not in respiratory distress  Cardiovascular:  Regular rate and rhythm, no murmurs, gallops, or rubs. 2+ distal pulses  Abdomen: Soft, non tender, non distended. No guarding or  rebound.  Extremities: Moves all extremities x 4. Warm and well perfused  Skin: warm and dry without rash. Skin intact.   Neurologic: No focal deficits. Cranial nerves II-XII grossly intact. 5/5 muscle strength to extremities x 4.   Psych: Normal Affect      ------------------------------ ED COURSE/MEDICAL DECISION MAKING----------------------  Medications   sodium chloride flush 0.9 % injection 10 mL (10 mLs Intravenous Given 04/16/16 0826)   sodium chloride flush 0.9 % injection 10 mL (10 mLs Intravenous Given 04/15/16 1004)   trimethobenzamide (TIGAN) injection 200 mg (not administered)   ipratropium-albuterol (DUONEB) nebulizer solution 1 ampule (1 ampule Inhalation Given 04/14/16 1711)   acetaminophen (TYLENOL) suppository 650 mg (650 mg Rectal Given 04/11/16 0506)   mineral oil-hydrophilic petrolatum (HYDROPHOR) ointment (not administered)   sodium chloride (PF) 0.9 % injection 10 mL (10 mLs Intravenous Given 04/16/16 0825)   miconazole nitrate 2 % ointment ( Topical Given 04/16/16 0816)     And   miconazole nitrate 2 % ointment ( Topical Given 04/12/16 0557)   diphenhydrAMINE (BENADRYL) 50 MG/ML injection (  Canceled Entry 04/11/16 0825)   sodium chloride flush 0.9 % injection 10 mL (10 mLs Intravenous Given 04/16/16 0815)   sodium chloride flush 0.9 % injection 10 mL (10 mLs Intravenous Given 04/15/16 1601)   acetaminophen (TYLENOL) tablet 650 mg (650 mg Oral Given 04/14/16 1109)   magnesium hydroxide (MILK OF MAGNESIA) 400 MG/5ML suspension 30 mL (not administered)   famotidine (PEPCID) injection 20 mg (20 mg Intravenous Given 04/16/16 0816)   thiamine (B-1) injection 100 mg (100 mg Intravenous Given 04/16/16 0816)   enoxaparin (LOVENOX) injection 80 mg (80 mg Subcutaneous Not Given 04/16/16 0815)   glucose (GLUTOSE) 40 % oral gel 15 g (not administered)   dextrose 50 % solution 12.5 g (12.5 g Intravenous Given 04/13/16 0657)   glucagon (rDNA) injection 1 mg (not administered)   dextrose 5 % solution (not  administered)   meropenem (MERREM) 1 g in sterile water 20 mL IV syringe (1 g Intravenous Given 04/16/16 1002)   glycopyrrolate (ROBINUL) tablet 1 mg (1 mg Oral Not Given 04/16/16 0815)   glycopyrrolate (ROBINUL) injection 0.2 mg (0.2 mg Intravenous Given 04/16/16 1128)   morphine (PF) injection 1 mg (1 mg Intravenous Given 04/16/16 1129)   LORazepam (ATIVAN) injection 0.5 mg (0.5 mg Intravenous Given 04/16/16 0619)   0.9 % sodium chloride bolus (0 mLs Intravenous Stopped 04/03/16 1246)   potassium chloride 10 mEq/100 mL IVPB (Peripheral Line) (10 mEq Intravenous New Bag 04/03/16 2013)   magnesium sulfate 2 g in 50 mL IVPB premix (0 g Intravenous Stopped 04/03/16 2051)   potassium chloride 10 mEq/100 mL IVPB (Peripheral Line) (10 mEq Intravenous New Bag 04/04/16 1150)   LORazepam (ATIVAN) injection 1 mg (1 mg Intravenous Given 04/11/16 0029)   fentaNYL (SUBLIMAZE) injection 25 mcg (25 mcg Intravenous Given 04/11/16 0632)   vancomycin (VANCOCIN) 1,500 mg in sodium chloride 0.9 % 300 mL IVPB (0 mg Intravenous Stopped 04/11/16 1016)   magnesium sulfate 2 g in 50 mL IVPB premix (0 g Intravenous Stopped 04/11/16 1016)   sodium phosphate 20 mmol in dextrose 5 % 250 mL IVPB (0 mmol Intravenous Stopped 04/11/16 1226)   0.9 % sodium chloride bolus (0 mLs Intravenous Stopped 04/11/16 1456)   0.9 % sodium chloride bolus (0 mLs Intravenous  Stopped 04/11/16 2100)   0.9 % sodium chloride bolus (0 mLs Intravenous Stopped 04/12/16 0201)   potassium chloride 20 mEq/50 mL IVPB (Central Line) (20 mEq Intravenous New Bag 04/12/16 0728)   magnesium sulfate 2 g in 50 mL IVPB premix (0 g Intravenous Stopped 04/12/16 1110)   iopamidol (ISOVUE-370) 76 % injection 110 mL (110 mLs Intravenous Given 04/12/16 1104)   LORazepam (ATIVAN) 2 MG/ML injection (1 mg  Given 04/12/16 1511)   furosemide (LASIX) injection 40 mg (40 mg Intravenous Given 04/12/16 1751)   albumin human 25 % solution 25 g (25 g Intravenous New Bag 04/15/16 0309)   furosemide  (LASIX) injection 40 mg (40 mg Intravenous Given 04/13/16 1835)   furosemide (LASIX) injection 40 mg (40 mg Intravenous Given 04/14/16 1852)       Medical Decision Making:    Family noticed patient wasn't as awake and responsive as normal but no syncope. Apparently right when paramedics were evaluating is when she became acutely unresponsive and they noted the vtach on monitor. She resolved with shock x1 and since has remained stable although still somewhat hypersomnolent. Family states she is better than she was but still is less alert than her baseline. We discussed at length for any preceding symptoms and they deny any complaints or appearance of pain for this patient recently. She appears comfortably at rest. She is arrousable. I do not suspect patient is actively in pain and ekg is unchanged from prior so I do not suspect ACS at this time.  We feel the patient would benefit from admission for further evaluation and management.     Re-Evaluation:  Re-evaluation.  Patient???s symptoms are improving  Repeat physical examination is improved    Counseling:   The emergency provider has spoken with the patient and discussed today???s results, in addition to providing specific details for the plan of care and counseling regarding the diagnosis and prognosis.  Questions are answered at this time and they are agreeable with the plan.      --------------------------------- IMPRESSION AND DISPOSITION ---------------------------------    IMPRESSION  1. V-tach (HCC)    2. Hypokalemia        DISPOSITION  Disposition: Admit to CCU/ICU  Patient condition is fair    04/03/16, 9:22 AM.    This note is prepared by Kirby Crigler, acting as Scribe for Wayne Sever, MD.    Wayne Sever, MD:  The scribe's documentation has been prepared under my direction and personally reviewed by me in its entirety.  I confirm that the note above accurately reflects all work, treatment, procedures, and medical decision making performed by me.         Wayne Sever, MD  04/16/16 [phone]

## 2016-04-03 NOTE — Progress Notes (Signed)
Spoke with Dr. Renato BattlesKassis-George's NP Marchelle FolksAmanda regarding orders. Also informed her that the patient had two runs of v-fib asymptomatic.     Dr. Myrtis SerKatz notified of two runs of v-fib. First run 26 seconds. Second run 20 seconds.Orders obtained. copy of telemetry event faxed to [phone].     Audley HoseAmanda L Weitzman

## 2016-04-03 NOTE — Progress Notes (Addendum)
SPEECH PATHOLOGY DEPARTMENT  CLINICAL SWALLOWING EVALUATION  NAME:  [name]  DOB:  12/29/1927  DATE: 04/03/2016  ROOM:  6414/6414-B    DYSPHAGIA DIAGNOSIS: questionable esophageal impairment recommend GI consult    DIET RECOMMENDATIONS:  NPO until GI consult     RECOMMENDATIONS:  []  Modified Barium Swallow Study (MBSS) is recommended and requires a physician order  []  Therapy is not recommended   [x]  Therapy is recommended to:  Ongoing assessment once patient is able to take in more orally to determine safest least restrictive diet consistency.   []  Provide ongoing meal endurance analysis to monitor pt's tolerance for prescribed diet and provide safe swallowing compensatory strategies as appropriate.     []  Improve oral motor strength/ROM    []  Improve tongue base retraction    []  Improve laryngeal strength/ROM    []  Other:   FEEDING RECOMMENDATIONS: (check all that apply)  Supervision with              PO intake           Eat in upright position             Small bites/sips from cup            Alternate solids and liquids               Check for oral pocketing              Place food on left side              of tongue Place food on right side              of tongue No straw              Spoon sip liquids              Liquids via cup              administration       Double swallow            Multiple swallow              Throat clear/swallow              Effortful swallow            Bascom Levels water protocol            [employer] water protocol          Very limited amount        MEDICATION ADMINISTRATION:  Administer medication as per patient preference            Administer medication whole,   one at a time, with water                Administer medication whole/crushed in applesauce/pudding            Administer medication via   IV/NG/PEG tube          x     SUBJECTIVE ASSESSMENT/PATIENT REPORT:  Symptoms reported by patient: (check all that apply)  Drooling              Coughing            Choking             extubated              Not able to keep anything down  X  Globus sensation            History of aspiration              and/or pneumonia Difficulty swallowing pills              Feeding method:  Independent in self-feeding      x      Needs assistance to self-feed             Dependent for feeding              Endurance during meals:  Good for duration of meal            Fair for duration of meal           Poor for duration of meal          x Variable (comment)              Mental status:    Alert          x Responsive          x  Cooperative          x    Confused                Lethargic                Impulsive              Uncooperative                Combative             Unresponsive                OBJECTIVE ASSESSMENT:  Current respiratory status:  Room air      x        Nasal cannula            O2 mask             Non-rebreather mask              Tracheostomy,              room air Tracheostomy,              supplemental O2  BiPAP            Vent dependent              Respiratory sufficiency and coordination:  WNL          x   Mildly impaired            Moderately impaired            Severely impaired              Current nutritional status:  []  Oral   [x]  NPO  Regular at home  Oral mechanism examination:      []  Adequate lingual/labial strength [x]  Generalized oral weakness    []  Left labiobuccal weakness  []  Right labiobuccal weakness    []  Left lingual deviation  []  Right lingual deviation     []  Oral apraxia             []  CNT  Dentition: []  Natural    [x]  Missing teeth/teeth in poor repair    []  Edentulous    []  Dentures    []  Implants  Vocal quality prior to PO intake: [x]  WFL  []  Weak  []  Wet    Factors  affecting performance:  No difficulties participating   in assessment           x Impairment or difficulty noted   in mental status            Impairment or difficulty noted   in following directions            Impairment or difficulty noted   In endurance              Positioning:    Seated in wheelchair/chair               Upright seated in bed           x Reclined, head of bed  >   ??                Saliva swallows:  WNL              (serous and mucoidal secretions)           Impaired          X  Eventually even saliva comes back up Xerostomia              Consistencies Administered During the Evaluation:     Solids:  []  Puree/pudding  Liquids: [x]  Thin consistency--- ice chips and tsp sip of water      []  Soft solid     []  Nectar consistency      []  Solid    []  Honey consistency    Method of Intake: [x]  Teaspoon  []  Cup  []  Straw  Mealtime assessment?   no    []  Modified Blue Dye tracheostomy protocol utilized    RESULTS OF ASSESSMENT    Oral Stage: []  Normal  [x]  Functional  []  Abnormal    []  Inadequate labial seal resulting anterior labial spillage from:     []  right     []  left     []  midline    []  Decreased mastication due to:     []  poor/missing dentition     []  decreased lingual control     []  vertical munch     []  other:    []  Delayed A-P transit due to:     []  decreased initiation     []  poor tongue movement     []  cognitive function   []  Oral residuals:        []  throughout oral cavity      []  anterior sulcus     []  left lateral sulcus       []  right lateral sulcus     []  other:       Comments:       Pharyngeal Stage:   []  Normal [x]  Functional []  Abnormal  But very limited intake due frothy secretions coming back up with limited amount presented  [x]   Although no OVERT clinical signs/symptoms of aspiration were present during this evaluation, silent aspiration cannot be definitively ruled out during a clinical swallow evalution.    **If silent aspiration is suspected, a Modified Barium Swallow Study (MBSS) is recommended and requires a physician order.    Salient observations:  []   OVERT clinical signs/symptoms of aspiration were present during this evaluation.  A Modified Barium Swallow Study (MBSS) is recommended and requires a physician order.     SALIENT OBSERVATIONS  (check  all that apply) Thin consistency liquids Nectar consistency liquids Honey consistency liquids Puree consistency   solids Solid consistency  Immediate re-directive throat clear        Latent re-directive throat clear        Immediate re-directive cough        Latent re-directive cough        Wet vocal quality          Wet respirations          Change in respiratory pattern        Skin flushing          Eye watering              []  Congested cough throughout evaluation   []  Absent swallow    Comments: patient took tolerated 5 small ice chips with no cough or change in vocal quality.  She also tolerated 1 tsp of water with no cough or change in vocal quality but after this amount she started expelling frothy secretions which is frequently present with decreased esophageal motility and or stricture     With utilization of modified blue dye tracheostomy protocol:  Upon suctioning, blue dye was:      []  present in secretions      []  absent from secretions   Comments:    EDUCATION/GOAL PLANNING  Education completed with:      [x]  patient      []  family    Regarding:      [x]  diagnosis      [x]  treatment      [x]  prognosis      [x]  plan of care  Speech Pathologist (SLP) completed education with the patient/family regarding type of swallowing impairment. Reviewed current solid/liquid consistency diet recommendations and discussed compensatory strategies to ensure safe PO intake. Reviewed aspiration precautions. Encouraged pt and/or family to engage SLP in unstructured Q&A session relative to identified deficit areas. family indicated understanding of all information provided via satisfactory verbal response.    [x]  Patient was an active participant in goal planning process.  []  Patient was unable to participate in goal planning process.  []  Goal planning not appropriate at this time as intervention was not recommended.  This plan will be re-evaluated and revised in  1  week   if warranted.   Prognosis for improvements is     []  good          [x]  fair       []  guarded       []  poor  [x]  The admitting diagnosis and active problem list as listed below have been reviewed prior to the initiation of this evaluation.   NON SUSTAINED VENTICULAR TACHYCARDIA     Patient Active Problem List   Diagnosis   ??? A-fib (HCC)   ??? Moderate protein-calorie malnutrition (HCC)   ??? Non-sustained ventricular tachycardia (HCC)   ??? COPD (chronic obstructive pulmonary disease) (HCC)   ??? Mixed hyperlipidemia     Malnutrition indicators have been identified?  yes  Dietitian consulted? yes     Tonette LedererLisa Reana Chacko MSCCC/SLP  Speech Language Pathologist  [phone]

## 2016-04-03 NOTE — Progress Notes (Signed)
Speech Language Pathology      NAME:  [name]  DOB:  [birth year]  DATE: 04/03/2016  ROOM:  6414/6414-B  Patient Active Problem List   Diagnosis   ??? A-fib (HCC)   ??? Moderate protein-calorie malnutrition (HCC)   ??? Non-sustained ventricular tachycardia (HCC)   ??? COPD (chronic obstructive pulmonary disease) (HCC)   ??? Mixed hyperlipidemia       Patient seen for swallow therapy 15 minutes.   patient educated regarding type of swallowing impairment. Reviewed current solid/liquid consistency diet recommendation for NPO except very limited ice chips to allow for moistening of oral cavity, and discussed compensatory strategies to ensure safe PO intake. Reviewed aspiration precautions. patient will require ongoing education regarding dysphagia secondary to impaired cognition.    Tonette LedererLisa Isabellamarie Randa MSCCC/SLP  Speech Language Pathologist  [phone]

## 2016-04-03 NOTE — Progress Notes (Signed)
Code called 1659. Pt vfib on the monitor & pulseless. Was on the phone with pts daughter when pt sustained vfib and per pts daughter would like pt to remain full code. Hung up phone with daughter to go attend to pt.

## 2016-04-03 NOTE — Progress Notes (Signed)
Dr. Myrtis SerKatz notified of new consult through perfect serve.   Audley HoseAmanda L Weitzman

## 2016-04-03 NOTE — Consults (Addendum)
I have reviewed the notes, assessments, and/or procedures performed by Georgian CoAmanda Kotheimer, NP, I examined and interviewed the patient and the patient's family, and I concur with her/his documentation of Gay FillerRuth E Brett.      NSTEMI, type 1 versus type 2. Patient is unreliable historian but does not appear to have any anginal pain now. Per family did not have any signs of pain prior to VT arrest.  Cardiac arrest  VT - monomorphic on 2 s rhythm strip  Inferior infarct pattern, poor precordial R wave progression, TWIs  Volume depleted  Dementia, poor historian  Known CAD  PAF, OAC with pradaxa   Unknown LVEF        IVF  Stat TTE  Heparin gtt 48 hours  Aspirin  Hold NOAC for now  BB  ACE I  Amiodarone unless any suspicion of prolonged qt    Liam RogersHayah M. Kassis-George, M.D.  Advanced Heart Failure and Pulmonary Hypertension  [place] [phone]

## 2016-04-03 NOTE — Consults (Signed)
Inpatient Cardiology Consultation      Reason for Consult:  VT    Consulting Physician: Dr. Lidia CollumKassis-George    Requesting Physician:  Dr. Fredric MareBailey     Date of Consultation: 04/03/2016    HISTORY OF PRESENT ILLNESS: [name] is a 82 year old elderly female who is [facility]. PMH: Permanent Afib (on Pradaxa), Asthma/COPD, GERD, HLD, Hypothyroidism, restless leg syndrome. No prior history of CVA or CAD.     [name] after she was found "unresponsive" per patient's daughter. Patient is a limited historian and the following information was obtained from the patient's daughter at the bedside and patient's medical record. Patient's daughter reported that [name] has been feeling nausea x 3 days with intermittent chills. She was seen by her PCP on 04/02/2016 and was diagnosed with the "stomach flu" and was given Zofran. However, when the patient's daughter when to wake her up this am she reported that she was having "slurred speech" and was confused. Approximately 5 minutes after waking her mom up she became lethargic and  EMS was summoned. On arrival of EMS, patient was lethargic and was noted to be in VT on monitor. CPR was initiated and patient was shocked 360 J converting to SR. Patient was transferred to ED for further management. Cardiology and EP were consulted for VT.    Upon arrival to the ED: Labs included Na+ 138, K+ 3.2 (replaced with K+ 40 mEq), BUN 18, Cr 0.9, troponin 0.03, digoxin level 1.2, WBC 9.1, Hgb 11.8, Hct 36.4, platelet count 159, INR 2.0, pH 7.517, pCO2 37.5, pO2 101.6, HCO3 29.7. CXR: cardiomegaly with pulmonary edema and a small right pleural effusion. Patient was treated with a 500 cc bolus NS IV. Upon initial consultation, patient is sitting up in bed and complains of "thick productive brown cough." Denies CP and SOB. VSS. Patient is alert and oriented to self only and is HOH.     Please note: past medical  records were reviewed per electronic medical record (EMR) - see detailed reports under Past Medical/ Surgical History.   Past Medical History:    1. Permanent Afib: CHA2DS2-VASc score at least 5 (HTN, Age, Female, Vascular disease). Managed by PCP.    A. Denies prior DCCV.    B. On Pradaxa and Digoxin 125 mcg every 48 hours.   2. COPD  3. GERD  4. HLD,   5. Hypothyroidism, on replacement therapy   6. Restless leg syndrome  7. Reported "Normal" [name]. No known CAD.   8. Bilateral knee replacement  9. Hysterectomy  10. History of lumbar back surgery  11. History of Perforated Viscus s/p Laparoscopic repair of perforates ulcer with omental Cheree DittoGraham patch and a falciform Graham patch   12. Poor dentition   13. Varicose Vein surgery bilaterally   14. Depression  15. Probable Dementia     Medications Prior to admit:  Prior to Admission medications    Medication Sig Start Date End Date Taking? Authorizing Provider   ondansetron (ZOFRAN) 4 MG tablet Take 1 tablet by mouth every 8 hours as needed for Nausea or Vomiting 02/26/16   Willadean CarolJoseph Yurich, MD   albuterol sulfate HFA 108 (90 BASE) MCG/ACT inhaler Inhale 2 puffs into the lungs every 6 hours as needed for Wheezing (last took this am)    Historical Provider, MD   omeprazole (PRILOSEC) 40 MG delayed release capsule Take 1  capsule by mouth daily 02/28/15   Erling Conte, MD   MULTIPLE VITAMIN PO Take by mouth daily    Historical Provider, MD   buPROPion [facility] SR) 150 MG extended release tablet Take 150 mg by mouth nightly    Historical Provider, MD   donepezil (ARICEPT) 10 MG tablet Take 10 mg by mouth nightly    Historical Provider, MD   omeprazole (PRILOSEC) 20 MG delayed release capsule Take 2 capsules by mouth 2 times daily for 14 days 01/19/15 02/02/15  Delories Heinz, MD   simvastatin (ZOCOR) 20 MG tablet Take 20 mg by mouth nightly.      Historical Provider, MD   levothyroxine (SYNTHROID) 50 MCG tablet Take 50 mcg by mouth daily.       Historical Provider, MD   citalopram (CELEXA) 20 MG tablet Take 20 mg by mouth daily Instructed to take with sip water am of procedure    Historical Provider, MD   montelukast (SINGULAIR) 10 MG tablet Take 10 mg by mouth nightly.      Historical Provider, MD   raloxifene (EVISTA) 60 MG tablet Take 60 mg by mouth daily.      Historical Provider, MD   pregabalin (LYRICA) 150 MG capsule Take 150 mg by mouth 2 times daily Instructed to take with sip water am of procedure    Historical Provider, MD   digoxin (LANOXIN) 0.125 MG tablet Take 125 mcg by mouth every 48 hours Takes at night every other day    Historical Provider, MD   dabigatran (PRADAXA) 150 MG capsule Take 150 mg by mouth 2 times daily.      Historical Provider, MD   ipratropium-albuterol (DUONEB) 0.5-2.5 (3) MG/3ML SOLN nebulizer solution Inhale 1 vial into the lungs every 4 hours Uses prn / instructed if needs to use dos    Historical Provider, MD       Current Medications:    Current Facility-Administered Medications: potassium chloride (KLOR-CON M) extended release tablet 40 mEq, 40 mEq, Oral, Once    Allergies:  Aspirin; Chicken allergy; Eggs or egg-derived products; and Sulfa antibiotics    Social History:    Lifetime nonsmoker.  Denies alcohol and illicit drug use.  Patient lives with her daughter and uses a walker outside of the home for stability.   Drinks 1-2 cups of coffee per day.     Family History: Noncontributory     REVIEW OF SYSTEMS:     ?? Constitutional: + fatigue. Denies fevers. +chills or night sweats  ?? Eyes: Denies visual changes or drainage  ?? ENT: Denies headaches or hearing loss. No mouth sores or sore throat. No epistaxis   ?? Cardiovascular: Denies chest pain, pressure or palpitations. No lower extremity swelling.   ?? Respiratory: Denies DOE, cough, orthopnea or PND. No hemoptysis   ?? Gastrointestinal: Denies hematemesis or anorexia. No hematochezia or melena    ?? Genitourinary: Denies urgency, dysuria or hematuria. +Urinary  frequency   ?? Musculoskeletal: +gait disturbance (uses a walker outside of the house), +  Generalized weakness. Denies  joint complaints  ?? Integumentary: Denies rash, hives or pruritis   ?? Neurological: Denies dizziness, headaches or seizures. No numbness or tingling  ?? Psychiatric: Denies anxiety or depression.  ?? Endocrine: Denies temperature intolerance. No recent weight change. .  ?? Hematologic/Lymphatic: Denies abnormal bruising or bleeding. No swollen lymph nodes    PHYSICAL EXAM:   BP (!) 117/52    Pulse 51    Temp 98 ??F (  36.7 ??C) (Axillary)    Resp 18    Ht 5\' 5"  (1.651 m)    Wt 150 lb (68 kg)    SpO2 98%    BMI 24.96 kg/m??   CONST:  Well developed, well nourished elderly female who appears of stated age. Awake, alert and cooperative. No apparent distress.   HEENT: Poor oral health.   Head- Normocephalic, atraumatic   Eyes- Conjunctivae pink, anicteric  Throat- Oral mucosa pink and moist  Neck-  No stridor, trachea midline, no jugular venous distention. No carotid bruit.    CHEST: Chest symmetrical and non-tender to palpation. No accessory muscle use or intercostal retractions  RESPIRATORY: Lung sounds - Diminished in lower bases.   CARDIOVASCULAR:     Heart Inspection- shows no noted pulsations  Heart Palpation- no heaves or thrills; PMI is non-displaced   Heart Ausculation- Regular rate and rhythm, no murmur. No s3, s4 or rub   PV: No lower extremity edema. No varicosities. Pedal pulses palpable, no clubbing or cyanosis   ABDOMEN: Soft, non-tender to light palpation. Bowel sounds present. No palpable masses no organomegaly; no abdominal bruit  MS: Good muscle strength and tone. No atrophy or abnormal movements.   GU: Deferred  SKIN: Warm and dry no statis dermatitis or ulcers   NEURO / PSYCH: Oriented to person only. Speech mumbled and appropriate. Follows all commands. Pleasant affect     DATA:    ECG / Tele strips: Afib   ECG: Afib with SVR, low QRS voltage, nonspecific ST-T wave changes; QT/QTc 474/444  ms; rate 53.     Diagnostic:    CXR: 04/03/2016  Cardiomegaly with pulmonary edema and a small right pleural effusion.    CT Head: 04/03/2016  No acute intracranial findings.   ??   Chronic microvascular ischemic changes and parenchymal volume loss.   ??           Labs:   CBC:   Recent Labs      04/03/16   0930   WBC  9.1   HGB  11.8   HCT  36.4   PLT  159     BMP:   Recent Labs      04/03/16   1015   NA  138   K  3.2*   CO2  29   BUN  18   CREATININE  0.9   LABGLOM  59   CALCIUM  8.2*     PT/INR:   Recent Labs      04/03/16   0930   PROTIME  22.6*   INR  2.0     APTT:  Recent Labs      04/03/16   0930   APTT  40.4*     CARDIAC ENZYMES:  Recent Labs      04/03/16   1015   TROPONINI  0.03     LIVER PROFILE:  Recent Labs      04/03/16   1015   AST  114*   ALT  42*   LABALBU  2.3*       Electronically signed by Rosalita LevanAmanda E Kotheimer, CNP on 04/03/16 at 1:00 PM    Assessment and Plan per Dr. Lidia CollumKassis-George.       ??  I have reviewed the notes, assessments, and/or procedures performed by Georgian CoAmanda Kotheimer, NP, I examined and interviewed the patient and the patient's family, and I concur with her/his documentation of Gay FillerRuth E Notarianni.  ??  ??  Cardiac arrest  VT, monomorphic on the rhythm strips we have  NSTEMI likely type 2 demand versus type 1 without any preceding symptoms  Volume depleted  Dementia  Known CAD  ??  ??  ??  IVF  Stat TTE  Heparin gtt 48 hours  Aspirin  Hold NOAC for now  BB  ACE I  ??  Discussed ischemia assessment at this time, but suspicion of QT prolonging brady induced VT, as discussed with EP. While ischemia can certainly induced bradycardia, will observe for now while obtaining stat TTE and determining overall prognosis given AMS, dementia and inability to swallow anything (i.e. If LHC reveals anything requiring oral meds, and especially DAPT, then she is unable to take anything po at this time for unclear reasons. Awaiting GI consult).   ??  Liam Rogers, M.D.  Advanced Heart Failure and Pulmonary  Hypertension  [place] [phone]  ??

## 2016-04-03 NOTE — ED Notes (Signed)
Bed clean on awarix, verified good telepack reading with Henriette CombsAmanda RN. Pt placed into chimes.      Michelle ProseHaleigh E Alarik Radu, RN  04/03/16 [phone]

## 2016-04-03 NOTE — Progress Notes (Signed)
Michelle FillerRuth E Stevison was ordered evista ,  which is a nonformulary medication.  The patient has indicated that the home supply of this medication will be brought in to the hospital for inpatient use.      If the medication has not been administered by 1400 on the following day from the time the order was placed, a pharmacist will follow-up with the nurse of the patient to assess the capability of the patient to bring in the medication.    If it is determined that the patient cannot supply the medication and it is not available to be dispensed from the pharmacy, a call will be placed to the ordering provider to discuss alternative options.

## 2016-04-04 LAB — BASIC METABOLIC PANEL
Anion Gap: 12 mmol/L (ref 7–16)
Anion Gap: 13 mmol/L (ref 7–16)
BUN: 17 mg/dL (ref 8–23)
BUN: 18 mg/dL (ref 8–23)
CO2: 26 mmol/L (ref 22–29)
CO2: 29 mmol/L (ref 22–29)
Calcium: 7.3 mg/dL — ABNORMAL LOW (ref 8.6–10.2)
Calcium: 7.8 mg/dL — ABNORMAL LOW (ref 8.6–10.2)
Chloride: 101 mmol/L (ref 98–107)
Chloride: 101 mmol/L (ref 98–107)
Creatinine: 0.8 mg/dL (ref 0.5–1.0)
Creatinine: 0.9 mg/dL (ref 0.5–1.0)
GFR African American: >60
GFR African American: >60
GFR Non-African American: >60 mL/min/{1.73_m2} (ref >=60)
GFR Non-African American: 59 mL/min/{1.73_m2} (ref >=60)
Glucose: 73 mg/dL — ABNORMAL LOW (ref 74–109)
Glucose: 80 mg/dL (ref 74–109)
Potassium: 6 mmol/L — ABNORMAL HIGH (ref 3.5–5.0)
Potassium: 3.3 mmol/L — ABNORMAL LOW (ref 3.5–5.0)
Sodium: 139 mmol/L (ref 132–146)
Sodium: 143 mmol/L (ref 132–146)

## 2016-04-04 LAB — CBC
Hematocrit: 35.6 % (ref 34.0–48.0)
Hemoglobin: 11.4 g/dL — ABNORMAL LOW (ref 11.5–15.5)
MCH: 29.5 pg (ref 26.0–35.0)
MCHC: 32 % (ref 32.0–34.5)
MCV: 92 fL (ref 80.0–99.9)
MPV: 10.8 fL (ref 7.0–12.0)
Platelets: 156 E9/L (ref 130–450)
RBC: 3.87 E12/L (ref 3.50–5.50)
RDW: 16.4 fL — ABNORMAL HIGH (ref 11.5–15.0)
WBC: 11.6 E9/L — ABNORMAL HIGH (ref 4.5–11.5)

## 2016-04-04 LAB — ECHOCARDIOGRAM COMPLETE 2D W DOPPLER W COLOR: Left Ventricular Ejection Fraction: 65

## 2016-04-04 LAB — APTT
aPTT: 174.9 s — ABNORMAL HIGH (ref 24.5–35.1)
aPTT: >240 s — ABNORMAL HIGH (ref 24.5–35.1)
aPTT: 84 s — ABNORMAL HIGH (ref 24.5–35.1)

## 2016-04-04 LAB — POTASSIUM: Potassium: 4.9 mmol/L (ref 3.5–5.0)

## 2016-04-04 LAB — LACTIC ACID: Lactic Acid: 2.6 mmol/L — ABNORMAL HIGH (ref 0.5–2.2)

## 2016-04-04 LAB — MAGNESIUM: Magnesium: 2.4 mg/dL (ref 1.6–2.6)

## 2016-04-04 LAB — TSH: TSH: 2.9 u[IU]/mL (ref 0.270–4.200)

## 2016-04-04 MED ORDER — PANTOPRAZOLE SODIUM 40 MG IV SOLR
40 MG | Freq: Every day | INTRAVENOUS | Status: DC
Start: 2016-04-04 — End: 2016-04-04

## 2016-04-04 MED ORDER — POTASSIUM CHLORIDE 10 MEQ/100ML IV SOLN
10 MEQ/0ML | INTRAVENOUS | Status: AC
Start: 2016-04-04 — End: 2016-04-04
  Administered 2016-04-04 (×4): 10 meq via INTRAVENOUS

## 2016-04-04 MED ORDER — KCL IN DEXTROSE-NACL 20-5-0.45 MEQ/L-%-% IV SOLN
INTRAVENOUS | Status: DC
Start: 2016-04-04 — End: 2016-04-11
  Administered 2016-04-04 – 2016-04-11 (×15): via INTRAVENOUS

## 2016-04-04 MED ORDER — POTASSIUM CHLORIDE CRYS ER 20 MEQ PO TBCR
20 MEQ | Freq: Once | ORAL | Status: DC
Start: 2016-04-04 — End: 2016-04-04

## 2016-04-04 MED ORDER — POTASSIUM CHLORIDE 2 MEQ/ML IV SOLN
2 MEQ/ML | INTRAVENOUS | Status: DC
Start: 2016-04-04 — End: 2016-04-04

## 2016-04-04 MED FILL — POTASSIUM CHLORIDE 10 MEQ/100ML IV SOLN: 10 MEQ/0ML | INTRAVENOUS | Qty: 100

## 2016-04-04 MED FILL — KCL IN DEXTROSE-NACL 20-5-0.45 MEQ/L-%-% IV SOLN: INTRAVENOUS | Qty: 1000

## 2016-04-04 MED FILL — NORMAL SALINE FLUSH 0.9 % IV SOLN: 0.9 % | INTRAVENOUS | Qty: 40

## 2016-04-04 MED FILL — ISUPREL 0.2 MG/ML IJ SOLN: 0.2 MG/ML | INTRAMUSCULAR | Qty: 5

## 2016-04-04 MED FILL — DEXTROSE-NACL 5-0.45 % IV SOLN: INTRAVENOUS | Qty: 1000

## 2016-04-04 MED FILL — NORMAL SALINE FLUSH 0.9 % IV SOLN: 0.9 % | INTRAVENOUS | Qty: 30

## 2016-04-04 MED FILL — EPINEPHRINE PF 1 MG/10ML IJ SOSY: 1 MG/0ML | INTRAMUSCULAR | Qty: 10

## 2016-04-04 MED FILL — LIDOCAINE HCL (CARDIAC) 20 MG/ML IV SOLN: 20 MG/ML | INTRAVENOUS | Qty: 5

## 2016-04-04 NOTE — H&P (Signed)
GI: reviewed recent gastrograffin studies after perforation last month and rather good views of the esophagus were OK . However, the swallowing issue is acute over several days prior to admission and EGD in 2016 revealed a ring quite capable of causing esophageal impaction. Spoke with daughter [name] and generally does not eat anything that would cause impaction primarily because of poor dentition. Still her hx and observations by speech pathologist make esophageal food impaction on top of the ring most likely. Believe she needs EGD as soon as cardiology will allow.    LFTs not typical of injury pattern after arrest but suspect that the case. Await repeat.

## 2016-04-04 NOTE — Procedures (Signed)
Echocardiogram completed by KM.

## 2016-04-04 NOTE — Consults (Addendum)
Critical Care Cardiac Electrophysiology Consultation Note    [name]  1927-06-08  Date of Service: 04/04/2016  PCP: Otilio Saber, DO  Cardiologist: Warnell Forester, MD  Electrophysiologist: Pete Pelt, DO    Reason for Consultation: Recurrent PMVT/cardiac arrest.    SUBJECTIVE: [name] is a 82 yo female who I was asked to see in Cardiac Electrophysiology consultation for recurrent PMVT/OOH cardiac arrest.  The majority of the history is taken from the chart given that the patient has advanced dementia.  There is no family present this AM.  [name] has a history of dementia, long-standing persistent/permanent AF on Pradaxa, COPD,  HLD, hypothyroidism and depression.   It was reported that she was in her usual state of health at her facility yesterday when she developed periods of near syncopal like symptoms as well as further change in mental status.  EMS was called to her facility and placed on the cardiac monitor.    While checking her blood glucose, apparently she went into a wide complex tachycardia which required defibrillation.  She was brought to the emergency room and was noted to be in atrial fibrillation with a controlled ventricular response.  She remained in atrial fibrillation during her stay in the emergency room.  It was noted that her potassium was diminished at 3.2.    Her lactic acid was a little elevated at 2.5 and her initial troponin was 0.03.  In review of her EKG, there was noted diffuse T-wave abnormalities noted in the anterior leads with marked QT prolongation of the QTc interval of approximately 590-600 ms.   She was transferred to telemetry and on telemetry continued to have nonsustained episodes of polymorphic ventricular tachycardia followed by a sustained episode requiring repeat defibrillation.  In review of her rhythm strips,  It appears that the episodes of PMVT were preceded by a pause with a noted fusion beat/PVC on the T-wave.  2 gms of IV magnesium  were given and isuprel was started at 2 mg.  K+ continued to be repleted.  She was seen in consultation by Cardiology with no plans for aggressive intervention.  Overnight, she remains in AF/Fl at 90 bpm.  There have bee no further episodes of PMVT and her K+ remains decreased as noted below.  In review of her ECG today, she continues to have marked T-wave inversion in the anterolateral leads and her QTc remains prolonged at ~ 580-590 ms.  As an OP she was noted to be on Aricept, Celexa and prilosec.  All of which together and in combination with hypokalemia can lead to QT prolongation.    Patient Active Problem List    Diagnosis Date Noted   ??? NSTEMI (non-ST elevated myocardial infarction) (HCC) 04/04/2016   ??? CAD (coronary artery disease), native coronary artery 04/04/2016   ??? Hyperlipidemia LDL goal <100 04/04/2016   ??? Paroxysmal atrial fibrillation (HCC) 04/04/2016   ??? PUD (peptic ulcer disease) 04/04/2016   ??? V-tach (HCC) 04/03/2016   ??? COPD (chronic obstructive pulmonary disease) (HCC) 04/03/2016   ??? Moderate protein-calorie malnutrition (HCC) 02/24/2016       Past Medical History:   Diagnosis Date   ??? A-fib (HCC)    ??? Anticoagulated     on pradaxa   ??? Asthma    ??? COPD (chronic obstructive pulmonary disease) (HCC)    ??? Depression    ??? Forgetfulness     capable of signing own consents   ??? GERD (gastroesophageal reflux disease)    ???  HOH (hard of hearing)     no aides   ??? Hyperlipidemia    ??? Pain     bilateral upper quadrant /  for EGD   ??? Restless leg syndrome    ??? Thyroid disease        Past Surgical History:   Procedure Laterality Date   ??? ABDOMEN SURGERY  02/19/2016    laprascopic with graham patch of gastric ulcer   ??? BACK SURGERY  2010?    lumbar    ??? BREAST SURGERY      Lumpectomy   ??? ENDOSCOPY, COLON, DIAGNOSTIC  02/28/2015   ??? HYSTERECTOMY  1970's   ??? JOINT REPLACEMENT Bilateral 2011?    knee   ??? VARICOSE VEIN SURGERY Bilateral 2012       History reviewed. No pertinent family history.    Social History    Substance Use Topics   ??? Smoking status: Never Smoker   ??? Smokeless tobacco: Never Used   ??? Alcohol use No       Current Facility-Administered Medications   Medication Dose Route Frequency Provider Last Rate Last Dose   ??? potassium chloride 10 mEq/100 mL IVPB (Peripheral Line)  10 mEq Intravenous Q1H Pete Pelt, DO 100 mL/hr at 04/04/16 0812 10 mEq at 04/04/16 7829   ??? sodium chloride flush 0.9 % injection 10 mL  10 mL Intravenous 2 times per day Encarnacion Chu, MD   10 mL at 04/03/16 2051   ??? sodium chloride flush 0.9 % injection 10 mL  10 mL Intravenous PRN Encarnacion Chu, MD       ??? trimethobenzamide Dorothey Baseman) injection 200 mg  200 mg Intramuscular Q6H PRN Encarnacion Chu, MD       ??? ipratropium-albuterol (DUONEB) nebulizer solution 1 ampule  1 ampule Inhalation Q4H PRN Encarnacion Chu, MD       ??? 0.9 % sodium chloride infusion   Intravenous Continuous Encarnacion Chu, MD 75 mL/hr at 04/04/16 [phone]     ??? heparin (porcine) injection 4,000 Units  4,000 Units Intravenous PRN Warnell Forester, MD       ??? heparin (porcine) injection 2,000 Units  2,000 Units Intravenous PRN Warnell Forester, MD       ??? heparin 25,000 units in dextrose 5% 250 mL infusion  12 Units/kg/hr Intravenous Continuous Hayah Kassis-George, MD 6.4 mL/hr at 04/04/16 0145 9 Units/kg/hr at 04/04/16 0145   ??? acetaminophen (TYLENOL) suppository 650 mg  650 mg Rectal Q4H PRN Encarnacion Chu, MD       ??? isoproterenol (ISUPREL) 1 mg in dextrose 5 % 250 mL infusion  2 mcg/min Intravenous Continuous Pete Pelt, DO 15 mL/hr at 04/03/16 1820 1 mcg/min at 04/03/16 1820        Allergies   Allergen Reactions   ??? Aspirin      Reaction unknown   ??? Chicken Allergy      Allergic to poultry (eggs, chicken, [place])   ??? Eggs Or Egg-Derived Products Nausea And Vomiting   ??? Sulfa Antibiotics Nausea And Vomiting       ROS:   Unable to obtain 2/2 baseline dementia.    PHYSICAL EXAM:  Vitals:    04/04/16 0600 04/04/16 0700 04/04/16 0800 04/04/16 0900   BP: (!)  143/60 129/67 (!) 140/62 132/64   Pulse: 96 97 83 96   Resp: 21 27 21 20    Temp:   98.2 ??F (36.8 ??C)    TempSrc:   Temporal  SpO2: 93% 91% 98% 93%   Weight:       Height:         Head: Normocephalic and atraumatic.   Eyes: Conjunctivae are normal. Pupils are equal, round, and reactive to light.   Neck: No hepatojugular reflux and no JVD present. Carotid bruit is not present.   Cardiovascular: S1 normal, S2 normal and intact distal pulses. An irregular rhythm present. PMI is not displaced.   Pulmonary/Chest: Effort normal and breath sounds normal. No respiratory distress.   Abdominal: Soft. Normal appearance and bowel sounds are normal.  No abdominal bruit and no pulsatile midline mass. There is no hepatomegaly. No tenderness.   Musculoskeletal: Normal range of motion of all extremities, no muscle weakness.  Neurological: Alert and oriented to person, place, and time.   Skin: Skin is warm and dry. No bruising, no ecchymosis and no rash noted.   Extremity: No clubbing or cyanosis.  No edema.    Pertinent Labs:  CBC:   Recent Labs      04/03/16   0930  04/04/16   0333   WBC  9.1  11.6*   HGB  11.8  11.4*   HCT  36.4  35.6   PLT  159  156     BMP:  Recent Labs      04/03/16   1015  04/04/16   0333  04/04/16   0425   NA  138  139  143   K  3.2*  6.0*  3.3*   CL  97*  101  101   CO2  29  26  29    BUN  18  17  18    CREATININE  0.9  0.8  0.9   CALCIUM  8.2*  7.3*  7.8*     ABGs: No results found for: PHART, PO2ART, PCO2ART  INR:   Recent Labs      04/03/16   0930   INR  2.0     BNP: No results for input(s): BNP in the last 72 hours.   TSH:   Lab Results   Component Value Date    TSH 2.900 04/03/2016      Cardiac Injury Profile:   Recent Labs      04/03/16   1531   TROPONINI  0.04*      Lipid Profile: No results found for: TRIG, HDL, LDLCALC, CHOL   Hemoglobin A1C: No components found for: HGBA1C   Xray:   CT Head WO Contrast   Final Result      No acute intracranial findings.      Chronic microvascular ischemic changes  and parenchymal volume loss.            XR Chest Portable   Final Result   Cardiomegaly with pulmonary edema and a small right pleural effusion.        Pertinent Cardiac Testing:     ECG 04/03/16: AF with a SVR, NAD, low voltage, old AWMI, QTc: 600 ms    ECG 04/04/16: Atypical AFl at 98 bpm, NAD, low voltage, old AWMI, T-wave abn anterolateral leads, QTc: 580-590 ms    Telemetry12/01/2016: AF/Fl at 90-100 bpm, no recurrent PMVT.    Impression:    1. PMVT arrest  - most likely 2/2 prolonged QT interval and hypokalemia  - currently on Isuprel at 2 mg  - no recurrent PMVT on isuprel  - K+ being replaced  - Holding all QT prolonging medications: Aricept, celexa, prilosec, zofran  -  echo pending    2. Prolonged QTc  - 2/2 Aricept, celexa, prilosec in combination with hypokalemia  - current QTc: 580-590 ms    3. Long-standing persistent/permanent AF  - current in AF/Fl   - was on Pradaxa as an OP.  - would use Eliquis 5 mg BID at present--however needs swallow evaluation prior to starting it  - currently on IV heparin    4. Hypokalemia  - continue replacement  - recheck K+ later today.    5. Dementia  - on Aricept as an OP    6. HLD  - Zocor    7. Hypothyroidism  - on synthroid replacement    8. Depression  - on celexa as an OP    9. COPD      Recommendations:    1. Continue Isuprel at 2 mg for now until hypokalemia is resolved and QTc is improved.  2. Continue to hold aricept, celexa, and prilosec.  Also avoid all QT prolonging medications.  3. Once she is able to take po, would recommend changing Pradaxa to Eliquis 5 mg BID.  4. 2D-echocardiogram is pending.  Her ECG is abnormal but I believe there will be no aggressive intervention from a cardiology perspective.  5. Daily ECG's.  6. Daily BMP, Mg.  7. She remains a Full code at present.    8. Will follow with you.    Thank you for allowing me to participate in your patient's care.    Pete PeltAllan L. Thoren Hosang, DO  [facility]  [facility]  [facility]    Critical care time: 80 minutes

## 2016-04-04 NOTE — Consults (Signed)
[facility]                   [address] [place], [place] [id number]                                   CONSULTATION    PATIENT NAME: [name], [name]                     DOB:        February 28, 1928  MED REC NO:   [id number]                            ROOM:       3819  ACCOUNT NO:   [id number]                          ADMIT DATE: 04/03/2016  PROVIDER:     Atilano Inaichard Lunah Losasso, MD    CONSULT DATE:  04/04/2016    REASON FOR CONSULTATION:  Dysphagia.    HISTORY OF PRESENT ILLNESS:  This is an 82 year old woman, who was brought  to the emergency room after changes in mental status at home and recognized  to be in ventricular tachycardia by EMS where she was cardioverted.  She  was placed on a monitored unit, but issues recurred.  She is now in CVIC.    She was noted at the time of admission to have issues with swallowing.   Speech pathology identified regurgitation of clear fluid with any challenge  with small amounts of liquid in the form of ice cubes or teaspoons of  water.  She says that she has been unable to keep anything down for 3 or 4  days and had visited her primary care provider on Friday with similar  complaints with suggestion perhaps that it was a viral infection.  She  reports no problems prior to 3 or 4 days ago.    In 01/2016, she had a perforated gastric ulcer that was repaired  laparoscopically.  Postoperatively, she had a Gastrografin study with a  good view of the esophagus, which does not reveal any significant hiatal  hernia or obvious stricture or ring.  However, an endoscopic evaluation is  available from 01/2015, which reveals a Schatzki's ring.  There was a small  hiatal hernia endoscopically, but there was no esophagitis.    I have spoken with her daughter, Michelle RegalCarol, by phone.  Because of dentition  issues, she is generally on a soft diet.  There does not seem to have been  any meat ingested.  The only thing of solid nature was perhaps pizza  several days  prior to admission.  She denies presence of chest pain.  She  has not been noted to regurgitate gastric contents since admission to the  medical intensive care unit.    PAST MEDICAL HISTORY:  Remarkable for heart disease.  She has a history of  atrial fibrillation and chronically anticoagulated.  She has history of  COPD, hyperlipidemia, hypothyroidism, bilateral knee replacements,  hysterectomy, lumbar surgery  and questionable dementia.    HOME MEDICATIONS:  Included Zofran, albuterol, omeprazole, Wellbutrin,  Aricept, simvastatin, thyroid replacement, Celexa, Singulair, Evista and  Lanoxin.  She is also on Pradaxa and DuoNeb.    ALLERGIES:  Listed to ASPIRIN and EGGS as  well as SULFA ANTIBIOTICS.    SOCIAL HISTORY:  She lives with her daughter in town.  She is a life-long  nonsmoker and does not use alcohol.    PHYSICAL EXAMINATION:  GENERAL:  She is alert.  She is oriented and actually can relate pretty  good history.  She is normotensive.  VITAL SIGNS:  She has a BMI of 25.  Saturation is 98%.  HEENT:  Dentition is in poor repair.  NECK:  Veins are flat.  No adenopathy or crepitation.  CHEST:  Breath sounds diminished.  CARDIOVASCULAR:  Rhythm is regular currently.  ABDOMEN:  She has a benign abdomen.  No tenderness.  She is not edematous.    Chest x-ray showed cardiomegaly with suggestion of pulmonary vascular  congestion and small right pleural effusion.    LABORATORY DATA:  White count 11.6 and hematocrit 35.  BUN 18 and  creatinine 0.9.  She has new liver function abnormalities.  Alkaline  phosphatase 215, ALT 42, AST 114 with a bilirubin of 3.5.  Lanoxin level is  1.2.    ASSESSMENT:  The dysphagia sounds like an esophageal foreign body.   Daughter suggests she is always on a soft diet at home with few meals  containing any type of  meat.  Still the history with a  known ring, which actually seems reasonably tight endoscopically and  inability to swallow anything for 4 days an esophageal foreign body rises  to  the top of the list of possibilities.  An endoscopic evaluation soon is  appropriate.  If Cardiology agrees, she is a candidate for doing so.    She has  abnormal liver function studies.  This is status post arrest  yesterday morning.  Numbers today are uncertain.  They will be repeated.   Suspect the numbers though not obviously typical of ischemic liver injury  at this point, represent the events that brought her to the hospital.        Atilano InaICHARD Rashee Marschall, MD    D: 04/04/2016 10:30:03       T: 04/04/2016 12:26:23     [email or web address]  Job#: [id number]     Doc#: [id number]    CC:

## 2016-04-04 NOTE — Plan of Care (Signed)
Problem: Falls - Risk of  Goal: Absence of falls  Outcome: Met This Shift      Problem: Risk for Impaired Skin Integrity  Goal: Tissue integrity - skin and mucous membranes  Structural intactness and normal physiological function of skin and  mucous membranes.   Outcome: Met This Shift

## 2016-04-04 NOTE — Progress Notes (Signed)
Spoke with Dr. Myrtis SerKatz regarding Dr. Ancil LinseyMarina's request for cardiac clearance for EGD. Dr. Myrtis SerKatz stated from his perspective not likely until Tuesday.

## 2016-04-04 NOTE — Plan of Care (Signed)
Problem: Falls - Risk of  Goal: Absence of falls  Outcome: Met This Shift

## 2016-04-04 NOTE — H&P (Signed)
[facility]  Attending History and Physical      CHIEF COMPLAINT:  Altered mental status      HISTORY OF PRESENT ILLNESS:      The patient is a 82 y.o. female patient of dr Veryl Speak who presents with complains of altered mental status.  Patient was found by family altered and barely responsive.  EMS were called.  The patient was being evaluated.  She was noted to suffer VT on monitoring.  She was shocked.  She was taken to the ED.  He had more VT here in the hospital.  She was defibrillated again.  CPR was performed.  She was placed in CVIC.  She is resting comfortably in bed.  She is very confused.  She is a poor historian.  She denies chest pain, shortness of breath, abdominal pain, nausea, vomiting, fevers, chills and diaphoresis.          Past Medical History:    Past Medical History:   Diagnosis Date   ??? A-fib (HCC)    ??? Anticoagulated     on pradaxa   ??? Asthma    ??? COPD (chronic obstructive pulmonary disease) (HCC)    ??? Depression    ??? Forgetfulness     capable of signing own consents   ??? GERD (gastroesophageal reflux disease)    ??? HOH (hard of hearing)     no aides   ??? Hyperlipidemia    ??? Pain     bilateral upper quadrant /  for EGD   ??? Restless leg syndrome    ??? Thyroid disease        Past Surgical History:    Past Surgical History:   Procedure Laterality Date   ??? ABDOMEN SURGERY  02/19/2016    laprascopic with graham patch of gastric ulcer   ??? BACK SURGERY  2010?    lumbar    ??? BREAST SURGERY      Lumpectomy   ??? ENDOSCOPY, COLON, DIAGNOSTIC  02/28/2015   ??? HYSTERECTOMY  1970's   ??? JOINT REPLACEMENT Bilateral 2011?    knee   ??? VARICOSE VEIN SURGERY Bilateral 2012       Medications Prior to Admission:    Prescriptions Prior to Admission: ondansetron (ZOFRAN) 4 MG tablet, Take 1 tablet by mouth every 8 hours as needed for Nausea or Vomiting  omeprazole (PRILOSEC) 40 MG delayed release capsule, Take 1 capsule by mouth daily  MULTIPLE VITAMIN PO, Take by mouth daily  buPROPion  (WELLBUTRIN SR) 150 MG extended release tablet, Take 150 mg by mouth nightly  donepezil (ARICEPT) 10 MG tablet, Take 10 mg by mouth nightly  simvastatin (ZOCOR) 20 MG tablet, Take 20 mg by mouth nightly.    levothyroxine (SYNTHROID) 50 MCG tablet, Take 50 mcg by mouth daily.    citalopram (CELEXA) 20 MG tablet, Take 20 mg by mouth daily Instructed to take with sip water am of procedure  montelukast (SINGULAIR) 10 MG tablet, Take 10 mg by mouth nightly.    raloxifene (EVISTA) 60 MG tablet, Take 60 mg by mouth daily.    pregabalin (LYRICA) 150 MG capsule, Take 150 mg by mouth 2 times daily Instructed to take with sip water am of procedure  digoxin (LANOXIN) 0.125 MG tablet, Take 125 mcg by mouth every 48 hours Takes at night every other day  dabigatran (PRADAXA) 150 MG capsule, Take 150 mg by mouth 2 times daily.    albuterol sulfate HFA 108 (90 BASE) MCG/ACT inhaler,  Inhale 2 puffs into the lungs every 6 hours as needed for Wheezing (last took this am)  omeprazole (PRILOSEC) 20 MG delayed release capsule, Take 2 capsules by mouth 2 times daily for 14 days  ipratropium-albuterol (DUONEB) 0.5-2.5 (3) MG/3ML SOLN nebulizer solution, Inhale 1 vial into the lungs every 4 hours Uses prn / instructed if needs to use dos    Allergies:    Aspirin; Chicken allergy; Eggs or egg-derived products; and Sulfa antibiotics    Social History:    reports that she has never smoked. She has never used smokeless tobacco. She reports that she does not drink alcohol or use drugs.    Family History:   family history is not on file.    REVIEW OF SYSTEMS:  As above in the HPI, otherwise negative    PHYSICAL EXAM:    Vitals:  BP 132/64    Pulse 96    Temp 98.2 ??F (36.8 ??C) (Temporal)    Resp 20    Ht 5\' 5"  (1.651 m)    Wt 157 lb 3.2 oz (71.3 kg)    SpO2 93%    BMI 26.16 kg/m??     General:  Awake, alert, oriented to self.  Frail appearing.  HEENT:  Normocephalic, atraumatic.  Pupils equal, round, reactive to light.  No scleral icterus.  No  conjunctival injection.  Normal lips, teeth, and gums.  No nasal discharge.  Neck:  Supple  Heart:  RRR, no murmurs, gallops, rubs  Lungs:  CTA bilaterally, bilat symmetrical expansion, no wheeze, rales, or rhonchi  Abdomen:  Bowel sounds present, soft, nontender, no masses, no organomegaly, no peritoneal signs  Extremities:  No clubbing, cyanosis, or edema  Skin:  Warm and dry, no open lesions or rash  Neuro:  Cranial nerves 2-12 intact, no focal deficits.  General physical deconditioning.  Moderate cognitive impairment.    Breast: deferred  Rectal: deferred  Genitalia:  deferred    LABS:    CBC with Differential:    Lab Results   Component Value Date    WBC 11.6 04/04/2016    RBC 3.87 04/04/2016    HGB 11.4 04/04/2016    HCT 35.6 04/04/2016    PLT 156 04/04/2016    MCV 92.0 04/04/2016    MCH 29.5 04/04/2016    MCHC 32.0 04/04/2016    RDW 16.4 04/04/2016    LYMPHOPCT 28.8 04/03/2016    MONOPCT 8.3 04/03/2016    BASOPCT 0.3 04/03/2016    MONOSABS 0.75 04/03/2016    LYMPHSABS 2.62 04/03/2016    EOSABS 0.02 04/03/2016    BASOSABS 0.03 04/03/2016     CMP:    Lab Results   Component Value Date    NA 143 04/04/2016    K 3.3 04/04/2016    CL 101 04/04/2016    CO2 29 04/04/2016    BUN 18 04/04/2016    CREATININE 0.9 04/04/2016    GFRAA >60 04/04/2016    LABGLOM 59 04/04/2016    GLUCOSE 80 04/04/2016    PROT 5.0 04/03/2016    LABALBU 2.3 04/03/2016    CALCIUM 7.8 04/04/2016    BILITOT 3.5 04/03/2016    ALKPHOS 215 04/03/2016    AST 114 04/03/2016    ALT 42 04/03/2016     BMP:    Lab Results   Component Value Date    NA 143 04/04/2016    K 3.3 04/04/2016    CL 101 04/04/2016    CO2 29 04/04/2016  BUN 18 04/04/2016    LABALBU 2.3 04/03/2016    CREATININE 0.9 04/04/2016    CALCIUM 7.8 04/04/2016    GFRAA >60 04/04/2016    LABGLOM 59 04/04/2016    GLUCOSE 80 04/04/2016     Magnesium:    Lab Results   Component Value Date    MG 2.4 04/04/2016     Phosphorus:    Lab Results   Component Value Date    PHOS 2.6 02/23/2016      PT/INR:    Lab Results   Component Value Date    PROTIME 22.6 04/03/2016    INR 2.0 04/03/2016     PTT:    Lab Results   Component Value Date    APTT >240.0 04/04/2016   [APTT}  Troponin:    Lab Results   Component Value Date    TROPONINI 0.04 04/03/2016     Last 3 Troponin:    Lab Results   Component Value Date    TROPONINI 0.04 04/03/2016    TROPONINI 0.03 04/03/2016    TROPONINI <0.01 02/19/2016     U/A:    Lab Results   Component Value Date    COLORU AMBER 02/19/2016    PROTEINU TRACE 02/19/2016    PHUR 5.5 02/19/2016    WBCUA 10-20 02/19/2016    RBCUA 1-3 02/19/2016    MUCUS Present 02/19/2016    BACTERIA MANY 02/19/2016    CLARITYU SL CLOUDY 02/19/2016    SPECGRAV >=1.030 02/19/2016    LEUKOCYTESUR Negative 02/19/2016    UROBILINOGEN 2.0 02/19/2016    BILIRUBINUR SMALL 02/19/2016    BLOODU Negative 02/19/2016    GLUCOSEU Negative 02/19/2016     HgBA1c:  No results found for: LABA1C  FLP:  No results found for: TRIG, HDL, LDLCALC, LDLDIRECT, LABVLDL  TSH:    Lab Results   Component Value Date    TSH 2.900 04/03/2016       ASSESSMENT:         V-tach (HCC) (04/03/2016)     Moderate protein-calorie malnutrition (HCC) (02/24/2016)     COPD (chronic obstructive pulmonary disease) (HCC) (04/03/2016)     NSTEMI (non-ST elevated myocardial infarction) (HCC) (04/04/2016)     CAD (coronary artery disease), native coronary artery (04/04/2016)     Hyperlipidemia LDL goal <100 (04/04/2016)       Paroxysmal atrial fibrillation (HCC) (04/04/2016)          PLAN:    On amiodarone drip.  Cardiology and EP consulted.  Follow recommendations.  Probably related to cardiac ischemia versus hypokalemia.  LHC?  Supplement diet.  Continue breathing treatments and supplemental oxygen.  Elevated CE's related to ischemia or defibrillation.  Cardiology on board.  Would not hesitate to cath this patient.  Continue medical management of ASCAD.  Continue aggressive lipid therapy.  Rate controlled.  On chronic anticoagulation.  Pt/Ot  evaluations for discharge planning once more stable.     Minda Dittoobert J Mariany Mackintosh, MD  9:37 AM  04/04/2016

## 2016-04-05 LAB — EKG 12-LEAD
Atrial Rate: 227 {beats}/min
Atrial Rate: 98 {beats}/min
Atrial Rate: 234 {beats}/min
Atrial Rate: 99 {beats}/min
Atrial Rate: 46 {beats}/min
P Axis: 15 degrees
P Axis: 103 degrees
P-R Interval: 286 ms
P-R Interval: 206 ms
Q-T Interval: 354 ms
Q-T Interval: 394 ms
Q-T Interval: 484 ms
Q-T Interval: 306 ms
Q-T Interval: 474 ms
QRS Duration: 70 ms
QRS Duration: 78 ms
QRS Duration: 70 ms
QRS Duration: 66 ms
QRS Duration: 72 ms
QTc Calculation (Bazett): 387 ms
QTc Calculation (Bazett): 503 ms
QTc Calculation (Bazett): 614 ms
QTc Calculation (Bazett): 392 ms
QTc Calculation (Bazett): 444 ms
R Axis: -13 degrees
R Axis: -17 degrees
R Axis: -19 degrees
R Axis: -17 degrees
R Axis: -12 degrees
T Axis: -149 degrees
T Axis: -172 degrees
T Axis: 150 degrees
T Axis: -174 degrees
T Axis: -81 degrees
Ventricular Rate: 72 {beats}/min
Ventricular Rate: 98 {beats}/min
Ventricular Rate: 97 {beats}/min
Ventricular Rate: 99 {beats}/min
Ventricular Rate: 53 {beats}/min

## 2016-04-05 LAB — COMPREHENSIVE METABOLIC PANEL
ALT: 41 U/L — ABNORMAL HIGH (ref 0–32)
AST: 104 U/L — ABNORMAL HIGH (ref 0–31)
Albumin: 2.3 g/dL — ABNORMAL LOW (ref 3.5–5.2)
Alkaline Phosphatase: 206 U/L — ABNORMAL HIGH (ref 35–104)
Anion Gap: 14 mmol/L (ref 7–16)
BUN: 16 mg/dL (ref 8–23)
CO2: 24 mmol/L (ref 22–29)
Calcium: 7.6 mg/dL — ABNORMAL LOW (ref 8.6–10.2)
Chloride: 103 mmol/L (ref 98–107)
Creatinine: 0.7 mg/dL (ref 0.5–1.0)
GFR African American: >60
GFR Non-African American: >60 mL/min/{1.73_m2} (ref >=60)
Glucose: 144 mg/dL — ABNORMAL HIGH (ref 74–109)
Potassium: 4.1 mmol/L (ref 3.5–5.0)
Sodium: 141 mmol/L (ref 132–146)
Total Bilirubin: 2.8 mg/dL — ABNORMAL HIGH (ref 0.0–1.2)
Total Protein: 4.8 g/dL — ABNORMAL LOW (ref 6.4–8.3)

## 2016-04-05 LAB — CBC WITH AUTO DIFFERENTIAL
Basophils %: 0.2 % (ref 0.0–2.0)
Basophils Absolute: 0.02 E9/L (ref 0.00–0.20)
Eosinophils %: 0.5 % (ref 0.0–6.0)
Eosinophils Absolute: 0.05 E9/L (ref 0.05–0.50)
Hematocrit: 33.7 % — ABNORMAL LOW (ref 34.0–48.0)
Hemoglobin: 11 g/dL — ABNORMAL LOW (ref 11.5–15.5)
Immature Granulocytes #: 0.11 E9/L
Immature Granulocytes %: 1.1 % (ref 0.0–5.0)
Lymphocytes %: 22.4 % (ref 20.0–42.0)
Lymphocytes Absolute: 2.17 E9/L (ref 1.50–4.00)
MCH: 29.9 pg (ref 26.0–35.0)
MCHC: 32.6 % (ref 32.0–34.5)
MCV: 91.6 fL (ref 80.0–99.9)
MPV: 10.1 fL (ref 7.0–12.0)
Monocytes %: 11.9 % (ref 2.0–12.0)
Monocytes Absolute: 1.15 E9/L — ABNORMAL HIGH (ref 0.10–0.95)
Neutrophils %: 63.9 % (ref 43.0–80.0)
Neutrophils Absolute: 6.19 E9/L (ref 1.80–7.30)
Platelets: 134 E9/L (ref 130–450)
RBC: 3.68 E12/L (ref 3.50–5.50)
RDW: 16.6 fL — ABNORMAL HIGH (ref 11.5–15.0)
WBC: 9.7 E9/L (ref 4.5–11.5)

## 2016-04-05 LAB — MAGNESIUM: Magnesium: 2.3 mg/dL (ref 1.6–2.6)

## 2016-04-05 LAB — APTT: aPTT: 57.8 s — ABNORMAL HIGH (ref 24.5–35.1)

## 2016-04-05 MED FILL — ISUPREL 0.2 MG/ML IJ SOLN: 0.2 MG/ML | INTRAMUSCULAR | Qty: 5

## 2016-04-05 MED FILL — HEPARIN SOD (PORCINE) IN D5W 100 UNIT/ML IV SOLN: 100 UNIT/ML | INTRAVENOUS | Qty: 250

## 2016-04-05 MED FILL — KCL IN DEXTROSE-NACL 20-5-0.45 MEQ/L-%-% IV SOLN: INTRAVENOUS | Qty: 1000

## 2016-04-05 NOTE — Care Coordination-Inpatient (Addendum)
SOCIAL WORK AND DISCHARGE PLANNING: PT/OT REC: [facility]. I CALLED AND LEFT VM FOR DAUGHTER,CAROL, TO RETURN CALL AND OF RECOMMENDATIONS. WILL FOLLOW UP IN A.M. Pt LIVES WITH DAUGHTER AND REQUIRES ASSIST WITH ADLS AND HAS HHC AIDES. Pt USES A FWW. Abigail MiyamotoLisa Blandon Offerdahl  04/05/2016    [facility] IS ACTIVE . NEED [facility] ORDERS ON D/C. SPOKE WITH DAUGHTER AND SHE IS DECLINING [facility]. LEFT STICKY NOTE FOR RN AND PCP. Abigail MiyamotoLisa Fusae Florio  04/05/2016

## 2016-04-05 NOTE — Progress Notes (Signed)
GI: Has been NPO and has not spit up at all. If a foreign body may have passe after 5 to 6 days but she refuses even a trial of water in small quantities. Since we have clearance, will proceed with EGD tomorrow.

## 2016-04-05 NOTE — Plan of Care (Signed)
Problem: Falls - Risk of  Goal: Absence of falls  Outcome: Met This Shift

## 2016-04-05 NOTE — Progress Notes (Signed)
Physical Therapy    Facility[facility]Department[facility]  Initial Assessment    NAME: [name]  DOB: 05-Feb-1928  MRN: [id number]    Date of Service: 04/05/2016    Evaluating Therapist: Elenor Quinonesrudy L. Enya Bureau, PT, DPT    Recommendation for discharge is SAR  Equipment needs: TBD    Room #: [id number]  DIAGNOSIS: Non-sustained ventricular tachycardia  PRECAUTIONS: Falls, HOH    Social:  Pt lives with daughter in a 1 floor plan with 1 step(s) and no rail(s) to enter.  [facility]. Required assistance with ADLs. Has HH aides.     Initial Evaluation  Date: 04/05/16 Treatment  Date: NA    Short Term/ Long Term   Goals   Was pt agreeable to Eval/treatment? Yes      Does pt have pain? Reports pain in her chest and buttocks, did not quantify.     Bed Mobility  Rolling: Min A  Supine to sit: Min A  Sit to supine: Mod A  Scooting: Min A  SBA   Transfers Sit to stand: Min A  Stand to sit: Min A  Stand pivot: Min A  SBA   Ambulation    12 feet with [employer] with Min A  >50 feet with [employer] with SBA   Stair negotiation: ascended and descended  NT  2 steps with 1 rail with Min A   AM-PAC Score 17/24     VITALS  HR  SpO2  RR  BP PRE  104  86%  21  149/73 POST  91  92%  20  145/76      Pt is alert and Oriented x 1, self only. Moderate confusion. History of dementia. Patient anxious and wanting to be in the hospital for surgery. Attempted to explain to patient that she was in the hospital with poor understanding.  BLE ROM is WFL.  BLE strength is grossly 4/5.  Balance: Seated: SBA; Standing: Min A with [employer]  Sensation: Denies N/T  Edema: None noted.  Endurance: Fair  Bed/Chair alarm: Yes     ASSESSMENT  Pt displays functional ability as noted in the objective portion of this evaluation.      Comments/Treatment:  Patient cleared by nurse and agreeable to assessment. Patient with moderate confusion but able to follow commands 75% of the time. Patient found to be involuntary of bowel and stated she was unaware. Patient  assisted to standing and dependent for hygiene. Patient continued to have a bowel movement and transferred to seated on commode. Patient again dependent for hygiene. Patient reported wearing a diaper at home. Patient demonstrated mild unsteadiness on feet and while ambulating. Gait was slow with step to gait pattern with multiple verbal cues for walker management. Patient assisted back to supine as it was felt she was not safe to be up in a chair due to confusion. Patient's daughter arrived and provided home set up. Patient remained supine with call light and tray table in reach.     Patient education  Pt educated on safety with mobility, transfers, gait.    Patient response to education:   Pt verbalized understanding Pt demonstrated skill Pt requires further education in this area   Yes  Yes with verbal cues and assist Yes      Rehab potential is Good for reaching above PT goals.    Pt???s/ family goals   1. To go home.    Patient and or family understand(s)  diagnosis, prognosis, and plan of care. Patient, questionable. Daughter, yes.    PLAN  PT care will be provided in accordance with the objectives noted above.  Whenever appropriate, clear delegation orders will be provided for nursing staff.  Exercises and functional mobility practice will be used as well as appropriate assistive devices or modalities to obtain goals. Patient and family education will also be administered as needed.    Frequency of treatments will be 2-5x/week x 5-7 days.    Time in: 0905  Time out: 0945    Elenor Quinonesrudy L. Daiquan Resnik, PT, DPT   License number:  [id number]:  [id number]

## 2016-04-05 NOTE — Progress Notes (Signed)
Subjective:    The patient is awake and alert.  No problems overnight.  Denies chest pain, angina, and dyspnea.  Denies abdominal pain.  Tolerating diet.  No nausea or vomiting.    Objective:    BP (!) 149/73    Pulse 100    Temp 97.9 ??F (36.6 ??C) (Temporal)    Resp 15    Ht 5\' 5"  (1.651 m)    Wt 157 lb 3.2 oz (71.3 kg)    SpO2 93%    BMI 26.16 kg/m??     Heart:  RRR, no murmurs, gallops, or rubs.  Lungs:  CTA bilaterally, no wheeze, rales or rhonchi  Abd: bowel sounds present, nontender, nondistended, no masses  Extrem:  No clubbing, cyanosis, or edema    CBC with Differential:    Lab Results   Component Value Date    WBC 9.7 04/05/2016    RBC 3.68 04/05/2016    HGB 11.0 04/05/2016    HCT 33.7 04/05/2016    PLT 134 04/05/2016    MCV 91.6 04/05/2016    MCH 29.9 04/05/2016    MCHC 32.6 04/05/2016    RDW 16.6 04/05/2016    LYMPHOPCT 22.4 04/05/2016    MONOPCT 11.9 04/05/2016    BASOPCT 0.2 04/05/2016    MONOSABS 1.15 04/05/2016    LYMPHSABS 2.17 04/05/2016    EOSABS 0.05 04/05/2016    BASOSABS 0.02 04/05/2016     CMP:    Lab Results   Component Value Date    NA 141 04/05/2016    K 4.1 04/05/2016    CL 103 04/05/2016    CO2 24 04/05/2016    BUN 16 04/05/2016    CREATININE 0.7 04/05/2016    GFRAA >60 04/05/2016    LABGLOM >60 04/05/2016    GLUCOSE 144 04/05/2016    PROT 4.8 04/05/2016    LABALBU 2.3 04/05/2016    CALCIUM 7.6 04/05/2016    BILITOT 2.8 04/05/2016    ALKPHOS 206 04/05/2016    AST 104 04/05/2016    ALT 41 04/05/2016     BMP:    Lab Results   Component Value Date    NA 141 04/05/2016    K 4.1 04/05/2016    CL 103 04/05/2016    CO2 24 04/05/2016    BUN 16 04/05/2016    LABALBU 2.3 04/05/2016    CREATININE 0.7 04/05/2016    CALCIUM 7.6 04/05/2016    GFRAA >60 04/05/2016    LABGLOM >60 04/05/2016    GLUCOSE 144 04/05/2016     Magnesium:    Lab Results   Component Value Date    MG 2.3 04/05/2016     Phosphorus:    Lab Results   Component Value Date    PHOS 2.6 02/23/2016     PT/INR:    Lab Results   Component  Value Date    PROTIME 22.6 04/03/2016    INR 2.0 04/03/2016     PTT:    Lab Results   Component Value Date    APTT 57.8 04/05/2016   [APTT}     Assessment:    Patient Active Problem List   Diagnosis   ??? Moderate protein-calorie malnutrition (HCC)   ??? [facility](HCC)   ??? COPD (chronic obstructive pulmonary disease) (HCC)   ??? NSTEMI (non-ST elevated myocardial infarction) (HCC)   ??? CAD (coronary artery disease), native coronary artery   ??? Hyperlipidemia LDL goal <100   ??? PUD (peptic ulcer disease)   ???  Dementia   ??? Acquired hypothyroidism   ??? Hypokalemia   ??? Persistent atrial fibrillation (HCC)   ??? Esophageal ring       Plan:  Supplement diet.  EP on board.  Continue breathing treatments and supplemental oxygne.  Cardiology on board.  Early conservative therapy chosen.  Continue medical management of ascad.  Continue aggressive lipid therapy.  PPI therapy.  Stable.  Continue synthroid.  Supplemented.  Rate controlled.  GI evaluation appreciated.  Pt/Ot evaluations for discharge planning.    Minda Dittoobert J Kilah Drahos  MD  10:00 AM  04/05/2016

## 2016-04-05 NOTE — Progress Notes (Signed)
Advanced Heart Failure and Pulmonary Hypertension  Critical Care Progress Note              Patient ID:  Recurrent VT, NSTEMI, esophageal obstruction        24-hour events:  -no acute overnight events  -Saturday evening had recurrent VT, ACLS initiated  -recovered neurologically  -currently in [facility] with ongoing isuprel infusion  -review of rhythm strips -> brady, some rhythm strips with QTC prolongation while others ambiguous QT interval (esp on 12 leads)  -she did respond to isuprel with minimal ectopy  -no chest pain        Telemetry:  Nsr, sb, vt, nsvt, pvcs      Invasive Hemodynamics:  none      Inotropes, Pressors, and Intravenous Therapy:  Isuprel at 2 mcg/min      VENT settings:   none      Mechanical circulatory support device:   none      BP (!) 129/57    Pulse 98    Temp 98.2 ??F (36.8 ??C) (Temporal)    Resp 20    Ht 5\' 5"  (1.651 m)    Wt 157 lb 3.2 oz (71.3 kg)    SpO2 100%    BMI 26.16 kg/m??   Wt Readings from Last 3 Encounters:   04/03/16 157 lb 3.2 oz (71.3 kg)   02/26/16 156 lb 9.6 oz (71 kg)   02/28/15 181 lb (82.1 kg)     Physical Exam:  BP (!) 129/57    Pulse 98    Temp 98.2 ??F (36.8 ??C) (Temporal)    Resp 20    Ht 5\' 5"  (1.651 m)    Wt 157 lb 3.2 oz (71.3 kg)    SpO2 100%    BMI 26.16 kg/m??   Wt Readings from Last 3 Encounters:   04/03/16 157 lb 3.2 oz (71.3 kg)   02/26/16 156 lb 9.6 oz (71 kg)   02/28/15 181 lb (82.1 kg)     Appearance: Awake, alert, no acute respiratory distress  Skin: Intact, no rash  Head: Normocephalic, atraumatic  Eyes: EOMI, no conjunctival erythema, anicteric sclerae  ENMT: No pharyngeal erythema, MMM, no rhinorrhea  Neck: Soft, supple, no JVD with a JVP 8  Lungs: Clear to auscultation bilaterally. No wheezes, rales, or rhonchi.  Cardiac: Regular rate and rhythm, +S1S2, no murmurs apparent  Abdomen: Soft, nontender, +bowel sounds  Extremities: Moves all extremities x 4, no lower extremity edema  Neurologic: No focal motor deficits apparent, normal mood and  affect  Peripheral Pulses: Intact posterior tibial pulses bilaterally          Laboratory Tests:  Reviewed  hypoK -repleting      Cardiac Tests:  ECG: reviewed      Telemetry findings reviewed: see above      Chest X-ray: reviewed      Echocardiogram: personally interpreted          Current Medications:  Current Facility-Administered Medications   Medication Dose Route Frequency Provider Last Rate Last Dose   ??? dextrose 5 % and 0.45 % NaCl with KCl 20 mEq infusion   Intravenous Continuous Minda Dittoobert J Moser, MD       ??? sodium chloride flush 0.9 % injection 10 mL  10 mL Intravenous 2 times per day Encarnacion Chuhomas A Bailey, MD   10 mL at 04/03/16 2051   ??? sodium chloride flush 0.9 % injection 10 mL  10 mL Intravenous PRN Encarnacion Chuhomas A Bailey, MD       ???  trimethobenzamide (TIGAN) injection 200 mg  200 mg Intramuscular Q6H PRN Encarnacion Chuhomas A Bailey, MD       ??? ipratropium-albuterol (DUONEB) nebulizer solution 1 ampule  1 ampule Inhalation Q4H PRN Encarnacion Chuhomas A Bailey, MD       ??? heparin (porcine) injection 4,000 Units  4,000 Units Intravenous PRN Warnell ForesterHayah Kassis-George, MD       ??? heparin (porcine) injection 2,000 Units  2,000 Units Intravenous PRN Warnell ForesterHayah Kassis-George, MD       ??? heparin 25,000 units in dextrose 5% 250 mL infusion  12 Units/kg/hr Intravenous Continuous Adamary Savary Kassis-George, MD 4.3 mL/hr at 04/04/16 1045 6 Units/kg/hr at 04/04/16 1045   ??? acetaminophen (TYLENOL) suppository 650 mg  650 mg Rectal Q4H PRN Encarnacion Chuhomas A Bailey, MD       ??? isoproterenol (ISUPREL) 1 mg in dextrose 5 % 250 mL infusion  2 mcg/min Intravenous Continuous Pete PeltAllan L Katz, DO 15 mL/hr at 04/04/16 1054 1 mcg/min at 04/04/16 1054     ??? dextrose 5% and 0.45% NaCl with KCl 20 mEq     ??? heparin (porcine) 6 Units/kg/hr (04/04/16 1045)   ??? isoproterenol (ISUPREL) infusion 1 mcg/min (04/04/16 1054)           ASSESSMENT/RECOMMENDATIONS:  1. VT arrest attributed to possible hypokalemia related bradycardia and QT prolongation  2. NSTEMI, type 1 versus type 2  3. Dysphagia  4. Normal  biventricular function on transthoracic echocardiogram  5. Dementia  6. AFIB, CHADS vasc 5  7. COPD  8. GERD  9. Hypothyroidism       1. Patient presented with recurrent ventricular tachycardia, with infarct pattern on ECG, and on recent ECG.  2. We attributed the VT to bradycardia induced likely by malnourishment related hypokalemia. We did consider ischemia induced bradycardia.  3. Therefore, there is no doubt she needs ischemia risk stratification/evaluation.  4. However, she is at the point where she cannot take anything by mouth. She is awaiting EGD. If we took her to the cath lab and she needed intervention she would not be able to ingest her dual antiplatelets. Therefore we are deferring LHC at this time. She is clinically stable, chest pain free, and TTE done yesterday with perfect biventricular function and specifically no wall motion abnormalities.   We may consider MPI as her ischemia eval and avoid LHC altogether given demention, functional decline, malnourishment. Along those same lines, if we suspect that life span will not reach 1 year from now, then class III indication for ICD implantation even as secondary prevention.       All the above was discussed with EP - Drs. Liane ComberKatz and Gemma, and discussed with interventional cardiology. Overall, after my discussion with family on Saturday about code status, it seems that they are not being realistic about the overall long term goals for her. She is an [age]ogenarian with dementia, malnourishment, and now major arrhythmia history. Suggest ongoing goals of care discussions with her and her family, with consideration of the involvement of palliative care. With that said, we are willing to proceed to ischemia risk stratification with MPI if patient's family continues to want and pursue this.         Liam RogersHayah M. Kassis-George, M.D.  Advanced Heart Failure and Pulmonary Hypertension  Mobile [phone]

## 2016-04-05 NOTE — Other (Signed)
Run of non-sustained vtach. Vss.  assessment remains unchangedSpontaneously resolved. Labs sent, pending

## 2016-04-05 NOTE — Progress Notes (Signed)
OCCUPATIONAL THERAPY INITIAL EVALUATION      Date:04/05/2016  Patient Name: [name]  MRN: [id number]  DOB: 1928/02/03  Room: 3819/3819-PR     Evaluating OT: [name], OTR/L [phone]    Discharge Recommendations: SAR for ADLS; functional endurance and safety    Recommended Adaptive Equipment: TBA in rehab: [employer], 3in1 commode     AM-PAC Inpatient Daily Activity Raw Score: 15/24    Diagnosis: Non-sustained V-tach     Past Medical History: Afib, HOH, dementia, COPD    Precautions: falls, O2, catheter     Home Living: Pt lives with daughter in a 2 floor home with 1 steps to enter and no rail(s); bed/bath on first floor.  Pt has HHC aides 2tx/wk for ADLS.   Bathroom setup: tub/shower with seat; pt sponge bathes at home  Equipment owned: shower seat, 3in1 commode    Prior Level of Function: mostly IND with ADLs;  Assist  with IADLs.  [employer] for ambulation.  Driving: no  Occupation: n/a    Pain Level: pt c/o no pain this session     Cognition: oriented x 1 (self only)   Pt consistently re-oriented to hospital; pt presents with decreased insight into situation. Pt follows 1-2 step commands with min cues for redirection and to maintain focus on task.  Pt demonstrates Poor safety awareness regarding walker safety and medical lines. Pt is a fall risk.      Vision/Perception  Hearing: very HOH  Vision: WFL   Perception: grossly WFL; decreased environmental awareness      UE Assessment:  Hand Dominance: Right [x]   Left []      ROM Strength Additional Info:    RUE  WFL 4/5 G grip and F+ FMC/dexterity noted during ADL tasks     LUE WFL 4/5 G grip and F+ FMC/dexterity noted during ADL tasks     Sensation: WFL  Tone: WFL   Edema:WFL    Functional Assessment:   Initial Status 12/11 Comments   Feeding  NPO    Grooming  Min A    Upper Body Dressing Mod A; due to medical lines     Lower Body Dressing Max A; seated EOB    Bathing Mod A    Toileting  Max A; standing WW level for hygiene; F tolerance    Bed Mobility  Supine to Sit: Min  A  Sit to Supine:Mod A with LE's    Functional Transfers Min A sit<>stand  Min A +2 low commode seat     Functional Mobility Min  A WW with poor walker safety/poor technique      Sit balance: SBA static; Min A dynamic   Stand balance: Min A [employer]  Endurance/Activity tolerance: fair with light to moderate activity     Vitals:   HR at rest: 101 bpm HR at end of session: 91 bpm   Spo2 at rest:93% Spo2 at end of session: Not registering%   BP at rest: 149/73 mmHg BP at end of session 145/76 mmHg                      Comments: Upon arrival pt supine in bed.  At end of session pt returned to bed per nursing request for safety with all devices within reach, all lines and tubes intact.Line management and environmental modifications made prior to and end of session to ensure patient safety and increased efficiency of treatment session.       Treatment: Patient assisted  to EOB to increase functional endurance in preparation of self care activities and functional transfers.  Patient required Min A to EOB; sat for 3-5 min  with F+balance & F tolerance.  Patient found to be incontinent of bowel upon standing. Pt encouraged to stand walker level 2-3 min to assist with hygiene.  Pt ambulated to commode walker level to continue with toileting needs.  Required Min A+2 for STS transfer due to low seat. Pt required min cues for posture; mod cues for walker safety and environmental awareness for fall prevention during task. Pt assisted back to bed.  Pt educated on LB dressing compensatory techniques to increase independence with ADLS.  Pt requires Min A for sitting balance.  Pt returned to bed; B LE elevated for edema control.  Daughter present.       Assessment of current deficits   Functional mobility [x]   ROM [x]  Strength [x]   Cognition [x]   ADLs [x]    IADLs [x]  Safety Awareness [x]  Endurance [x]   Fine Motor Coordination []  Balance [x]  Vision/perception []  Sensation []    Gross Motor Coordination []      Eval Complexity: low  Profile and  History- moderate-daughter present to provide hx  Assessment of Occupational Performance and Identification of Deficits-high- 5+ performance areas identified limiting functional independence and safe return home.  Clinical Decision Making- low    Treatment frequency:PRN 1-3 tx      Plan of Care:   ADL retraining [x]    Equipment needs [x]    Neuromuscular re-education []  Energy Conservation Techniques [x]   Functional Transfer training [x]  Patient and/or Family Education [x]   Functional Mobility training [x]   Environmental Modifications [x]   Cognitive re-training [x]    Compensatory techniques for ADLs [x]   Splinting Needs []    Positioning/joint protection []   Other: []       Rehab Potential: good     Patient / Family Goal: OOB activity and to go home per patient request     Short term goals  Time Frame: 3-5 days  GOAL (1) Increase feeding skills to Mod I while seated up in chair to increase activity tolerance once cleared for diet  GOAL (2) Increase grooming skills to SBA seated/standing sink level with F+ balance & G activity tolerance  GOAL (3) Increase UB dressing/bathing to S; set up  GOAL (4) Increase LB dressing/bathing to Min A using AE as needed for safe reach/energy conservation & demonstrating F+ balance/ F+/G safety  GOAL (4) Increase functional transfers/bathroom mobility to SBA using AD/DME as needed for balance/energy conservation & F+/G  safety      Patient and/or family understands diagnosis, prognosis and plan of care: yes    []  Malnutrition indicators have been identified and nursing has been notified to ensure a dietitian consult is ordered.     Time In: 0910  Time Out: 0946  Evaluation + 15 min timed treatment    [name], OTR/L [phone]

## 2016-04-05 NOTE — Plan of Care (Signed)
Problem: Falls - Risk of  Goal: Absence of falls  Outcome: Met This Shift      Problem: Risk for Impaired Skin Integrity  Goal: Tissue integrity - skin and mucous membranes  Structural intactness and normal physiological function of skin and  mucous membranes.   Outcome: Met This Shift

## 2016-04-05 NOTE — Progress Notes (Signed)
Nutrition Assessment    Type and Reason for Visit: Initial, Positive Nutrition Screen    Nutrition Recommendations: Continue NPO. Noted failed swallow eval. Plans for EGD. Please consult if EN support initiated. Will continue to monitor.     Malnutrition Assessment:  ?? Malnutrition Status: Meets the criteria for moderate malnutrition  ?? Findings of the 6 clinical characteristics of malnutrition (Minimum of 2 out of 6 clinical characteristics is required to make the diagnosis of moderate or severe Protein Calorie Malnutrition based on AND/ASPEN Guidelines):  1. Energy Intake-Less than or equal to 50%, greater than or equal to 5 days (.)    2. Weight Loss-No significant weight loss,  (x 2 months)  3. Fat Loss-Mild subcutaneous fat loss, Orbital  4. Muscle Loss-Mild muscle mass loss, Clavicles (pectoralis and deltoids)  5. Fluid Accumulation-No significant fluid accumulation  6. Grip Strength-Not measured    Nutrition Diagnosis:   ?? Problem: Moderate malnutrition, in context of acute illness or injury  ?? Etiology: related to Difficulty swallowing    ??? Signs and symptoms:  as evidenced by NPO status due to medical condition, Swallow study results, Diet history of poor intake, mild fat & muscle loss      Nutrition Assessment:  ?? Subjective Assessment: s/p code blue  ?? Nutrition-Focused Physical Findings: pt alert, confused, N/V pta, dysphagia, poor dentition, +I/O's, +1 BLE nonpitting edema, active BS   ?? Wound Type:  boggy heels noted    ?? Current Nutrition Therapies:  ?? Oral Diet Orders: NPO   ?? Oral Diet intake:  N/V x 3-4 days noted PTA    ?? Anthropometric Measures:  ?? Ht: 5\' 5"  (165.1 cm)   ?? Current Body Wt: 160 lb (72.6 kg) (12/11 bedscale)  ?? Admission Body Wt: 157 lb (71.2 kg) (12/9 first measured)  ?? Usual Body Wt: 156 lb (70.8 kg) (EMR actual 02/09/16)  ?? % Weight Change: CBW slightly elevated +fluid at this time. Otherwise appears stable since previous encounter 2 mon ago  ?? Ideal Body Wt: 125 lb (56.7  kg), % Ideal Body 128%  ?? BMI Classification: BMI 25.0 - 29.9 Overweight     ?? Comparative Standards (Estimated Nutrition Needs):  ?? Estimated Daily Total Kcal: 1400-1500 (MSJ REE 1174 x 1.2 SF )  ?? Estimated Daily Protein (g): 90-100    Estimated Intake vs Estimated Needs: Intake Less Than Needs    Nutrition Risk Level: Moderate    Nutrition Interventions: Continued Inpatient Monitoring, Education not appropriate at this time, Coordination of Care (Pt status discussed with RN)    Nutrition Evaluation:   ?? Evaluation: Goals set   ?? Goals: Nutrition progression     ?? Monitoring: NPO Status, Skin Integrity, Chewing/Swallowing, Fluid Balance, Ascites/Edema, Mental Status/Confusion, Weight, Comparative Standards, Pertinent Labs, Nausea or Vomiting    See Adult Nutrition Doc Flowsheet for more detail.     Electronically signed by Hillard Dankereann Brinna Divelbiss, RD, LD on 04/05/16 at 10:53 AM    Contact Number: Ext [phone]

## 2016-04-05 NOTE — Plan of Care (Signed)
Problem: Nutrition  Goal: Optimal nutrition therapy  Outcome: Ongoing  Nutrition Problem: Moderate malnutrition, in context of acute illness or injury  Intervention: Food and/or Nutrient Delivery: Continue NPO (plans for EGD. Please consult if EN support initiated)  Nutritional Goals: Nutrition progression

## 2016-04-05 NOTE — Other (Signed)
Message left for A. Fitzgerald with consult

## 2016-04-05 NOTE — Progress Notes (Signed)
ELECTROPHYSIOLOGY PROGRESS NOTE    [name]  09-04-1927  Date of Service: 04/05/2016  PCP: Otilio Saber, DO  Primary Electrophysiologist: Barnie Alderman, DO  Chief Complaint: PMVT, AF, hypokalemia        Subjective: [name] is seen in hospital follow-up. She is confused this am but denies complaints. Her daughter is at the bedside. She is currently in AF with a controlled ventricular response. Isoproterenol drip is at 1 mcg/min.     Patient Active Problem List   Diagnosis   ??? Moderate protein-calorie malnutrition (HCC)   ??? V-tach [facility])   ??? COPD (chronic obstructive pulmonary disease) (HCC)   ??? NSTEMI (non-ST elevated myocardial infarction) (HCC)   ??? CAD (coronary artery disease), native coronary artery   ??? Hyperlipidemia LDL goal <100   ??? PUD (peptic ulcer disease)   ??? Dementia   ??? Acquired hypothyroidism   ??? Hypokalemia   ??? Persistent atrial fibrillation (HCC)   ??? Esophageal ring         Scheduled Medications:  ??? sodium chloride flush  10 mL Intravenous 2 times per day       Infusion Medications:  ??? dextrose 5% and 0.45% NaCl with KCl 20 mEq 125 mL/hr at 04/05/16 0800   ??? heparin (porcine) 6 Units/kg/hr (04/05/16 0800)       PRN Medications:  sodium chloride flush, trimethobenzamide, ipratropium-albuterol, heparin (porcine), heparin (porcine), acetaminophen    Allergies   Allergen Reactions   ??? Aspirin      Reaction unknown   ??? Chicken Allergy      Allergic to poultry (eggs, chicken, [place])   ??? Eggs Or Egg-Derived Products Nausea And Vomiting   ??? Sulfa Antibiotics Nausea And Vomiting       Wt Readings from Last 3 Encounters:   04/03/16 157 lb 3.2 oz (71.3 kg)   02/26/16 156 lb 9.6 oz (71 kg)   02/28/15 181 lb (82.1 kg)     Temp Readings from Last 3 Encounters:   04/05/16 97.9 ??F (36.6 ??C) (Temporal)   02/26/16 98.2 ??F (36.8 ??C) (Oral)   02/28/15 98.6 ??F (37 ??C) (Temporal)     BP Readings from Last 3 Encounters:   04/05/16 (!) 149/73   02/26/16 (!) 105/51   02/28/15 (!) 101/56     Pulse Readings from  Last 3 Encounters:   04/05/16 100   02/26/16 67   02/28/15 80         Intake/Output Summary (Last 24 hours) at 04/05/16 1031  Last data filed at 04/05/16 0800   Gross per 24 hour   Intake              967 ml   Output              502 ml   Net              465 ml          Physical exam:  Constitutional: BP (!) 149/73    Pulse 100    Temp 97.9 ??F (36.6 ??C) (Temporal)    Resp 15    Ht 5\' 5"  (1.651 m)    Wt 157 lb 3.2 oz (71.3 kg)    SpO2 93%    BMI 26.16 kg/m??  well developed, in no acute distress   Eyes: conjunctivae normal, no xanthelasma   Ears, Nose, Throat: oral mucosa moist, no cyanosis   Neck: supple, no JVD, no bruits, no thyromegaly  CV: normal rate, irregularly irregular rhythm, normal S1 & S2, No S3 or S4. No murmurs, rubs, or gallops. Peripheral pulses normal   Lungs: clear to auscultation bilaterally, normal respiratory effort   Abdomen: soft, non-tender, bowel sounds present, no masses or hepatomegaly   Extremities: no digital clubbing, no edema   Skin: warm, no rashes   Neuro/Psych: awake and alert, confused    ECG: AF, QT grossly normal today    Rhythm strip review: AF, rates 60-70s    Recent Labs      04/03/16   0930  04/04/16   0333  04/05/16   0540   WBC  9.1  11.6*  9.7   HGB  11.8  11.4*  11.0*   HCT  36.4  35.6  33.7*   MCV  93.3  92.0  91.6   PLT  159  156  134     Recent Labs      04/04/16   0333  04/04/16   0425  04/04/16   1512  04/05/16   0540   NA  139  143   --   141   K  6.0*  3.3*  4.9  4.1   CL  101  101   --   103   CO2  26  29   --   24   BUN  17  18   --   16   CREATININE  0.8  0.9   --   0.7     Recent Labs      04/03/16   0930   PROTIME  22.6*   INR  2.0     Recent Labs      04/03/16   1015  04/03/16   1531   TROPONINI  0.03  0.04*     No results for input(s): BNP in the last 72 hours.  No results for input(s): CHOL, HDL, TRIG in the last 72 hours.    Invalid input(s): CHOLHDLR, LDLCALCU  PT/INR:    Lab Results   Component Value Date    PROTIME 22.6 04/03/2016    INR 2.0 04/03/2016      TSH:    Lab Results   Component Value Date    TSH 2.900 04/03/2016       Impression:    1. PMVT arrest  - Felt to be secondary to prolonged QT interval and hypokalemia. Appreciate cardiology input re: possible ischemic evaluation.   - currently on Isuprel at 1 mg  - no recurrent PMVT on isuprel  - K+ being replaced  - Holding all QT prolonging medications: Aricept, celexa, prilosec, zofran  - echo pending  ??  2. Prolonged QTc  - 2/2 Aricept, celexa, prilosec in combination with hypokalemia  - QT improved  ??  3. Long-standing persistent/permanent AF  - current in AF/Fl   - was on Pradaxa as an OP.  - would use Eliquis 5 mg BID at present--however needs swallow evaluation prior to starting it  - currently on IV heparin  ??  4. Hypokalemia  - continue replacement  ??  5. Dementia  - on Aricept as an OP  ??  6. HLD  - Zocor  ??  7. Hypothyroidism  - on synthroid replacement  ??  8. Depression  - on celexa as an OP  ??  9. COPD  ??  Recommendations:  ??  1. Discontinue Isoproterenol  2. Continue to hold aricept, celexa, and prilosec.  Also avoid  all QT-prolonging medications.  3. Once she is able to take po, would recommend changing Pradaxa to Eliquis 5 mg BID.  4. 2D-echocardiogram is pending.    5. Daily ECGs.  6. Daily BMP, Mg.   7. She remains a Full code at present. Discussed with pt's daughter at bedside and her son Christen BameRonnie via telephone today.   8. Okay to proceed with EGD from EP standpoint    Thank you for allowing me the opportunity to participate in the care of your patient.     Marylene BuergerLee W. Blaize Nipper, MD, [facility] SpringsFHRS  Cardiac Electrophysiology  [facility]  04/05/2016

## 2016-04-06 ENCOUNTER — Encounter: Attending: Gastroenterology | Primary: Family Medicine

## 2016-04-06 LAB — CBC WITH AUTO DIFFERENTIAL
Basophils %: 0.2 % (ref 0.0–2.0)
Basophils Absolute: 0.02 E9/L (ref 0.00–0.20)
Eosinophils %: 0.5 % (ref 0.0–6.0)
Eosinophils Absolute: 0.06 E9/L (ref 0.05–0.50)
Hematocrit: 37.2 % (ref 34.0–48.0)
Hemoglobin: 12.1 g/dL (ref 11.5–15.5)
Immature Granulocytes #: 0.13 E9/L
Immature Granulocytes %: 1.1 % (ref 0.0–5.0)
Lymphocytes %: 18 % — ABNORMAL LOW (ref 20.0–42.0)
Lymphocytes Absolute: 2.07 E9/L (ref 1.50–4.00)
MCH: 30 pg (ref 26.0–35.0)
MCHC: 32.5 % (ref 32.0–34.5)
MCV: 92.3 fL (ref 80.0–99.9)
MPV: 10.9 fL (ref 7.0–12.0)
Monocytes %: 11.3 % (ref 2.0–12.0)
Monocytes Absolute: 1.3 E9/L — ABNORMAL HIGH (ref 0.10–0.95)
Neutrophils %: 68.9 % (ref 43.0–80.0)
Neutrophils Absolute: 7.92 E9/L — ABNORMAL HIGH (ref 1.80–7.30)
Platelets: 123 E9/L — ABNORMAL LOW (ref 130–450)
RBC: 4.03 E12/L (ref 3.50–5.50)
RDW: 16.4 fL — ABNORMAL HIGH (ref 11.5–15.0)
WBC: 11.5 E9/L (ref 4.5–11.5)

## 2016-04-06 LAB — BASIC METABOLIC PANEL
Anion Gap: 7 mmol/L (ref 7–16)
BUN: 13 mg/dL (ref 8–23)
CO2: 25 mmol/L (ref 22–29)
Calcium: 7.4 mg/dL — ABNORMAL LOW (ref 8.6–10.2)
Chloride: 103 mmol/L (ref 98–107)
Creatinine: 0.5 mg/dL (ref 0.5–1.0)
GFR African American: >60
GFR Non-African American: >60 mL/min/{1.73_m2} (ref >=60)
Glucose: 409 mg/dL — ABNORMAL HIGH (ref 74–109)
Potassium: 5.1 mmol/L — ABNORMAL HIGH (ref 3.5–5.0)
Sodium: 135 mmol/L (ref 132–146)

## 2016-04-06 LAB — COMPREHENSIVE METABOLIC PANEL
ALT: 44 U/L — ABNORMAL HIGH (ref 0–32)
AST: 102 U/L — ABNORMAL HIGH (ref 0–31)
Albumin: 2.1 g/dL — ABNORMAL LOW (ref 3.5–5.2)
Alkaline Phosphatase: 227 U/L — ABNORMAL HIGH (ref 35–104)
Anion Gap: 9 mmol/L (ref 7–16)
BUN: 11 mg/dL (ref 8–23)
CO2: 26 mmol/L (ref 22–29)
Calcium: 7.7 mg/dL — ABNORMAL LOW (ref 8.6–10.2)
Chloride: 101 mmol/L (ref 98–107)
Creatinine: 0.5 mg/dL (ref 0.5–1.0)
GFR African American: >60
GFR Non-African American: >60 mL/min/{1.73_m2} (ref >=60)
Glucose: 94 mg/dL (ref 74–109)
Potassium: 3.9 mmol/L (ref 3.5–5.0)
Sodium: 136 mmol/L (ref 132–146)
Total Bilirubin: 2.8 mg/dL — ABNORMAL HIGH (ref 0.0–1.2)
Total Protein: 4.8 g/dL — ABNORMAL LOW (ref 6.4–8.3)

## 2016-04-06 LAB — APTT: aPTT: 55.8 s — ABNORMAL HIGH (ref 24.5–35.1)

## 2016-04-06 LAB — MAGNESIUM
Magnesium: 1.9 mg/dL (ref 1.6–2.6)
Magnesium: 2.1 mg/dL (ref 1.6–2.6)

## 2016-04-06 MED ORDER — HEPARIN SODIUM (PORCINE) 10000 UNIT/ML IJ SOLN
10000 UNIT/ML | Freq: Two times a day (BID) | INTRAMUSCULAR | Status: DC
Start: 2016-04-06 — End: 2016-04-11
  Administered 2016-04-06 – 2016-04-11 (×10): 5000 [IU] via SUBCUTANEOUS

## 2016-04-06 MED ORDER — FAMOTIDINE 20 MG/2ML IV SOLN
20 MG/2ML | Freq: Two times a day (BID) | INTRAVENOUS | Status: DC
Start: 2016-04-06 — End: 2016-04-11
  Administered 2016-04-07 – 2016-04-11 (×9): 20 mg via INTRAVENOUS

## 2016-04-06 MED ORDER — PANTOPRAZOLE SODIUM 40 MG IV SOLR
40 MG | Freq: Every day | INTRAVENOUS | Status: DC
Start: 2016-04-06 — End: 2016-04-06
  Administered 2016-04-06: 14:00:00 40 mg via INTRAVENOUS

## 2016-04-06 MED ORDER — SODIUM CHLORIDE 0.9 % IJ SOLN
0.9 % | Freq: Every day | INTRAMUSCULAR | Status: DC
Start: 2016-04-06 — End: 2016-04-17
  Administered 2016-04-06 – 2016-04-16 (×7): 10 mL via INTRAVENOUS

## 2016-04-06 MED ORDER — HYDROPHOR EX OINT
Freq: Two times a day (BID) | CUTANEOUS | Status: DC | PRN
Start: 2016-04-06 — End: 2016-04-17

## 2016-04-06 MED ORDER — FAMOTIDINE PREMIXED 20-0.9 MG/50ML-% IV SOLN
Freq: Two times a day (BID) | INTRAVENOUS | Status: DC
Start: 2016-04-06 — End: 2016-04-06

## 2016-04-06 MED FILL — HEPARIN SODIUM (PORCINE) 10000 UNIT/ML IJ SOLN: 10000 UNIT/ML | INTRAMUSCULAR | Qty: 1

## 2016-04-06 MED FILL — KCL IN DEXTROSE-NACL 20-5-0.45 MEQ/L-%-% IV SOLN: INTRAVENOUS | Qty: 1000

## 2016-04-06 MED FILL — DIPRIVAN 200 MG/20ML IV EMUL: 200 MG/20ML | INTRAVENOUS | Qty: 20

## 2016-04-06 MED FILL — NORMAL SALINE FLUSH 0.9 % IV SOLN: 0.9 % | INTRAVENOUS | Qty: 100

## 2016-04-06 MED FILL — HYDROPHOR EX OINT: CUTANEOUS | Qty: 454

## 2016-04-06 MED FILL — PROTONIX 40 MG IV SOLR: 40 MG | INTRAVENOUS | Qty: 40

## 2016-04-06 MED FILL — SODIUM CHLORIDE 0.9 % IJ SOLN: 0.9 % | INTRAMUSCULAR | Qty: 10

## 2016-04-06 NOTE — Anesthesia Post-Procedure Evaluation (Signed)
Department of Anesthesiology  Post-Anesthesia Note    Name:  [name]                                         Age:  82 y.o.  MRN:  [id number]     Last Vitals:  BP 133/66    Pulse 88    Temp 97.4 ??F (36.3 ??C) (Temporal)    Resp 17    Ht 5\' 5"  (1.651 m)    Wt 160 lb (72.6 kg)    SpO2 96%    BMI 26.63 kg/m??   Patient Vitals for the past 4 hrs:   BP Temp Temp src Pulse Resp SpO2   04/06/16 2000 133/66 97.4 ??F (36.3 ??C) Temporal 88 17 96 %   04/06/16 1900 (!) 156/69 - - 71 22 -   04/06/16 1800 139/77 - - 89 23 97 %       Level of Consciousness:  Awake    Respiratory:  Stable    Oxygen Saturation:  Stable    Cardiovascular:  Stable    Hydration:  Adequate    PONV:  Stable    Post-op Pain:  Adequate analgesia    Post-op Assessment:  No apparent anesthetic complications    Additional Follow-Up / Treatment / Comment:  None    Mena PaulsSteven Mitchell Yaphet Smethurst, DO  April 06, 2016   9:37 PM

## 2016-04-06 NOTE — Plan of Care (Signed)
Problem: Falls - Risk of  Goal: Absence of falls  Outcome: Met This Shift      Problem: Risk for Impaired Skin Integrity  Goal: Tissue integrity - skin and mucous membranes  Structural intactness and normal physiological function of skin and  mucous membranes.   Outcome: Met This Shift      Problem: Nutrition  Goal: Optimal nutrition therapy  Outcome: Not Met This Shift  NPO

## 2016-04-06 NOTE — Anesthesia Pre-Procedure Evaluation (Signed)
Department of Anesthesiology  Preprocedure Note       Name:  [name]   Age:  82 y.o.  DOB:  March 18, 1928                                          MRN:  [id number]         Date:  04/06/2016      Surgeon: Jearld AdjutantMarina    Procedure: EGD  Medications prior to admission:   Prior to Admission medications    Medication Sig Start Date End Date Taking? Authorizing Provider   ondansetron (ZOFRAN) 4 MG tablet Take 1 tablet by mouth every 8 hours as needed for Nausea or Vomiting 02/26/16  Yes Willadean CarolJoseph Yurich, MD   omeprazole (PRILOSEC) 40 MG delayed release capsule Take 1 capsule by mouth daily 02/28/15  Yes Erling ConteJoseph A Ambrose, MD   MULTIPLE VITAMIN PO Take by mouth daily   Yes Historical Provider, MD   buPROPion [facility](WELLBUTRIN SR) 150 MG extended release tablet Take 150 mg by mouth nightly   Yes Historical Provider, MD   donepezil (ARICEPT) 10 MG tablet Take 10 mg by mouth nightly   Yes Historical Provider, MD   simvastatin (ZOCOR) 20 MG tablet Take 20 mg by mouth nightly.     Yes Historical Provider, MD   levothyroxine (SYNTHROID) 50 MCG tablet Take 50 mcg by mouth daily.     Yes Historical Provider, MD   citalopram (CELEXA) 20 MG tablet Take 20 mg by mouth daily Instructed to take with sip water am of procedure   Yes Historical Provider, MD   montelukast (SINGULAIR) 10 MG tablet Take 10 mg by mouth nightly.     Yes Historical Provider, MD   raloxifene (EVISTA) 60 MG tablet Take 60 mg by mouth daily.     Yes Historical Provider, MD   pregabalin (LYRICA) 150 MG capsule Take 150 mg by mouth 2 times daily Instructed to take with sip water am of procedure   Yes Historical Provider, MD   digoxin (LANOXIN) 0.125 MG tablet Take 125 mcg by mouth every 48 hours Takes at night every other day   Yes Historical Provider, MD   dabigatran (PRADAXA) 150 MG capsule Take 150 mg by mouth 2 times daily.     Yes Historical Provider, MD   albuterol sulfate HFA 108 (90 BASE) MCG/ACT inhaler Inhale 2 puffs into the lungs every 6 hours as needed for Wheezing  (last took this am)    Historical Provider, MD   omeprazole (PRILOSEC) 20 MG delayed release capsule Take 2 capsules by mouth 2 times daily for 14 days 01/19/15 02/02/15  Delories Heinzaryl Donald, MD   ipratropium-albuterol (DUONEB) 0.5-2.5 (3) MG/3ML SOLN nebulizer solution Inhale 1 vial into the lungs every 4 hours Uses prn / instructed if needs to use dos    Historical Provider, MD       Current medications:    Current Facility-Administered Medications   Medication Dose Route Frequency Provider Last Rate Last Dose   ??? heparin (porcine) injection 5,000 Units  5,000 Units Subcutaneous BID Warnell ForesterHayah Kassis-George, MD       ??? pantoprazole (PROTONIX) injection 40 mg  40 mg Intravenous Daily Hayah Kassis-George, MD   40 mg at 04/06/16 0857    And   ??? sodium chloride (PF) 0.9 % injection 10 mL  10 mL Intravenous Daily Warnell ForesterHayah Kassis-George, MD  10 mL at 04/06/16 0857   ??? mineral oil-hydrophilic petrolatum (HYDROPHOR) ointment   Topical BID PRN Minda Dittoobert J Moser, MD       ??? dextrose 5 % and 0.45 % NaCl with KCl 20 mEq infusion   Intravenous Continuous Minda Dittoobert J Moser, MD 100 mL/hr at 04/06/16 0800     ??? sodium chloride flush 0.9 % injection 10 mL  10 mL Intravenous 2 times per day Encarnacion Chuhomas A Bailey, MD   10 mL at 04/06/16 0858   ??? sodium chloride flush 0.9 % injection 10 mL  10 mL Intravenous PRN Encarnacion Chuhomas A Bailey, MD       ??? trimethobenzamide Dorothey Baseman(TIGAN) injection 200 mg  200 mg Intramuscular Q6H PRN Encarnacion Chuhomas A Bailey, MD       ??? ipratropium-albuterol (DUONEB) nebulizer solution 1 ampule  1 ampule Inhalation Q4H PRN Encarnacion Chuhomas A Bailey, MD       ??? acetaminophen (TYLENOL) suppository 650 mg  650 mg Rectal Q4H PRN Encarnacion Chuhomas A Bailey, MD           Allergies:    Allergies   Allergen Reactions   ??? Aspirin      Reaction unknown   ??? Chicken Allergy      Allergic to poultry (eggs, chicken, [place])   ??? Eggs Or Egg-Derived Products Nausea And Vomiting   ??? Sulfa Antibiotics Nausea And Vomiting       Problem List:    Patient Active Problem List   Diagnosis Code   ???  Moderate protein-calorie malnutrition (HCC) E44.0   ??? V-tach [facility](HCC) I47.2   ??? COPD (chronic obstructive pulmonary disease) (HCC) J44.9   ??? NSTEMI (non-ST elevated myocardial infarction) (HCC) I21.4   ??? CAD (coronary artery disease), native coronary artery I25.10   ??? Hyperlipidemia LDL goal <100 E78.5   ??? PUD (peptic ulcer disease) K27.9   ??? Dementia F03.90   ??? Acquired hypothyroidism E03.9   ??? Hypokalemia E87.6   ??? Persistent atrial fibrillation (HCC) I48.1   ??? Esophageal ring K22.2       Past Medical History:        Diagnosis Date   ??? A-fib (HCC)    ??? Anticoagulated     on pradaxa   ??? Asthma    ??? COPD (chronic obstructive pulmonary disease) (HCC)    ??? Depression    ??? Forgetfulness     capable of signing own consents   ??? GERD (gastroesophageal reflux disease)    ??? HOH (hard of hearing)     no aides   ??? Hyperlipidemia    ??? Pain     bilateral upper quadrant /  for EGD   ??? Restless leg syndrome    ??? Thyroid disease        Past Surgical History:        Procedure Laterality Date   ??? ABDOMEN SURGERY  02/19/2016    laprascopic with graham patch of gastric ulcer   ??? BACK SURGERY  2010?    lumbar    ??? BREAST SURGERY      Lumpectomy   ??? ENDOSCOPY, COLON, DIAGNOSTIC  02/28/2015   ??? HYSTERECTOMY  1970's   ??? JOINT REPLACEMENT Bilateral 2011?    knee   ??? VARICOSE VEIN SURGERY Bilateral 2012       Social History:    Social History   Substance Use Topics   ??? Smoking status: Never Smoker   ??? Smokeless tobacco: Never Used   ??? Alcohol use No  Counseling given: Not Answered      Vital Signs (Current):   Vitals:    04/06/16 0700 04/06/16 0800 04/06/16 1000 04/06/16 1100   BP: (!) 127/51 (!) 157/67 136/74 (!) 143/49   Pulse: 51 54 50 64   Resp: 20 25 23 19    Temp:       TempSrc:       SpO2: 90%  (!) 88% (!) 89%   Weight:       Height:                                                  BP Readings from Last 3 Encounters:   04/06/16 (!) 143/49   02/26/16 (!) 105/51   02/28/15 (!) 101/56       NPO Status:                                                                                  BMI:   Wt Readings from Last 3 Encounters:   04/05/16 160 lb (72.6 kg)   02/26/16 156 lb 9.6 oz (71 kg)   02/28/15 181 lb (82.1 kg)     Body mass index is 26.63 kg/m??.    Anesthesia Evaluation  Patient summary reviewed and Nursing notes reviewed no history of anesthetic complications:   Airway: Mallampati: III  TM distance: >3 FB   Neck ROM: full  Mouth opening: < 3 FB Dental:      Comment: None loose    Pulmonary: breath sounds clear to auscultation  (+) COPD:  asthma:                            Cardiovascular:    (+) past MI (NSTEMI):, CAD:,         Rhythm: regular  Rate: normal                 ROS comment: Recurrent VTAC    PE comment: EF 65%  Severe AR       Neuro/Psych:                ROS comment: HOH  Dementia GI/Hepatic/Renal:   (+) GERD:,          ROS comment: Possible foreign body in esophagus.   Endo/Other:    (+) hypothyroidism::., .                 Abdominal:           Vascular:                                        Anesthesia Plan      MAC     ASA 4             Anesthetic plan and risks discussed with patient.      Plan discussed with CRNA.  Mena Pauls, DO   04/06/2016    Pt. Examined, chart reviewed.  Pt. Is very hard of hearing.  No family present to speak with.    Mena Pauls, DO  April 06, 2016  12:28 PM

## 2016-04-06 NOTE — Other (Signed)
Phone consent obtained from daughter for EGD today

## 2016-04-06 NOTE — Progress Notes (Addendum)
Advanced Heart Failure and Pulmonary Hypertension  Critical Care Progress Note              Patient ID:  Recurrent VT, NSTEMI, esophageal obstruction        24-hour events:  -no acute overnight events  -plans for egd tomorrow      Telemetry:  Nsr, sb, vt, nsvt, pvcs      Invasive Hemodynamics:  none      Inotropes, Pressors, and Intravenous Therapy:  Isuprel at 2 mcg/min --> OFF      VENT settings:   none      Mechanical circulatory support device:   none      BP (!) 127/51    Pulse 51    Temp 98.4 ??F (36.9 ??C) (Temporal)    Resp 20    Ht 5\' 5"  (1.651 m)    Wt 160 lb (72.6 kg)    SpO2 90%    BMI 26.63 kg/m??   Wt Readings from Last 3 Encounters:   04/05/16 160 lb (72.6 kg)   02/26/16 156 lb 9.6 oz (71 kg)   02/28/15 181 lb (82.1 kg)     Physical Exam:  BP (!) 127/51    Pulse 51    Temp 98.4 ??F (36.9 ??C) (Temporal)    Resp 20    Ht 5\' 5"  (1.651 m)    Wt 160 lb (72.6 kg)    SpO2 90%    BMI 26.63 kg/m??   Wt Readings from Last 3 Encounters:   04/05/16 160 lb (72.6 kg)   02/26/16 156 lb 9.6 oz (71 kg)   02/28/15 181 lb (82.1 kg)     Appearance: Awake, alert, no acute respiratory distress  Skin: Intact, no rash  Head: Normocephalic, atraumatic  Eyes: EOMI, no conjunctival erythema, anicteric sclerae  ENMT: No pharyngeal erythema, MMM, no rhinorrhea  Neck: Soft, supple, no JVD with a JVP 8  Lungs: Clear to auscultation bilaterally. No wheezes, rales, or rhonchi.  Cardiac: Regular rate and rhythm, +S1S2, no murmurs apparent  Abdomen: Soft, nontender, +bowel sounds  Extremities: Moves all extremities x 4, no lower extremity edema  Neurologic: No focal motor deficits apparent, normal mood and affect  Peripheral Pulses: Intact posterior tibial pulses bilaterally          Laboratory Tests:  Reviewed  hypoK -repleting      Cardiac Tests:  ECG: reviewed      Telemetry findings reviewed: see above      Chest X-ray: reviewed      Echocardiogram: personally interpreted          Current Medications:  Current  Facility-Administered Medications   Medication Dose Route Frequency Provider Last Rate Last Dose   ??? mineral oil-hydrophilic petrolatum (HYDROPHOR) ointment   Topical BID PRN Minda Dittoobert J Moser, MD       ??? dextrose 5 % and 0.45 % NaCl with KCl 20 mEq infusion   Intravenous Continuous Minda Dittoobert J Moser, MD 100 mL/hr at 04/06/16 [id number]     ??? sodium chloride flush 0.9 % injection 10 mL  10 mL Intravenous 2 times per day Encarnacion Chuhomas A Bailey, MD   10 mL at 04/05/16 1947   ??? sodium chloride flush 0.9 % injection 10 mL  10 mL Intravenous PRN Encarnacion Chuhomas A Bailey, MD       ??? trimethobenzamide Dorothey Baseman(TIGAN) injection 200 mg  200 mg Intramuscular Q6H PRN Encarnacion Chuhomas A Bailey, MD       ??? ipratropium-albuterol (DUONEB) nebulizer solution 1  ampule  1 ampule Inhalation Q4H PRN Encarnacion Chuhomas A Bailey, MD       ??? heparin (porcine) injection 4,000 Units  4,000 Units Intravenous PRN Warnell ForesterHayah Kassis-George, MD       ??? heparin (porcine) injection 2,000 Units  2,000 Units Intravenous PRN Warnell ForesterHayah Kassis-George, MD       ??? heparin 25,000 units in dextrose 5% 250 mL infusion  12 Units/kg/hr Intravenous Continuous Henrietta Cieslewicz Kassis-George, MD 4.3 mL/hr at 04/05/16 0800 6 Units/kg/hr at 04/05/16 0800   ??? acetaminophen (TYLENOL) suppository 650 mg  650 mg Rectal Q4H PRN Encarnacion Chuhomas A Bailey, MD         ??? dextrose 5% and 0.45% NaCl with KCl 20 mEq 100 mL/hr at 04/06/16 [id number]   ??? heparin (porcine) 6 Units/kg/hr (04/05/16 0800)           ASSESSMENT/RECOMMENDATIONS:  1. VT arrest attributed to possible hypokalemia related bradycardia and QT prolongation  2. NSTEMI, type 1 versus type 2  3. Dysphagia  4. Normal biventricular function on transthoracic echocardiogram  5. Dementia  6. AFIB, CHADS vasc 5  7. COPD  8. GERD  9. Hypothyroidism       1. Patient presented with recurrent ventricular tachycardia, with infarct pattern on ECG, and on recent ECG.  2. We attributed the VT to bradycardia induced likely by malnourishment related hypokalemia. We did consider ischemia induced bradycardia.  3.  Therefore, there is no doubt she needs ischemia risk stratification/evaluation.  4. However, she is at the point where she cannot take anything by mouth. She is awaiting EGD. If we took her to the cath lab and she needed intervention she would not be able to ingest her dual antiplatelets. Therefore we are deferring LHC at this time. She is clinically stable, chest pain free, and TTE done yesterday with perfect biventricular function and specifically no wall motion abnormalities.   We may consider MPI as her ischemia eval and avoid LHC altogether given demention, functional decline, malnourishment. Along those same lines, if we suspect that life span will not reach 1 year from now, then class III indication for ICD implantation even as secondary prevention.       All the above was discussed with EP - Drs. Liane ComberKatz and Gemma, and discussed with interventional cardiology. Overall, after my discussion with family on Saturday about code status, it seems that they are not being realistic about the overall long term goals for her. She is an [age]ogenarian with dementia, malnourishment, and now major arrhythmia history. Suggest ongoing goals of care discussions with her and her family, with consideration of the involvement of palliative care. With that said, we are willing to proceed to ischemia risk stratification with MPI if patient's family continues to want and pursue this.       5. Discontinue heparin gtt  6. Since she is taking nothing by mouth cannot initiate any ACS/CAD drugs.  7. Eventually needs BB, +/- ACE I/ARB, and needs aspirin + statin  8. MPI   9. Heparin 5000 SQ BID  10. PPI PPX - pantoprazole 40 mg IV qd        Liam RogersHayah M. Kassis-George, M.D.  Advanced Heart Failure and Pulmonary Hypertension  Mobile [phone]

## 2016-04-06 NOTE — Progress Notes (Signed)
Subjective:    The patient is awake and alert.  No problems overnight.  Denies chest pain, angina, and dyspnea.  Denies abdominal pain.  NPO for EGD.  No nausea or vomiting.    Objective:    BP (!) 127/51    Pulse 51    Temp 98.4 ??F (36.9 ??C) (Temporal)    Resp 20    Ht 5\' 5"  (1.651 m)    Wt 160 lb (72.6 kg)    SpO2 90%    BMI 26.63 kg/m??     Heart:  RRR, no murmurs, gallops, or rubs.  Lungs:  CTA bilaterally, no wheeze, rales or rhonchi  Abd: bowel sounds present, nontender, nondistended, no masses  Extrem:  No clubbing, cyanosis, or edema    CBC with Differential:    Lab Results   Component Value Date    WBC 11.5 04/06/2016    RBC 4.03 04/06/2016    HGB 12.1 04/06/2016    HCT 37.2 04/06/2016    PLT 123 04/06/2016    MCV 92.3 04/06/2016    MCH 30.0 04/06/2016    MCHC 32.5 04/06/2016    RDW 16.4 04/06/2016    LYMPHOPCT 18.0 04/06/2016    MONOPCT 11.3 04/06/2016    BASOPCT 0.2 04/06/2016    MONOSABS 1.30 04/06/2016    LYMPHSABS 2.07 04/06/2016    EOSABS 0.06 04/06/2016    BASOSABS 0.02 04/06/2016     CMP:    Lab Results   Component Value Date    NA 136 04/06/2016    K 3.9 04/06/2016    CL 101 04/06/2016    CO2 26 04/06/2016    BUN 11 04/06/2016    CREATININE 0.5 04/06/2016    GFRAA >60 04/06/2016    LABGLOM >60 04/06/2016    GLUCOSE 94 04/06/2016    PROT 4.8 04/06/2016    LABALBU 2.1 04/06/2016    CALCIUM 7.7 04/06/2016    BILITOT 2.8 04/06/2016    ALKPHOS 227 04/06/2016    AST 102 04/06/2016    ALT 44 04/06/2016     BMP:    Lab Results   Component Value Date    NA 136 04/06/2016    K 3.9 04/06/2016    CL 101 04/06/2016    CO2 26 04/06/2016    BUN 11 04/06/2016    LABALBU 2.1 04/06/2016    CREATININE 0.5 04/06/2016    CALCIUM 7.7 04/06/2016    GFRAA >60 04/06/2016    LABGLOM >60 04/06/2016    GLUCOSE 94 04/06/2016     Magnesium:    Lab Results   Component Value Date    MG 2.1 04/06/2016     Phosphorus:    Lab Results   Component Value Date    PHOS 2.6 02/23/2016     PT/INR:    Lab Results   Component Value Date     PROTIME 22.6 04/03/2016    INR 2.0 04/03/2016     PTT:    Lab Results   Component Value Date    APTT 55.8 04/06/2016   [APTT}     Assessment:    Patient Active Problem List   Diagnosis   ??? Moderate protein-calorie malnutrition (HCC)   ??? V-tach [facility](HCC)   ??? COPD (chronic obstructive pulmonary disease) (HCC)   ??? NSTEMI (non-ST elevated myocardial infarction) (HCC)   ??? CAD (coronary artery disease), native coronary artery   ??? Hyperlipidemia LDL goal <100   ??? PUD (peptic ulcer disease)   ??? Dementia   ???  Acquired hypothyroidism   ??? Hypokalemia   ??? Persistent atrial fibrillation (HCC)   ??? Esophageal ring       Plan:  Supplement diet.  EP on board.  Continue breathing treatments and supplemental oxygne.  Cardiology on board.  Early conservative therapy chosen.  Continue medical management of ascad.  Continue aggressive lipid therapy.  PPI therapy.  Stable.  Continue synthroid.  Supplemented.  Rate controlled.  GI evaluation appreciated.  For EGD today.    Pt/Ot evaluations for discharge planning.    Minda Dittoobert J Kris Burd  MD  8:23 AM  04/06/2016

## 2016-04-06 NOTE — Procedures (Signed)
Gay FillerRuth E Leisner is a 82 y.o. female patient.  1. V-tach (HCC)    2. Hypokalemia      Past Medical History:   Diagnosis Date   ??? A-fib (HCC)    ??? Anticoagulated     on pradaxa   ??? Asthma    ??? COPD (chronic obstructive pulmonary disease) (HCC)    ??? Depression    ??? Forgetfulness     capable of signing own consents   ??? GERD (gastroesophageal reflux disease)    ??? HOH (hard of hearing)     no aides   ??? Hyperlipidemia    ??? Pain     bilateral upper quadrant /  for EGD   ??? Restless leg syndrome    ??? Thyroid disease      Blood pressure (!) 143/49, pulse 64, temperature 98.4 ??F (36.9 ??C), temperature source Temporal, resp. rate 19, height 5\' 5"  (1.651 m), weight 160 lb (72.6 kg), SpO2 (!) 89 %.    ProceduresEGD @ bedside CVIC  Indication: possible esophageal foreign body  LMAC    Moderately tight ring but accommodates adult endoscope and no fb    Duodenal ulcer x 4    OK liquids, if a problem have speech pathology return    IV ranitidine until PO reestablished.    Janit Paganichard J Shanin Szymanowski, MD  04/06/2016

## 2016-04-07 LAB — CBC WITH AUTO DIFFERENTIAL
Basophils %: 0.1 % (ref 0.0–2.0)
Basophils Absolute: 0.01 E9/L (ref 0.00–0.20)
Eosinophils %: 0.4 % (ref 0.0–6.0)
Eosinophils Absolute: 0.04 E9/L — ABNORMAL LOW (ref 0.05–0.50)
Hematocrit: 40 % (ref 34.0–48.0)
Hemoglobin: 12.9 g/dL (ref 11.5–15.5)
Immature Granulocytes #: 0.05 E9/L
Immature Granulocytes %: 0.5 % (ref 0.0–5.0)
Lymphocytes %: 16 % — ABNORMAL LOW (ref 20.0–42.0)
Lymphocytes Absolute: 1.46 E9/L — ABNORMAL LOW (ref 1.50–4.00)
MCH: 30.2 pg (ref 26.0–35.0)
MCHC: 32.3 % (ref 32.0–34.5)
MCV: 93.7 fL (ref 80.0–99.9)
MPV: 11.6 fL (ref 7.0–12.0)
Monocytes %: 11.1 % (ref 2.0–12.0)
Monocytes Absolute: 1.02 E9/L — ABNORMAL HIGH (ref 0.10–0.95)
Neutrophils %: 71.9 % (ref 43.0–80.0)
Neutrophils Absolute: 6.57 E9/L (ref 1.80–7.30)
Platelets: 130 E9/L (ref 130–450)
RBC: 4.27 E12/L (ref 3.50–5.50)
RDW: 16.9 fL — ABNORMAL HIGH (ref 11.5–15.0)
WBC: 9.2 E9/L (ref 4.5–11.5)

## 2016-04-07 LAB — BASIC METABOLIC PANEL
Anion Gap: 11 mmol/L (ref 7–16)
BUN: 9 mg/dL (ref 8–23)
CO2: 24 mmol/L (ref 22–29)
Calcium: 7.7 mg/dL — ABNORMAL LOW (ref 8.6–10.2)
Chloride: 103 mmol/L (ref 98–107)
Creatinine: 0.5 mg/dL (ref 0.5–1.0)
GFR African American: >60
GFR Non-African American: >60 mL/min/{1.73_m2} (ref >=60)
Glucose: 102 mg/dL (ref 74–109)
Potassium: 4 mmol/L (ref 3.5–5.0)
Sodium: 138 mmol/L (ref 132–146)

## 2016-04-07 MED FILL — FAMOTIDINE 20 MG/2ML IV SOLN: 20 MG/2ML | INTRAVENOUS | Qty: 2

## 2016-04-07 MED FILL — KCL IN DEXTROSE-NACL 20-5-0.45 MEQ/L-%-% IV SOLN: INTRAVENOUS | Qty: 1000

## 2016-04-07 MED FILL — HEPARIN SODIUM (PORCINE) 10000 UNIT/ML IJ SOLN: 10000 UNIT/ML | INTRAMUSCULAR | Qty: 1

## 2016-04-07 MED FILL — NORMAL SALINE FLUSH 0.9 % IV SOLN: 0.9 % | INTRAVENOUS | Qty: 30

## 2016-04-07 NOTE — Progress Notes (Signed)
Dr. Maple HudsonMoser paged regarding plan of care and orders. Awaiting a call back.

## 2016-04-07 NOTE — Progress Notes (Signed)
Palliative Medicine LSW    Whom interviewed:   Pt seen at bedside, she opens her eyes to her name being called and tries to speak but words are very mumbled. Telephone call placed to her daughter Terrence DupontCarol  Contact/ Relation: daughter Tommas OlpCarol Christoff [phone]   Marital Status: widow  Veteran Status: unknown  DPOA-HC/ Relation :   none on file, daughter [name] listed as next of kin  Living will:   none on file, full code.   Guardian:    no  Primary Language:   English       Living Situation:   Pt resides with her daughter [name], she requires assistance with adls , uses a ww and has HHC aides. Granddaughter assists with   Support System:   daughter and granddaughter locally are  supportive, 1 son [place] from [place] , and another [name] has nothing to do with pt.     Education/Employment: not employed    Journalist, newspaperinancial/Insurance Resources: [employer]    Prescription: [employer]    Psychosocial:     Set designerBehavioral Health Issues: diagnosis of depression per chart  Substance Use/ Abuse   Tobacco:   denies  Alcohol:   denies  Illicit Drug:   denies  Treatment History:  denies      Barriers/Needs:   Language:  no  Learning Deficit:  no   Homelessness:   no  Lacks Finances:  no   Uninsured/Underinsured:  no  Field seismologistLacks Transportation:  no    Past/Present Tourist information centre managerCommunity Resourceshas hhc aides.     Assessment   Pt seen at bedside for Palliative Medicine Psychosocial support while hospitalized for AMS, recurrent ventricular tachycardia, hypokalemia. She is in icu, has had an egd and is not eating. She has a history of COPD, dementia. Telephone call placed to daughter to discuss goals of care.  Daughter plans to take pt home upon discharge.   LSW provided psychosocial support through active listening and validation of feelings for caregiver support as daughter [name] and granddaughter are her only local support and main caretakers.  Daughter unsure if she will be tonight  or tomorrow due to the weather.

## 2016-04-07 NOTE — Plan of Care (Signed)
Problem: Falls - Risk of  Goal: Absence of falls  Outcome: Met This Shift      Problem: Nutrition  Goal: Optimal nutrition therapy  Outcome: Not Met This Shift  Patient refusing to drink or eat.

## 2016-04-07 NOTE — Progress Notes (Signed)
Patient has a wound care consult for skin issues. Left message for wound care nurse for recommendations regarding skin care.

## 2016-04-07 NOTE — Progress Notes (Signed)
Subjective:    The patient is awake and alert.  Status post EGD (04/06/2016).  Denies chest pain, angina, and dyspnea.  Denies abdominal pain.  Tolerating diet.  No nausea or vomiting.    Objective:    BP (!) 132/56    Pulse 73    Temp 98.2 ??F (36.8 ??C)    Resp 24    Ht 5\' 5"  (1.651 m)    Wt 160 lb (72.6 kg)    SpO2 94%    BMI 26.63 kg/m??     Heart:  RRR, no murmurs, gallops, or rubs.  Lungs:  CTA bilaterally, no wheeze, rales or rhonchi  Abd: bowel sounds present, nontender, nondistended, no masses  Extrem:  No clubbing, cyanosis, or edema    CBC with Differential:    Lab Results   Component Value Date    WBC 9.2 04/07/2016    RBC 4.27 04/07/2016    HGB 12.9 04/07/2016    HCT 40.0 04/07/2016    PLT 130 04/07/2016    MCV 93.7 04/07/2016    MCH 30.2 04/07/2016    MCHC 32.3 04/07/2016    RDW 16.9 04/07/2016    LYMPHOPCT 16.0 04/07/2016    MONOPCT 11.1 04/07/2016    BASOPCT 0.1 04/07/2016    MONOSABS 1.02 04/07/2016    LYMPHSABS 1.46 04/07/2016    EOSABS 0.04 04/07/2016    BASOSABS 0.01 04/07/2016     CMP:    Lab Results   Component Value Date    NA 138 04/07/2016    K 4.0 04/07/2016    CL 103 04/07/2016    CO2 24 04/07/2016    BUN 9 04/07/2016    CREATININE 0.5 04/07/2016    GFRAA >60 04/07/2016    LABGLOM >60 04/07/2016    GLUCOSE 102 04/07/2016    PROT 4.8 04/06/2016    LABALBU 2.1 04/06/2016    CALCIUM 7.7 04/07/2016    BILITOT 2.8 04/06/2016    ALKPHOS 227 04/06/2016    AST 102 04/06/2016    ALT 44 04/06/2016     BMP:    Lab Results   Component Value Date    NA 138 04/07/2016    K 4.0 04/07/2016    CL 103 04/07/2016    CO2 24 04/07/2016    BUN 9 04/07/2016    LABALBU 2.1 04/06/2016    CREATININE 0.5 04/07/2016    CALCIUM 7.7 04/07/2016    GFRAA >60 04/07/2016    LABGLOM >60 04/07/2016    GLUCOSE 102 04/07/2016     Magnesium:    Lab Results   Component Value Date    MG 2.1 04/06/2016     Phosphorus:    Lab Results   Component Value Date    PHOS 2.6 02/23/2016     PT/INR:    Lab Results   Component Value Date     PROTIME 22.6 04/03/2016    INR 2.0 04/03/2016     PTT:    Lab Results   Component Value Date    APTT 55.8 04/06/2016   [APTT}     Assessment:    Patient Active Problem List   Diagnosis   ??? Moderate protein-calorie malnutrition (HCC)   ??? V-tach [facility](HCC)   ??? COPD (chronic obstructive pulmonary disease) (HCC)   ??? NSTEMI (non-ST elevated myocardial infarction) (HCC)   ??? CAD (coronary artery disease), native coronary artery   ??? Hyperlipidemia LDL goal <100   ??? PUD (peptic ulcer disease)   ??? Dementia   ???  Acquired hypothyroidism   ??? Hypokalemia   ??? Persistent atrial fibrillation (HCC)   ??? Esophageal ring       Plan:  Supplement diet.  EP on board.  Continue breathing treatments and supplemental oxygne.  Cardiology on board.  Early conservative therapy chosen.  Continue medical management of ascad.  Continue aggressive lipid therapy.  PPI therapy.  Stable.  Continue synthroid.  Supplemented.  Rate controlled.  GI evaluation appreciated.  Status post EGD.    Pt/Ot evaluations for discharge planning.    Minda Dittoobert J Yuliet Needs  MD  11:49 AM  04/07/2016

## 2016-04-07 NOTE — Plan of Care (Signed)
Problem: Falls - Risk of  Goal: Absence of falls  Outcome: Met This Shift      Problem: Risk for Impaired Skin Integrity  Goal: Tissue integrity - skin and mucous membranes  Structural intactness and normal physiological function of skin and  mucous membranes.   Outcome: Not Met This Shift  Worsening perianal redness/excoriation from frequent BMs. FMS inserted

## 2016-04-07 NOTE — Other (Signed)
Pt having frequent liquid, mucus stool. Perianal area red, excoriated and worsening. FMS inserted with pt on left side. No signs of impaction, positive rectal tone. Inflated with 45cc NS and irrigated with 60cc. Draining liquid light brown.

## 2016-04-07 NOTE — Progress Notes (Signed)
Per Dr. Lidia CollumKassis-George, ok to transfer tele.

## 2016-04-07 NOTE — Progress Notes (Signed)
Advanced Heart Failure and Pulmonary Hypertension  Critical Care Progress Note              Patient ID:  Recurrent VT, NSTEMI, esophageal obstruction        24-hour events:  -no acute overnight events  -EGD 04/06/2016: Distal esophageal ring without foreign body. Unremarkable stomach. Three duodenal ulcers      Telemetry:  Nsr      Invasive Hemodynamics:  none      Inotropes, Pressors, and Intravenous Therapy:  Isuprel at 2 mcg/min --> OFF      VENT settings:   none      Mechanical circulatory support device:   none      BP 135/84    Pulse 78    Temp 98.8 ??F (37.1 ??C)    Resp 21    Ht 5\' 5"  (1.651 m)    Wt 160 lb (72.6 kg)    SpO2 94%    BMI 26.63 kg/m??   Wt Readings from Last 3 Encounters:   04/05/16 160 lb (72.6 kg)   02/26/16 156 lb 9.6 oz (71 kg)   02/28/15 181 lb (82.1 kg)     Physical Exam:  BP 135/84    Pulse 78    Temp 98.8 ??F (37.1 ??C)    Resp 21    Ht 5\' 5"  (1.651 m)    Wt 160 lb (72.6 kg)    SpO2 94%    BMI 26.63 kg/m??   Wt Readings from Last 3 Encounters:   04/05/16 160 lb (72.6 kg)   02/26/16 156 lb 9.6 oz (71 kg)   02/28/15 181 lb (82.1 kg)     Appearance: Awake, alert, no acute respiratory distress  Skin: Intact, no rash  Head: Normocephalic, atraumatic  Eyes: EOMI, no conjunctival erythema, anicteric sclerae  ENMT: No pharyngeal erythema, MMM, no rhinorrhea  Neck: Soft, supple, no JVD with a JVP 8  Lungs: Clear to auscultation bilaterally. No wheezes, rales, or rhonchi.  Cardiac: Regular rate and rhythm, +S1S2, no murmurs apparent  Abdomen: Soft, nontender, +bowel sounds  Extremities: Moves all extremities x 4, no lower extremity edema  Neurologic: No focal motor deficits apparent, normal mood and affect  Peripheral Pulses: Intact posterior tibial pulses bilaterally          Laboratory Tests:  Reviewed        Cardiac Tests:  ECG:A Fib with CVR      Telemetry findings reviewed: see above      Chest X-ray: NA        04/06/2016 EGD:  1.  Distal esophageal ring without foreign body.  2.   Unremarkable stomach.  3.  Three duodenal ulcers          Current Medications:  Current Facility-Administered Medications   Medication Dose Route Frequency Provider Last Rate Last Dose   ??? heparin (porcine) injection 5,000 Units  5,000 Units Subcutaneous BID Warnell Forester, MD   5,000 Units at 04/07/16 0904   ??? sodium chloride (PF) 0.9 % injection 10 mL  10 mL Intravenous Daily Hayah Kassis-George, MD   10 mL at 04/06/16 0857   ??? famotidine (PEPCID) injection 20 mg  20 mg Intravenous Q12H Janit Pagan, MD   20 mg at 04/07/16 4540   ??? mineral oil-hydrophilic petrolatum (HYDROPHOR) ointment   Topical BID PRN Minda Ditto, MD       ??? dextrose 5 % and 0.45 % NaCl with KCl 20 mEq infusion   Intravenous  Continuous Minda Dittoobert J Moser, MD 100 mL/hr at 04/07/16 [id number]     ??? sodium chloride flush 0.9 % injection 10 mL  10 mL Intravenous 2 times per day Encarnacion Chuhomas A Bailey, MD   10 mL at 04/07/16 0905   ??? sodium chloride flush 0.9 % injection 10 mL  10 mL Intravenous PRN Encarnacion Chuhomas A Bailey, MD       ??? trimethobenzamide Dorothey Baseman(TIGAN) injection 200 mg  200 mg Intramuscular Q6H PRN Encarnacion Chuhomas A Bailey, MD       ??? ipratropium-albuterol (DUONEB) nebulizer solution 1 ampule  1 ampule Inhalation Q4H PRN Encarnacion Chuhomas A Bailey, MD       ??? acetaminophen (TYLENOL) suppository 650 mg  650 mg Rectal Q4H PRN Encarnacion Chuhomas A Bailey, MD         ??? dextrose 5% and 0.45% NaCl with KCl 20 mEq 100 mL/hr at 04/07/16 [id number]           ASSESSMENT/RECOMMENDATIONS:  1. VT arrest attributed to possible hypokalemia related bradycardia and QT prolongation. K supplemented without recurrence of NSVT  2. NSTEMI, type 1 versus type 2  3. Dysphagia S/p EGD 04/06/2016>>three duodenal ulcers, no foreign body present  4. Normal biventricular function on transthoracic echocardiogram  5. Dementia  6. AFIB, CHADS vasc 5  7. COPD  8. GERD  9. Hypothyroidism       1. Patient presented with recurrent ventricular tachycardia, with infarct pattern on ECG, and on recent ECG.  2. We attributed the VT  to bradycardia induced likely by malnourishment related hypokalemia. We did consider ischemia induced bradycardia.  3. Therefore, there is no doubt she needs ischemia risk stratification/evaluation.  4. However, she is at the point is has been advanced to clear liquids but is refusing to eat or drink Speech therapy is being notified for possible retesting (failed swallow evaluation initially) If we took her to the cath lab and she needed intervention she possibly would not be able to ingest her dual antiplatelets. Therefore we are deferring LHC at this time. She is clinically stable, chest pain free, and TTE done 04/05/2016 with perfect biventricular function and specifically no wall motion abnormalities.   We may consider MPI as her ischemia eval and avoid LHC altogether given demention, functional decline, malnourishment. Along those same lines, if we suspect that life span will not reach 1 year from now, then class III indication for ICD implantation even as secondary prevention.       All the above was discussed with EP - Drs. Liane ComberKatz and Gemma, and discussed with interventional cardiology. Overall, after my discussion with family on Saturday about code status, it seems that they are not being realistic about the overall long term goals for her. She is an octogenarian with dementia, malnourishment, and now major arrhythmia history. Suggest ongoing goals of care discussions with her and her family, with consideration of the involvement of palliative care. With that said, we are willing to proceed to ischemia risk stratification with MPI if patient's family continues to want and pursue this.   Palliative Care has been consulted by Dr. Maple HudsonMoser    6. Since she is taking nothing by mouth cannot initiate any ACS/CAD drugs.  7. Eventually needs BB, +/- ACE I/ARB, and needs aspirin + statin  8. MPI   9. Continue Heparin 5000 SQ BID  10. Pepcid as per GI   11. May transfer to step down telemetry monitored unit from Cardiology  standpoint.   12. Will discuss case with Dr. Lidia CollumKassis-George  Electronically signed by Kallie Locksebecca Dael Howland, NP-C on 04/07/2016 at 2:50 PM

## 2016-04-07 NOTE — Progress Notes (Signed)
Dr. Maple HudsonMoser returned the page. Ok to transfer the patient if everyone on the patient's case agrees. Will follow the patient's order of a diet.

## 2016-04-07 NOTE — Op Note (Signed)
[name]                   [address] [place], [place] [id number]                                 OPERATIVE REPORT    PATIENT NAME: [name], [name]                     DOB:        December 01, 1927  MED REC NO:   [id number]                            ROOM:       [id number]  ACCOUNT NO:   [id number]                          ADMIT DATE: 04/03/2016  PROVIDER:     Atilano Inaichard Luken Shadowens, MD    DATE OF PROCEDURE:  04/06/2016    PROCEDURE:  Esophagogastroduodenoscopy.    PREOPERATIVE DIAGNOSES:  Dysphagia, rule out esophageal foreign body.    POSTOPERATIVE DIAGNOSES:  1.  Distal esophageal ring without foreign body.  2.  Unremarkable stomach.  3.  Three duodenal ulcers.    INDICATIONS:  This patient is an 82 year old woman.  She presented after an  arrest from ventricular tachycardia.  She gave a history of an inability to  swallow for about three to four days, acute onset and endoscopic  evaluations previously had demonstrated a distal esophageal ring.  She was  noted by Speech Pathology to regurgitate small amounts of clear fluids when  challenged with small amounts of water.  She refused to try additional  fluid 36 hours after being admitted.  So, an endoscopic evaluation is being  performed to exclude the distal esophageal foreign body.  She underwent  laparoscopic repair of a perforated ulcer in 01/2016.    Preop is monitored anesthesia care.    OPERATIVE PROCEDURE:  With her in the left lateral decubitus position, she  was sedated.  A bite-block was in place.  The Olympus GIF-H180 video  endoscope was guided into the cervical esophagus under direct vision.  The  esophagus was devoid of content.  No fluid or obstruction.  There was a  moderately tight ring through which the endoscope advanced without  difficulty.  There was a hiatal hernia.  The stomach was unremarkable.   There were three duodenal ulcers.  One in the mid bulb and in the left  lateral wall, another was right lateral  and the other was a little bit more  distal in the anterior.  All small.  My understanding that it was an antral  ulcer that perforated.  There was no ulcer disease within the antrum.  The  endoscope was withdrawn and the procedure terminated and well tolerated.    IMPRESSION:  She came to the hospital on pantoprazole.  She has multiple ulcers,until  oral therapy can be resumed,she will be placed on IV H2 blockers.  She  should be encouraged to drink fluids.  If she does not cooperate out of  fear, then Speech Pathology will ask to see again.        Michelle InaICHARD Jachai Okazaki, MD    D: 04/06/2016 21:24:14       T: 04/07/2016 0:57:30  [id number]  Job#: [id number]     Doc#: [id number]    CC:

## 2016-04-07 NOTE — Progress Notes (Signed)
ELECTROPHYSIOLOGY PROGRESS NOTE    LATEESHA BEZOLD  June 17, 1927  Date of Service: 04/07/2016  PCP: Otilio Saber, DO  Primary Electrophysiologist: Barnie Alderman, DO  Chief Complaint: PMVT, AF, hypokalemia        Subjective: [name] is seen in hospital follow-up. She is confused this am but denies complaints. Pt now refusing to eat or take pills. She had her EGD.     Patient Active Problem List   Diagnosis   ??? Moderate protein-calorie malnutrition (HCC)   ??? V-tach [facility])   ??? COPD (chronic obstructive pulmonary disease) (HCC)   ??? NSTEMI (non-ST elevated myocardial infarction) (HCC)   ??? CAD (coronary artery disease), native coronary artery   ??? Hyperlipidemia LDL goal <100   ??? PUD (peptic ulcer disease)   ??? Dementia   ??? Acquired hypothyroidism   ??? Hypokalemia   ??? Persistent atrial fibrillation (HCC)   ??? Esophageal ring   ??? Encounter for palliative care         Scheduled Medications:  ??? heparin (porcine)  5,000 Units Subcutaneous BID   ??? sodium chloride (PF)  10 mL Intravenous Daily   ??? famotidine (PEPCID) injection  20 mg Intravenous Q12H   ??? sodium chloride flush  10 mL Intravenous 2 times per day       Infusion Medications:  ??? dextrose 5% and 0.45% NaCl with KCl 20 mEq 100 mL/hr at 04/07/16 0332       PRN Medications:  mineral oil-hydrophilic petrolatum, sodium chloride flush, trimethobenzamide, ipratropium-albuterol, acetaminophen    Allergies   Allergen Reactions   ??? Aspirin      Reaction unknown   ??? Chicken Allergy      Allergic to poultry (eggs, chicken, [place])   ??? Eggs Or Egg-Derived Products Nausea And Vomiting   ??? Sulfa Antibiotics Nausea And Vomiting       Wt Readings from Last 3 Encounters:   04/05/16 160 lb (72.6 kg)   02/26/16 156 lb 9.6 oz (71 kg)   02/28/15 181 lb (82.1 kg)     Temp Readings from Last 3 Encounters:   04/07/16 97.7 ??F (36.5 ??C)   02/26/16 98.2 ??F (36.8 ??C) (Oral)   02/28/15 98.6 ??F (37 ??C) (Temporal)     BP Readings from Last 3 Encounters:   04/07/16 (!) 149/79   02/26/16 (!) 105/51    02/28/15 (!) 101/56     Pulse Readings from Last 3 Encounters:   04/07/16 106   02/26/16 67   02/28/15 80         Intake/Output Summary (Last 24 hours) at 04/07/16 1836  Last data filed at 04/07/16 1800   Gross per 24 hour   Intake              800 ml   Output              652 ml   Net              148 ml          Physical exam:  Constitutional: BP (!) 149/79    Pulse 106    Temp 97.7 ??F (36.5 ??C)    Resp 25    Ht 5\' 5"  (1.651 m)    Wt 160 lb (72.6 kg)    SpO2 93%    BMI 26.63 kg/m??    Gen: frail elderly female, in no acute distress   Eyes: conjunctivae normal, no xanthelasma   Ears, Nose, Throat:  oral mucosa moist, no cyanosis   Neck: supple, no JVD, no bruits, no thyromegaly   CV: normal rate, irregularly irregular rhythm, normal S1 & S2, No S3 or S4. No murmurs, rubs, or gallops. Peripheral pulses normal   Lungs: clear to auscultation bilaterally, normal respiratory effort   Abdomen: soft, non-tender, bowel sounds present, no masses or hepatomegaly   Extremities: no digital clubbing, no edema   Skin: warm, no rashes   Neuro/Psych: awake and alert, confused    ECG: AF, QT grossly normal today    Rhythm strip review: AF, rates 60-70s    Recent Labs      04/05/16   0540  04/06/16   0432  04/07/16   0523   WBC  9.7  11.5  9.2   HGB  11.0*  12.1  12.9   HCT  33.7*  37.2  40.0   MCV  91.6  92.3  93.7   PLT  134  123*  130     Recent Labs      04/05/16   1825  04/06/16   0432  04/07/16   0523   NA  135  136  138   K  5.1*  3.9  4.0   CL  103  101  103   CO2  25  26  24    BUN  13  11  9    CREATININE  0.5  0.5  0.5     No results for input(s): PROTIME, INR in the last 72 hours.  No results for input(s): CKTOTAL, CKMB, CKMBINDEX, TROPONINI in the last 72 hours.  No results for input(s): BNP in the last 72 hours.  No results for input(s): CHOL, HDL, TRIG in the last 72 hours.    Invalid input(s): CHOLHDLR, LDLCALCU  PT/INR:    Lab Results   Component Value Date    PROTIME 22.6 04/03/2016    INR 2.0 04/03/2016     TSH:     Lab Results   Component Value Date    TSH 2.900 04/03/2016       Impression:    1. PMVT arrest  - Felt to be secondary to prolonged QT interval and hypokalemia. Appreciate cardiology input re: possible ischemic evaluation.   - no recurrent PMVT, now off isoproterenol  - K+ being replaced  - Holding all QT prolonging medications: Aricept, celexa, prilosec, zofran  - Appreciate cardiology input. Recommend palliative care evaluation. ICD is not appropriate at this time given her current state, but may be reconsidered in the future if her prognosis changes.     2. Prolonged QTc  - 2/2 Aricept, celexa, prilosec in combination with hypokalemia  - QT improved  ??  3. Long-standing persistent/permanent AF  - current in AF/Fl   - was on Pradaxa as an OP.  - would use Eliquis 5 mg BID at present--not taking PO  - currently on IV heparin  ??  4. Hypokalemia  - continue replacement  ??  5. Dementia  - on Aricept as an OP  ??  6. HLD  - Zocor  ??  7. Hypothyroidism  - on synthroid replacement  ??  8. Depression  - on celexa as an OP  ??  9. COPD  ??  Recommendations:  ??  1. Discontinued Isoproterenol  2. Continue to hold aricept, celexa, and prilosec.  Also avoid all QT-prolonging medications.  3. Once she is able to take po, would recommend changing Pradaxa to Eliquis 5 mg  BID.  4. Defer ICD implantation at this time. Appreciate palliative care input.     Thank you for allowing me the opportunity to participate in the care of your patient.     Marylene BuergerLee W. Kemontae Dunklee, MD, [facility]  Cardiac Electrophysiology  [place] [facility]  04/07/2016

## 2016-04-07 NOTE — Progress Notes (Signed)
Palliative med was consulted on this pt @ 1522 pm.

## 2016-04-07 NOTE — Progress Notes (Signed)
Per Dr. Domenic MorasGemma, ok to transfer to tele.

## 2016-04-07 NOTE — Discharge Instructions (Signed)
Continuity of Care Form    Patient Name: [name]   DOB:  09/23/1927  MRN:  [id number]    Admit date:  04/03/2016  Discharge date:  ***    Code Status Order: Full Code   Advance Directives: {YES OR NO:20022}    Admitting Physician:  Minda Dittoobert J Aryana Wonnacott, MD  PCP: Otilio SaberJoseph J Gallo, DO    Discharging Nurse: [facility]  Discharging Hospital Unit/Room#: 3819/3819-PR  Discharging Unit Phone Number: ***    Emergency Contact:   Contact 1: Name: carol christoff  Contact 1: Number: [phone]  Contact 1: Relationship: daughter    Past Surgical History:  Past Surgical History:   Procedure Laterality Date   . ABDOMEN SURGERY  02/19/2016    laprascopic with graham patch of gastric ulcer   . BACK SURGERY  2010?    lumbar    . BREAST SURGERY      Lumpectomy   . ENDOSCOPY, COLON, DIAGNOSTIC  02/28/2015   . HYSTERECTOMY  1970's   . JOINT REPLACEMENT Bilateral 2011?    knee   . VARICOSE VEIN SURGERY Bilateral 2012       Immunization History:   There is no immunization history for the selected administration types on file for this patient.    Active Problems:  Patient Active Problem List   Diagnosis Code   . Moderate protein-calorie malnutrition (HCC) E44.0   . V-tach (HCC) I47.2   . COPD (chronic obstructive pulmonary disease) (HCC) J44.9   . NSTEMI (non-ST elevated myocardial infarction) (HCC) I21.4   . CAD (coronary artery disease), native coronary artery I25.10   . Hyperlipidemia LDL goal <100 E78.5   . PUD (peptic ulcer disease) K27.9   . Dementia F03.90   . Acquired hypothyroidism E03.9   . Hypokalemia E87.6   . Persistent atrial fibrillation (HCC) I48.1   . Esophageal ring K22.2       Isolation/Infection:   Isolation          No Isolation            Nurse Assessment:  Last Vital Signs: BP 134/70   Pulse 105   Temp 97.5 F (36.4 C)   Resp 22   Ht 5\' 5"  (1.651 m)   Wt 160 lb (72.6 kg)   SpO2 96%   BMI 26.63 kg/m     Last documented pain score (0-10 scale): Pain Level: 0 (denies)  Last Weight:   Wt Readings from Last 1 Encounters:    04/05/16 160 lb (72.6 kg)     Mental Status:  {IP PT MENTAL STATUS:20030}     IV Access:  {MH COC IV ACCESS:304088262}    Nursing Mobility/ADLs:  Walking   {CHP DME FAOZ:308657846}ADLs:304088043}  Transfer  {CHP DME NGEX:528413244}ADLs:304088043}  Bathing  {CHP DME WNUU:725366440}ADLs:304088043}  Dressing  {CHP DME HKVQ:259563875}ADLs:304088043}  Toileting  {CHP DME IEPP:295188416}ADLs:304088043}  Feeding  {CHP DME SAYT:016010932}ADLs:304088043}  Med Admin  {CHP DME TFTD:322025427}ADLs:304088043}  Med Delivery   {MH COC MED Delivery:304088264}    Wound Care Documentation and Therapy:        Elimination:  Continence:    Bowel: {YES / CW:23762}O:19727}   Bladder: {YES / GB:15176}O:19727}  Urinary Catheter: {Urinary Catheter:304088013}   Colostomy/Ileostomy/Ileal Conduit: {YES / HY:07371}O:19727}  Rectal Tube With balloon-Stool Appearance: Mucous, Watery  Rectal Tube With balloon-Stool Color: Barnett ApplebaumBrown, Tan (Comment)  Rectal Tube With balloon-Stool Amount: Small    Date of Last BM: ***    Intake/Output Summary (Last 24 hours) at 04/07/16 [id number]  Last  data filed at 04/07/16 0600   Gross per 24 hour   Intake              812 ml   Output              417 ml   Net              395 ml     I/O last 3 completed shifts:  In: 812 [I.V.:812]  Out: 432 [Urine:432]    Safety Concerns:     {MH COC Safety Concerns:304088272}    Impairments/Disabilities:      {MH COC Impairments/Disabilities:304088273}    Nutrition Therapy:  Current Nutrition Therapy:   {MH COC Diet List:304088271}    Routes of Feeding: {CHP DME Other Feedings:304088042}  Liquids: {Slp liquid thickness:30034}  Daily Fluid Restriction: {CHP DME Yes amt example:304088041}  Last Modified Barium Swallow with Video (Video Swallowing Test): {Done Not Done ZOXW:960454098}ate:304088012}    Treatments at the Time of Hospital Discharge:   Respiratory Treatments: ***  Oxygen Therapy:  {Therapy; copd oxygen:17808}  Ventilator:    {MH CC Vent JXBJ:478295621}List:304088111}    Rehab Therapies: {THERAPEUTIC INTERVENTION:[phone]}  Weight Bearing Status/Restrictions: {MH CC Weight Bearing:304508812}  Other Medical Equipment (for  information only, NOT a DME order):  {EQUIPMENT:304520077}  Other Treatments: ***    Patient's personal belongings (please select all that are sent with patient):  {CHP DME Belongings:304088044}    RN SIGNATURE:  {Esignature:304088025}    CASE MANAGEMENT/SOCIAL WORK SECTION    Inpatient Status Date: ***    [facility] Readmission Risk Assessment Score:  Risk Score: 16.5   (Score > 14= high risk for readmission)    Discharging to Facility/ Agency    Name:    Address:   Phone:   Fax:    Dialysis Facility (if applicable)    Name:   Address:   Dialysis Schedule:   Phone:   Fax:    Case Manager/Social Worker signature: {Esignature:304088025}    PHYSICIAN SECTION    Prognosis: {[phone]}    Condition at Discharge: {MH Patient Condition:304088024}    Rehab Potential (if transferring to Rehab): {Prognosis:([phone]}    Recommended Labs or Other Treatments After Discharge: ***    Physician Certification: I certify the above information and transfer of [name]  is necessary for the continuing treatment of the diagnosis listed and that she requires {Admit to Appropriate Level of Care:20763} for {GREATER/LESS:304500278} 30 days.     Update Admission H&P: {CHP DME Changes in HYQMV:784696295}HandP:304088045}    PHYSICIAN SIGNATURE: Electronically signed by Minda Dittoobert J Takeo Harts, MD on 04/07/2016 at 8:32 AM    Follow up with dr Veryl Speakjoseph gallo in 1 week.  Follow up with dr Atilano Inarichard marina in 2-3 weeks.  Follow up with dr Anselm Junglinghayah kassis-george in 2-3 weeks.  Follow up with dr Barnie Aldermanallan katz in 2-3 weeks.

## 2016-04-07 NOTE — Plan of Care (Signed)
Problem: Falls - Risk of  Goal: Absence of falls  Outcome: Met This Shift      Problem: Risk for Impaired Skin Integrity  Goal: Tissue integrity - skin and mucous membranes  Structural intactness and normal physiological function of skin and  mucous membranes.   Outcome: Met This Shift      Problem: Nutrition  Goal: Optimal nutrition therapy  Outcome: Not Met This Shift  Pt refusing to eat.

## 2016-04-07 NOTE — Consults (Signed)
Palliative Medicine   Consult Note    Provider: Louellen Molderiane Conswella Bruney RN, CNS, [facility]      Hospital Day: 5    Referring Provider: Dr. Ursula Beathobert Moser    Reason for Consult:  ____Advanced Care Planning  x____Assist with goals of care  x____Psychosocial support  ____Symptom Management    Recommendations:  1. Psychosocial support of patient and family:contacts include - spoke with daughter Michelle RegalCarol by phone;  [name] @ [phone]; [name] @ [phone] granddaughter; [place] (eldest but estranged)  2. Assist with goals of care: continue current care; likely will transfer out of the ICU soon; we talked about life support and possible artificial feeding if doesn't swallow- asked family to discuss pt's wishes; will f/u tomorrow with Michelle Regalarol  3. Advance Care Planning: none known; lives with daughter - eldest is son who is estranged; NOK is Data processing managerCarol  4. Anticipatory Guidance:arrhythmia/post cardiac arrest; dementia, malnourishment  5. Code Status: full (discussed over phone; wants to discuss with other family members)  6. Symptom Management - no acute issues at this time    Chief complaint:  VT / NSTEMI    HPI:   [name] is a 82 y.o. female with significant past medical history of A-fib (on pradaxa; asthma, COPD,, GERD, depression and dementia, HLD who presented by EMS from home with altered mental status.  Patient was found by family altered and barely responsive.  She was noted to be in VT on monitoring; was defibrillated which persisted in the hospital and  was defibrillated again.  CPR was performed.  CXR: notes cardiomegaly with pulmonary edema and a small right pleural effusion. CT of head shows no acute pathology but with chronic microvascular ischemic changes.  Cardiology and EP consulted. Pt was very confused but denied chest pain, shortness of breath, or other acute distress. [facility]   Pt was started on amiodarone; fluids, heparin and ASA but  it's reported pt not taking orally. Pt underwent EGD on 12/12- noting 3 duodenal ulcers-recommendation for IV H2 blockers. Pt lives with daughter, Michelle RegalCarol and has had HHC in the past. Pt is full code. Palliative Medicine has been consulted to assist pt/family with establishing goals of care; code status discussion; family support and symptom management.      [name] is laying in bed in no acute distress.  Unable to understand pt's mumbling and seems to slur her words when trying to talk.  States she wants to sleep.  No family present. Pt remains in A-fib. SP02 88% on r/a. Pt having frequent liquid, mucus stool per FCS.      Contacted daughter, Michelle RegalCarol by phone; explained role of PM to her. Pt has no advance directives in place; eldest [place] Who is estranged.  Michelle RegalCarol cares for her mother and takes care of most of her affairs with some help form son in [place].  We did discuss code status which Michelle RegalCarol states family needs time to consider so wants full code- continue shock/CPR and intubation if needed.  Asked to set up meeting so this can be dicussed further.  We also talked about goals - if pt unable to swallow-what to do about OAC etc.  Asked Michelle RegalCarol to call me tomorrow to set up family meeting time.  Support given.  Spoke with staff. Will follow along.       Past Medical History:   Diagnosis Date   ??? A-fib (HCC)    ???  Anticoagulated     on pradaxa   ??? Asthma    ??? COPD (chronic obstructive pulmonary disease) (HCC)    ??? Depression    ??? Forgetfulness     capable of signing own consents   ??? GERD (gastroesophageal reflux disease)    ??? HOH (hard of hearing)     no aides   ??? Hyperlipidemia    ??? Pain     bilateral upper quadrant /  for EGD   ??? Restless leg syndrome    ??? Thyroid disease        Past Surgical History:   Procedure Laterality Date   ??? ABDOMEN SURGERY  02/19/2016    laprascopic with graham patch of gastric ulcer   ??? BACK SURGERY  2010?    lumbar    ??? BREAST SURGERY      Lumpectomy   ??? ENDOSCOPY, COLON,  DIAGNOSTIC  02/28/2015   ??? HYSTERECTOMY  1970's   ??? JOINT REPLACEMENT Bilateral 2011?    knee   ??? VARICOSE VEIN SURGERY Bilateral 2012       No current facility-administered medications on file prior to encounter.      Current Outpatient Prescriptions on File Prior to Encounter   Medication Sig Dispense Refill   ??? ondansetron (ZOFRAN) 4 MG tablet Take 1 tablet by mouth every 8 hours as needed for Nausea or Vomiting 30 tablet 0   ??? omeprazole (PRILOSEC) 40 MG delayed release capsule Take 1 capsule by mouth daily 30 capsule 3   ??? MULTIPLE VITAMIN PO Take by mouth daily     ??? buPROPion (WELLBUTRIN SR) 150 MG extended release tablet Take 150 mg by mouth nightly     ??? donepezil (ARICEPT) 10 MG tablet Take 10 mg by mouth nightly     ??? simvastatin (ZOCOR) 20 MG tablet Take 20 mg by mouth nightly.       ??? levothyroxine (SYNTHROID) 50 MCG tablet Take 50 mcg by mouth daily.       ??? citalopram (CELEXA) 20 MG tablet Take 20 mg by mouth daily Instructed to take with sip water am of procedure     ??? montelukast (SINGULAIR) 10 MG tablet Take 10 mg by mouth nightly.       ??? raloxifene (EVISTA) 60 MG tablet Take 60 mg by mouth daily.       ??? pregabalin (LYRICA) 150 MG capsule Take 150 mg by mouth 2 times daily Instructed to take with sip water am of procedure     ??? digoxin (LANOXIN) 0.125 MG tablet Take 125 mcg by mouth every 48 hours Takes at night every other day     ??? dabigatran (PRADAXA) 150 MG capsule Take 150 mg by mouth 2 times daily.       ??? albuterol sulfate HFA 108 (90 BASE) MCG/ACT inhaler Inhale 2 puffs into the lungs every 6 hours as needed for Wheezing (last took this am)     ??? omeprazole (PRILOSEC) 20 MG delayed release capsule Take 2 capsules by mouth 2 times daily for 14 days 28 capsule 0   ??? ipratropium-albuterol (DUONEB) 0.5-2.5 (3) MG/3ML SOLN nebulizer solution Inhale 1 vial into the lungs every 4 hours Uses prn / instructed if needs to use dos         Allergies:    Aspirin; Chicken allergy; Eggs or egg-derived  products; and Sulfa antibiotics    Social history:  Social History     Social History   ??? Marital status:  Widowed     Spouse name: N/A   ??? Number of children: N/A   ??? Years of education: N/A     Social History Main Topics   ??? Smoking status: Never Smoker   ??? Smokeless tobacco: Never Used   ??? Alcohol use No   ??? Drug use: No   ??? Sexual activity: Not Asked     Other Topics Concern   ??? None     Social History Narrative   ??? None         Family history:  History reviewed. No pertinent family history.    ----- REVIEW OF SYSTEMS -----  Unable to answer ROS questions      Vitals:   Vitals:    04/07/16 1200 04/07/16 1300 04/07/16 1400 04/07/16 1500   BP: 135/84 128/64 (!) 127/52 (!) 146/77   Pulse: 78 104 104 91   Resp: 21 18 21 25    Temp: 98.8 ??F (37.1 ??C)  97.7 ??F (36.5 ??C)    TempSrc:       SpO2: 94% 92%  95%   Weight:       Height:           Physical Exam:    Gen:  WD, WN, lethargic; slurred speech; HOH; wants to sleep; following commands, in no acute distress  HEENT: Pupils equal and round; reactive to light  Neck: Supple  Lungs: CTA bilaterally; no audible rhonchi or wheezes noted  Heart: A-fib; RRR, no murmur, rub, or gallop noted during exam  Abd: Soft, non tender, non distended, BS+  Ext: Moving all extremities, no edema, pulses present  Skin:  Warm and dry  Neuro: Alert, oriented to self only;  following commands    Cultures:  Blood cultures negative.     Imaging:  12/9 CT Head:   Impression: ??    ??   No acute intracranial findings.    Chronic microvascular ischemic changes and parenchymal volume loss.           Impression  Principal Problem:    V-tach [facility])  Active Problems:    Moderate protein-calorie malnutrition (HCC)    COPD (chronic obstructive pulmonary disease) (HCC)    NSTEMI (non-ST elevated myocardial infarction) (HCC)    CAD (coronary artery disease), native coronary artery    Hyperlipidemia LDL goal <100    PUD (peptic ulcer disease)    Dementia    Acquired hypothyroidism    Hypokalemia    Persistent  atrial fibrillation (HCC)    Esophageal ring      Assessment/Plan:   CACHE DECOURSEY is a 82 y.o. female with VT/cardiac arrest requiring defibrillation which persisted in the hospital and  was defibrillated again.   CPR was performed. Admitted for NSTEMI. Cardiology following-may consider MPI if family chooses further aggressive care but pt has had functional decline; dementia and now refusing to take po. Has Elevated liver enzymes - EGD done noting gastric ulcers.  Pt remains full code; family to discuss further.  Family to set up meeting time to establish GOC and further discuss code status. Spoke with staff.     Asked daughter, [name] to call me tomorrow to set up family meeting time.  Full code      Louellen Molder  MSN, APRN-CNS, [facility]  Palliative Medicine    Time in minutes: 70 min    More than 50% of this interaction was related to counseling and information given r/t disease process; plan of care; and code status.  Thank you for allowing Palliative Medicine to participate in this patient's care.       Patient assessment and plan discussed with Palliative Medicine physician.

## 2016-04-07 NOTE — Progress Notes (Signed)
Dr. Jearld AdjutantMarina called back- ok to transfer- will have speech evaluate the patient again.

## 2016-04-08 LAB — CBC WITH AUTO DIFFERENTIAL
Basophils %: 0.2 % (ref 0.0–2.0)
Basophils Absolute: 0.02 E9/L (ref 0.00–0.20)
Eosinophils %: 1.3 % (ref 0.0–6.0)
Eosinophils Absolute: 0.11 E9/L (ref 0.05–0.50)
Hematocrit: 38 % (ref 34.0–48.0)
Hemoglobin: 12.2 g/dL (ref 11.5–15.5)
Immature Granulocytes #: 0.06 E9/L
Immature Granulocytes %: 0.7 % (ref 0.0–5.0)
Lymphocytes %: 24.6 % (ref 20.0–42.0)
Lymphocytes Absolute: 2.09 E9/L (ref 1.50–4.00)
MCH: 29.6 pg (ref 26.0–35.0)
MCHC: 32.1 % (ref 32.0–34.5)
MCV: 92.2 fL (ref 80.0–99.9)
MPV: 11.1 fL (ref 7.0–12.0)
Monocytes %: 14.3 % — ABNORMAL HIGH (ref 2.0–12.0)
Monocytes Absolute: 1.22 E9/L — ABNORMAL HIGH (ref 0.10–0.95)
Neutrophils %: 58.9 % (ref 43.0–80.0)
Neutrophils Absolute: 5.01 E9/L (ref 1.80–7.30)
Platelets: 119 E9/L — ABNORMAL LOW (ref 130–450)
RBC: 4.12 E12/L (ref 3.50–5.50)
RDW: 17.4 fL — ABNORMAL HIGH (ref 11.5–15.0)
WBC: 8.5 E9/L (ref 4.5–11.5)

## 2016-04-08 LAB — EKG 12-LEAD
Atrial Rate: 234 {beats}/min
Atrial Rate: 68 {beats}/min
Atrial Rate: 55 {beats}/min
P Axis: 66 degrees
Q-T Interval: 410 ms
Q-T Interval: 426 ms
Q-T Interval: 304 ms
QRS Duration: 64 ms
QRS Duration: 64 ms
QRS Duration: 78 ms
QTc Calculation (Bazett): 426 ms
QTc Calculation (Bazett): 457 ms
QTc Calculation (Bazett): 398 ms
R Axis: -10 degrees
R Axis: -10 degrees
R Axis: -11 degrees
T Axis: 16 degrees
T Axis: 93 degrees
T Axis: 128 degrees
Ventricular Rate: 60 {beats}/min
Ventricular Rate: 75 {beats}/min
Ventricular Rate: 103 {beats}/min

## 2016-04-08 LAB — BASIC METABOLIC PANEL
Anion Gap: 13 mmol/L (ref 7–16)
Anion Gap: 9 mmol/L (ref 7–16)
BUN: 8 mg/dL (ref 8–23)
BUN: 8 mg/dL (ref 8–23)
CO2: 21 mmol/L — ABNORMAL LOW (ref 22–29)
CO2: 24 mmol/L (ref 22–29)
Calcium: 7.5 mg/dL — ABNORMAL LOW (ref 8.6–10.2)
Calcium: 8.1 mg/dL — ABNORMAL LOW (ref 8.6–10.2)
Chloride: 101 mmol/L (ref 98–107)
Chloride: 102 mmol/L (ref 98–107)
Creatinine: 0.5 mg/dL (ref 0.5–1.0)
Creatinine: 0.6 mg/dL (ref 0.5–1.0)
GFR African American: >60
GFR African American: >60
GFR Non-African American: >60 mL/min/{1.73_m2} (ref >=60)
GFR Non-African American: >60 mL/min/{1.73_m2} (ref >=60)
Glucose: 104 mg/dL (ref 74–109)
Glucose: 84 mg/dL (ref 74–109)
Potassium: 7 mmol/L (ref 3.5–5.0)
Potassium: 4.5 mmol/L (ref 3.5–5.0)
Sodium: 135 mmol/L (ref 132–146)
Sodium: 135 mmol/L (ref 132–146)

## 2016-04-08 LAB — MAGNESIUM: Magnesium: 2.1 mg/dL (ref 1.6–2.6)

## 2016-04-08 LAB — CULTURE BLOOD #1: Blood Culture, Routine: 5

## 2016-04-08 LAB — CULTURE, BLOOD 2: Culture, Blood 2: 5

## 2016-04-08 LAB — POCT GLUCOSE: Meter Glucose: 120 mg/dL — ABNORMAL HIGH (ref 70–110)

## 2016-04-08 MED FILL — KCL IN DEXTROSE-NACL 20-5-0.45 MEQ/L-%-% IV SOLN: INTRAVENOUS | Qty: 1000

## 2016-04-08 MED FILL — FAMOTIDINE 20 MG/2ML IV SOLN: 20 MG/2ML | INTRAVENOUS | Qty: 2

## 2016-04-08 MED FILL — NORMAL SALINE FLUSH 0.9 % IV SOLN: 0.9 % | INTRAVENOUS | Qty: 10

## 2016-04-08 MED FILL — HEPARIN SODIUM (PORCINE) 10000 UNIT/ML IJ SOLN: 10000 UNIT/ML | INTRAMUSCULAR | Qty: 1

## 2016-04-08 MED FILL — NORMAL SALINE FLUSH 0.9 % IV SOLN: 0.9 % | INTRAVENOUS | Qty: 30

## 2016-04-08 NOTE — Progress Notes (Signed)
[facility] [phone]

## 2016-04-08 NOTE — Progress Notes (Signed)
Nutrition Assessment    Type and Reason for Visit: Reassess    Nutrition Recommendations:  Continue current diet.  Add ONS TID.  Malnutrition Assessment:  ?? Malnutrition Status: Meets the criteria for moderate malnutrition  ?? Context: Acute illness or injury  ?? Findings of the 6 clinical characteristics of malnutrition (Minimum of 2 out of 6 clinical characteristics is required to make the diagnosis of moderate or severe Protein Calorie Malnutrition based on AND/ASPEN Guidelines):  1. Energy Intake-Less than or equal to 50%, greater than or equal to 5 days    2. Weight Loss-No significant weight loss,  (x 2 months)  3. Fat Loss-Mild subcutaneous fat loss, Orbital  4. Muscle Loss-Mild muscle mass loss, Clavicles (pectoralis and deltoids)  5. Fluid Accumulation-No significant fluid accumulation,    6. Grip Strength-Not measured    Nutrition Diagnosis:   ?? Problem: Moderate malnutrition, in context of acute illness or injury  ?? Etiology: related to Cognitive or neurological impairment, Insufficient energy/nutrient consumption    ??? Signs and symptoms:  as evidenced by Intake 0-25%, Diet history of poor intake    Nutrition Assessment:  ?? Subjective Assessment: s/p code blue  ?? Nutrition-Focused Physical Findings: alert, confused, refusing to eat, + BS, + I/O, +1 pitting edema, soft, NT, ND abdomen, + I/O  ?? Wound Type: Skin Tears  ?? Current Nutrition Therapies:  ?? Oral Diet Orders: Clear Liquid   ?? Oral Diet intake: 1-25%  ?? Anthropometric Measures:  ?? Ht: 5\' 5"  (165.1 cm)   ?? Current Body Wt: 173 lb (78.5 kg) (12/14)  ?? Admission Body Wt: 157 lb (71.2 kg) (12/9 first measured)  ?? Usual Body Wt: 156 lb (70.8 kg) (EMR actual 02/09/16)  ?? % Weight Change: weight gain noted, edema noted. + I/O,     ?? Ideal Body Wt: 125 lb (56.7 kg), % Ideal Body 138%  ?? BMI Classification: BMI 25.0 - 29.9 Overweight  ?? Comparative Standards (Estimated Nutrition Needs):  ?? Estimated Daily Total Kcal: 1400-1500  ?? Estimated Daily Protein  (g): 90-100    Estimated Intake vs Estimated Needs: Intake Less Than Needs    Nutrition Risk Level: Moderate    Nutrition Interventions:   Continue current diet, Start ONS (Ensure clear)  Continued Inpatient Monitoring, Education not appropriate at this time, Coordination of Care    Nutrition Evaluation:   ?? Evaluation: Goals set   ?? Goals: Consume > 50-75% of meals and supplements.    ?? Monitoring: Meal Intake, Supplement Intake, Diet Tolerance, Fluid Balance, Ascites/Edema, Weight, Comparative Standards, Pertinent Labs, Chewing/Swallowing    See Adult Nutrition Doc Flowsheet for more detail.     Electronically signed by Caryl AspKaren Brynleigh Sequeira, RD, LD on 04/08/16 at 3:11 PM    Contact Number:  Ext [phone]

## 2016-04-08 NOTE — Progress Notes (Signed)
Subjective:    The patient is awake and alert.  Very confused.  Status post EGD (04/06/2016).  Denies chest pain, angina, and dyspnea.  Denies abdominal pain.  Patient not eating.  No nausea or vomiting.    Objective:    BP (!) 141/70    Pulse 101    Temp 98.6 ??F (37 ??C)    Resp 20    Ht 5\' 5"  (1.651 m)    Wt 173 lb 3.2 oz (78.6 kg)    SpO2 95%    BMI 28.82 kg/m??     Heart:  RRR, no murmurs, gallops, or rubs.  Lungs:  CTA bilaterally, no wheeze, rales or rhonchi  Abd: bowel sounds present, nontender, nondistended, no masses  Extrem:  No clubbing, cyanosis, or edema    CBC with Differential:    Lab Results   Component Value Date    WBC 8.5 04/08/2016    RBC 4.12 04/08/2016    HGB 12.2 04/08/2016    HCT 38.0 04/08/2016    PLT 119 04/08/2016    MCV 92.2 04/08/2016    MCH 29.6 04/08/2016    MCHC 32.1 04/08/2016    RDW 17.4 04/08/2016    LYMPHOPCT 24.6 04/08/2016    MONOPCT 14.3 04/08/2016    BASOPCT 0.2 04/08/2016    MONOSABS 1.22 04/08/2016    LYMPHSABS 2.09 04/08/2016    EOSABS 0.11 04/08/2016    BASOSABS 0.02 04/08/2016     CMP:    Lab Results   Component Value Date    NA 135 04/08/2016    K 4.5 04/08/2016    CL 102 04/08/2016    CO2 24 04/08/2016    BUN 8 04/08/2016    CREATININE 0.6 04/08/2016    GFRAA >60 04/08/2016    LABGLOM >60 04/08/2016    GLUCOSE 84 04/08/2016    PROT 4.8 04/06/2016    LABALBU 2.1 04/06/2016    CALCIUM 8.1 04/08/2016    BILITOT 2.8 04/06/2016    ALKPHOS 227 04/06/2016    AST 102 04/06/2016    ALT 44 04/06/2016     BMP:    Lab Results   Component Value Date    NA 135 04/08/2016    K 4.5 04/08/2016    CL 102 04/08/2016    CO2 24 04/08/2016    BUN 8 04/08/2016    LABALBU 2.1 04/06/2016    CREATININE 0.6 04/08/2016    CALCIUM 8.1 04/08/2016    GFRAA >60 04/08/2016    LABGLOM >60 04/08/2016    GLUCOSE 84 04/08/2016     Magnesium:    Lab Results   Component Value Date    MG 2.1 04/08/2016     Phosphorus:    Lab Results   Component Value Date    PHOS 2.6 02/23/2016     PT/INR:    Lab Results    Component Value Date    PROTIME 22.6 04/03/2016    INR 2.0 04/03/2016     PTT:    Lab Results   Component Value Date    APTT 55.8 04/06/2016   [APTT}     Assessment:    Patient Active Problem List   Diagnosis   ??? Moderate protein-calorie malnutrition (HCC)   ??? V-tach [facility](HCC)   ??? COPD (chronic obstructive pulmonary disease) (HCC)   ??? NSTEMI (non-ST elevated myocardial infarction) (HCC)   ??? CAD (coronary artery disease), native coronary artery   ??? Hyperlipidemia LDL goal <100   ??? PUD (peptic ulcer  disease)   ??? Dementia   ??? Acquired hypothyroidism   ??? Hypokalemia   ??? Persistent atrial fibrillation (HCC)   ??? Esophageal ring   ??? Encounter for palliative care       Plan:  Supplement diet.  EP on board.  Continue breathing treatments and supplemental oxygne.  Cardiology on board.  Early conservative therapy chosen.  Continue medical management of ascad.  Continue aggressive lipid therapy.  PPI therapy.  Stable.  Continue synthroid.  Supplemented.  Rate controlled.  GI evaluation appreciated.  Status post EGD.    Pt/Ot evaluations for discharge planning.  Patient is declining.  Palliative care to speak with family.  Poor outcome expected.     [name] J Kamel Haven  MD  1:11 PM  04/08/2016

## 2016-04-08 NOTE — Plan of Care (Signed)
Problem: Nutrition  Goal: Optimal nutrition therapy  Outcome: Ongoing  Nutrition Problem: Moderate malnutrition, in context of acute illness or injury  Intervention: Food and/or Nutrient Delivery: Continue current diet, Start ONS (Ensure clear)  Nutritional Goals: Consume > 50-75% of meals and supplements.

## 2016-04-08 NOTE — Plan of Care (Signed)
Problem: Falls - Risk of  Goal: Absence of falls  Outcome: Met This Shift  Plan of care discussed with patient/family.  Patient/family incorporated into plan of care.     Problem: Risk for Impaired Skin Integrity  Goal: Tissue integrity - skin and mucous membranes  Structural intactness and normal physiological function of skin and  mucous membranes.   Outcome: Met This Shift  Plan of care discussed with patient/family.  Patient/family incorporated into plan of care.

## 2016-04-08 NOTE — Care Coordination-Inpatient (Signed)
SOCIAL WORK AND DISCHARGE PLANNING: PALLATIVE PLANS ON MEETING WITH DAUGHTER SOON. DAUGHTER WANTS TO TAKE Pt HOME. STILL WITH A FECAL MANAGEMENT SYSTEM. SPEECH GARBLED AND NOT EATING. RN TO ADDRESS THIS WITH PCP TODAY AS WELL. Abigail MiyamotoLisa Harlow Carrizales  04/08/2016

## 2016-04-08 NOTE — Progress Notes (Signed)
Patient pulling at all lines and tubes. Patient pulled out IV site. Refusing to turn or to take in anything oral at this time. Educated with not evidence of learning. Will continue to reinforce importance and monitor.   Karenann CaiKelli Waniya Hoglund, RN

## 2016-04-08 NOTE — Plan of Care (Signed)
Problem: Falls - Risk of  Goal: Absence of falls  Outcome: Ongoing

## 2016-04-08 NOTE — Progress Notes (Signed)
Palliative Medicine   Progress Note  Clinical Nurse Specialist      Hospital Day: 6    Recommendations:  1. Psychosocial support of patient and family: message left for daughter [name];   2. Assist with goals of care: continue current care; requested family to meet; daughter to contact PM when here today  3. Anticipatory Guidance:arrhythmia/post cardiac arrest; dementia, malnourishment  5. Code Status: full (discussed over phone; wants to discuss with other family members); hope to meet today to discuss further  6. Symptom Management - no acute issues at this time    CC: AMS    HPI:  [name] is a 82 y.o. female with significant past medical history of A-fib (on pradaxa; asthma, COPD,, GERD, depression and dementia, HLD who presented by EMS from home with altered mental status. ??Patient was found by family altered and barely responsive. ??She was noted to be in VT on monitoring; was defibrillated which persisted in the hospital and  was defibrillated again. ??CPR was performed. ??CXR: notes cardiomegaly with pulmonary edema and a small right pleural effusion. CT of head shows no acute pathology but with chronic microvascular ischemic changes.  Cardiology and EP consulted. Pt was very confused but denied chest pain, shortness of breath, or other acute distress. [facility]   Pt was started on amiodarone; fluids, heparin and ASA but it's reported pt not taking orally. Pt underwent EGD on 12/12- noting 3 duodenal ulcers-recommendation for IV H2 blockers. Pt lives with daughter, [name] and has had HHC in the past. Pt is full code. Palliative Medicine has been consulted to assist pt/family with establishing goals of care; code status discussion; family support and symptom management.    ??  [name] was seen. Speech garbled. No family at present. Message left for daughter; [name] on voicemail.  Need to iscuss goals of care regarding feeding issue;s ICD placement and code status. .  Pt still  refusing to take po meds and is refusing water. Has tele sitter at bedside; pt continues to be agitated and pulling at lines and monitor wires.  Spoke with staff. Will follow along      Physical Exam:  Vitals:    BP (!) 140/68    Pulse 104    Temp 97.8 ??F (36.6 ??C) (Oral)    Resp 28    Ht 5\' 5"  (1.651 m)    Wt 173 lb 3.2 oz (78.6 kg)    SpO2 93%    BMI 28.82 kg/m??   Gen:  Awake; slurred speech; HOH; following commands, in no acute distress  HEENT: Pupils equal and round; reactive to light  Neck: Supple  Lungs: CTA bilaterally; no audible rhonchi or wheezes noted  Heart: A-fib; RRR, no murmur, rub, or gallop noted during exam  Abd: Soft, non tender, non distended, BS+  Ext: Moving all extremities, no edema, pulses present  Skin:  Warm and dry  Neuro: Alert, oriented to self only;  following commands; confused  ??       ALLERGIES:Aspirin; Chicken allergy; Eggs or egg-derived products; and Sulfa antibiotics    ??? heparin (porcine)  5,000 Units Subcutaneous BID   ??? sodium chloride (PF)  10 mL Intravenous Daily   ??? famotidine (PEPCID) injection  20 mg Intravenous Q12H   ??? sodium chloride flush  10 mL Intravenous 2 times per day       mineral oil-hydrophilic petrolatum, sodium chloride flush, trimethobenzamide, ipratropium-albuterol, acetaminophen    ??? dextrose  5% and 0.45% NaCl with KCl 20 mEq 100 mL/hr at 04/08/16 0121         Labs     CBC:   Lab Results   Component Value Date    WBC 8.5 04/08/2016    RBC 4.12 04/08/2016    HGB 12.2 04/08/2016    HCT 38.0 04/08/2016    PLT 119 04/08/2016    MCV 92.2 04/08/2016     BMP:    Lab Results   Component Value Date    NA 135 04/08/2016    K 4.5 04/08/2016    CL 102 04/08/2016    CO2 24 04/08/2016    BUN 8 04/08/2016    CREATININE 0.6 04/08/2016    GLUCOSE 84 04/08/2016    CALCIUM 8.1 04/08/2016     PT/INR:    Lab Results   Component Value Date    PROTIME 22.6 04/03/2016    INR 2.0 04/03/2016         Impression:  Principal Problem:    V-tach [facility](HCC)  Active Problems:    Moderate  protein-calorie malnutrition (HCC)    COPD (chronic obstructive pulmonary disease) (HCC)    NSTEMI (non-ST elevated myocardial infarction) (HCC)    CAD (coronary artery disease), native coronary artery    Hyperlipidemia LDL goal <100    PUD (peptic ulcer disease)    Dementia    Acquired hypothyroidism    Hypokalemia    Persistent atrial fibrillation (HCC)    Esophageal ring    Encounter for palliative care        Assessment/Plan :   [name] a 82 y.o.female with VT/cardiac arrest requiring defibrillation which persisted in the hospital and  was defibrillated again.  ??CPR was performed. Admitted for NSTEMI. Cardiology following-may consider MPI if family chooses further aggressive care but pt has had functional decline; dementia and now refusing to take po. Has Elevated liver enzymes - EGD done noting gastric ulcers.  Pt remains full code; family to discuss further.  Family to set up meeting time to establish GOC and further discuss code status. Spoke with staff.  ??Asked daughter, [name] to call me tomorrow to set up family meeting time. Full code    12/14 Pt transferred out of the ICU. Still very confused/HOH/slurred speech.  Refusing meds and water. Spoke with daughter yesterday about need to address code status.  Wants full code until family discusses further.  Requested daughter call me today to discuss goals of care; states she is working but may be here later afternoon.  Pt more agitated today; pulls at lines. Requested tele sitter. Will follow along. Spoke with staff.     1545- message left on voice mail for daughter [name] since PM did not receive call back on plan to meet; again requested to set up meeting to plan GOC and discuss code status.         Michelle Molderiane Alle Difabio  MSN, APRN-CNS, [facility] 04/08/2016 10:23 AM    Time in minutes spent: 25    More than 50% of this interaction was related to counseling or information given r/t disease process; plan of care; and code status.      Thank you for allowing us to  work with you in the care of this patient.      Patient assessment and plan discussed with Palliative Medicine Physician.

## 2016-04-08 NOTE — Plan of Care (Signed)
Problem: Falls - Risk of  Goal: Absence of falls  Outcome: Met This Shift      Problem: Risk for Impaired Skin Integrity  Goal: Tissue integrity - skin and mucous membranes  Structural intactness and normal physiological function of skin and  mucous membranes.   Outcome: Met This Shift

## 2016-04-09 LAB — CBC WITH AUTO DIFFERENTIAL
Basophils %: 0.3 % (ref 0.0–2.0)
Basophils Absolute: 0.03 E9/L (ref 0.00–0.20)
Eosinophils %: 2.8 % (ref 0.0–6.0)
Eosinophils Absolute: 0.24 E9/L (ref 0.05–0.50)
Hematocrit: 39.3 % (ref 34.0–48.0)
Hemoglobin: 12.6 g/dL (ref 11.5–15.5)
Immature Granulocytes #: 0.05 E9/L
Immature Granulocytes %: 0.6 % (ref 0.0–5.0)
Lymphocytes %: 25.8 % (ref 20.0–42.0)
Lymphocytes Absolute: 2.25 E9/L (ref 1.50–4.00)
MCH: 29.5 pg (ref 26.0–35.0)
MCHC: 32.1 % (ref 32.0–34.5)
MCV: 92 fL (ref 80.0–99.9)
MPV: 11 fL (ref 7.0–12.0)
Monocytes %: 13.5 % — ABNORMAL HIGH (ref 2.0–12.0)
Monocytes Absolute: 1.18 E9/L — ABNORMAL HIGH (ref 0.10–0.95)
Neutrophils %: 57 % (ref 43.0–80.0)
Neutrophils Absolute: 4.97 E9/L (ref 1.80–7.30)
Platelets: 125 E9/L — ABNORMAL LOW (ref 130–450)
RBC: 4.27 E12/L (ref 3.50–5.50)
RDW: 17.1 fL — ABNORMAL HIGH (ref 11.5–15.0)
WBC: 8.7 E9/L (ref 4.5–11.5)

## 2016-04-09 LAB — EKG 12-LEAD
Atrial Rate: 102 {beats}/min
P Axis: 141 degrees
P-R Interval: 248 ms
Q-T Interval: 306 ms
QRS Duration: 66 ms
QTc Calculation (Bazett): 398 ms
R Axis: -10 degrees
T Axis: 50 degrees
Ventricular Rate: 102 {beats}/min

## 2016-04-09 LAB — BASIC METABOLIC PANEL
Anion Gap: 14 mmol/L (ref 7–16)
BUN: 8 mg/dL (ref 8–23)
CO2: 21 mmol/L — ABNORMAL LOW (ref 22–29)
Calcium: 7.7 mg/dL — ABNORMAL LOW (ref 8.6–10.2)
Chloride: 102 mmol/L (ref 98–107)
Creatinine: 0.6 mg/dL (ref 0.5–1.0)
GFR African American: >60
GFR Non-African American: >60 mL/min/{1.73_m2} (ref >=60)
Glucose: 78 mg/dL (ref 74–109)
Potassium: 4.3 mmol/L (ref 3.5–5.0)
Sodium: 137 mmol/L (ref 132–146)

## 2016-04-09 LAB — MAGNESIUM: Magnesium: 1.9 mg/dL (ref 1.6–2.6)

## 2016-04-09 MED ORDER — MICONAZOLE NITRATE 2 % EX OINT
2 % | Freq: Two times a day (BID) | CUTANEOUS | Status: DC | PRN
Start: 2016-04-09 — End: 2016-04-17
  Administered 2016-04-12: 11:00:00 via TOPICAL

## 2016-04-09 MED ORDER — MICONAZOLE NITRATE 2 % EX OINT
2 % | Freq: Two times a day (BID) | CUTANEOUS | Status: DC
Start: 2016-04-09 — End: 2016-04-17
  Administered 2016-04-10: 14:00:00 via TOPICAL
  Administered 2016-04-10: 02:00:00 1 via TOPICAL
  Administered 2016-04-11 – 2016-04-17 (×14): via TOPICAL

## 2016-04-09 MED FILL — KCL IN DEXTROSE-NACL 20-5-0.45 MEQ/L-%-% IV SOLN: INTRAVENOUS | Qty: 1000

## 2016-04-09 MED FILL — FAMOTIDINE 20 MG/2ML IV SOLN: 20 MG/2ML | INTRAVENOUS | Qty: 2

## 2016-04-09 MED FILL — HEPARIN SODIUM (PORCINE) 10000 UNIT/ML IJ SOLN: 10000 UNIT/ML | INTRAMUSCULAR | Qty: 1

## 2016-04-09 MED FILL — SODIUM CHLORIDE 0.9 % IJ SOLN: 0.9 % | INTRAMUSCULAR | Qty: 10

## 2016-04-09 MED FILL — ALOE VESTA ANTIFUNGAL 2 % EX OINT: 2 % | CUTANEOUS | Qty: 56

## 2016-04-09 NOTE — Care Coordination-Inpatient (Addendum)
SOCIAL WORK AND DISCHARGE PLANNING: PALLATIVE CARE RN CALLED DAUGHTER AND SHE WANTS TO PROCEED WITH AGGRESSIVE CARE OF Pt AND PLAN IS HOME. Pt HAS A FECAL MANAGEMENT SYSTEM IN PLACE AND PER RN VERY WATERY STOOL. Pt IS ACTIVE WITH [facility]. WILL TALK WITH DAUGHTER CLOSER TO DISCHARGE TO SEE IF ANY DME IS NEEDED. Abigail MiyamotoLisa Deeana Atwater  04/09/2016    GRANDDAUGHTER, CARRIE , WAS PRESENT IN THE ROOM. SHE SAID THE PLAN IS TO TAKE Pt HOME. I TOLD HER THAT Pt WILL REQUIRE 24/7 CARE. SHE SAID THAT Pt BETWEEN HER AND [facility] WOUND HAVE THE CARE NEEDED. THE ONLY DME SHE HAS IS A FWW. I ADDRESSED THE CONFUSION THAT Pt WAS HAVING AND CARRIE SAID THAT SHE FEELS THE DEMENTIA HAS GOTTEN WORSE. THEY HAD Pt IN [facility] IN THE PAST AND THAT WAS A DISASTER. I TOLD THERE WERE MANY OTHER SNF'S TO CONSIDER. WE ALSO DISCUSSED Pt NOT EATING AND THAT WOULD NEED RESOLVED ONE WAY OR ANOTHER BEFORE DISCHARGE. Abigail MiyamotoLisa Thanh Mottern  04/09/2016

## 2016-04-09 NOTE — Progress Notes (Signed)
Subjective:    The patient is awake and alert.  Very confused.  Status post EGD (04/06/2016).  Denies chest pain, angina, and dyspnea.  Denies abdominal pain.  Patient not eating.  No nausea or vomiting.    Objective:    BP (!) 151/77    Pulse 98    Temp 97 ??F (36.1 ??C) (Axillary)    Resp 22    Ht 5\' 5"  (1.651 m)    Wt 173 lb (78.5 kg)    SpO2 98%    BMI 28.79 kg/m??     Heart:  RRR, no murmurs, gallops, or rubs.  Lungs:  CTA bilaterally, no wheeze, rales or rhonchi  Abd: bowel sounds present, nontender, nondistended, no masses  Extrem:  No clubbing, cyanosis, or edema    CBC with Differential:    Lab Results   Component Value Date    WBC 8.7 04/09/2016    RBC 4.27 04/09/2016    HGB 12.6 04/09/2016    HCT 39.3 04/09/2016    PLT 125 04/09/2016    MCV 92.0 04/09/2016    MCH 29.5 04/09/2016    MCHC 32.1 04/09/2016    RDW 17.1 04/09/2016    LYMPHOPCT 25.8 04/09/2016    MONOPCT 13.5 04/09/2016    BASOPCT 0.3 04/09/2016    MONOSABS 1.18 04/09/2016    LYMPHSABS 2.25 04/09/2016    EOSABS 0.24 04/09/2016    BASOSABS 0.03 04/09/2016     CMP:    Lab Results   Component Value Date    NA 137 04/09/2016    K 4.3 04/09/2016    CL 102 04/09/2016    CO2 21 04/09/2016    BUN 8 04/09/2016    CREATININE 0.6 04/09/2016    GFRAA >60 04/09/2016    LABGLOM >60 04/09/2016    GLUCOSE 78 04/09/2016    PROT 4.8 04/06/2016    LABALBU 2.1 04/06/2016    CALCIUM 7.7 04/09/2016    BILITOT 2.8 04/06/2016    ALKPHOS 227 04/06/2016    AST 102 04/06/2016    ALT 44 04/06/2016     BMP:    Lab Results   Component Value Date    NA 137 04/09/2016    K 4.3 04/09/2016    CL 102 04/09/2016    CO2 21 04/09/2016    BUN 8 04/09/2016    LABALBU 2.1 04/06/2016    CREATININE 0.6 04/09/2016    CALCIUM 7.7 04/09/2016    GFRAA >60 04/09/2016    LABGLOM >60 04/09/2016    GLUCOSE 78 04/09/2016     Magnesium:    Lab Results   Component Value Date    MG 1.9 04/09/2016     Phosphorus:    Lab Results   Component Value Date    PHOS 2.6 02/23/2016     PT/INR:    Lab Results    Component Value Date    PROTIME 22.6 04/03/2016    INR 2.0 04/03/2016     PTT:    Lab Results   Component Value Date    APTT 55.8 04/06/2016   [APTT}     Assessment:    Patient Active Problem List   Diagnosis   ??? Moderate protein-calorie malnutrition (HCC)   ??? [facility](HCC)   ??? COPD (chronic obstructive pulmonary disease) (HCC)   ??? NSTEMI (non-ST elevated myocardial infarction) (HCC)   ??? CAD (coronary artery disease), native coronary artery   ??? Hyperlipidemia LDL goal <100   ??? PUD (peptic ulcer disease)   ???  Dementia   ??? Acquired hypothyroidism   ??? Hypokalemia   ??? Persistent atrial fibrillation (HCC)   ??? Esophageal ring   ??? Encounter for palliative care       Plan:  Supplement diet.  EP on board.  Continue breathing treatments and supplemental oxygne.  Cardiology on board.  Early conservative therapy chosen.  Continue medical management of ascad.  Continue aggressive lipid therapy.  PPI therapy.  Stable.  Continue synthroid.  Supplemented.  Rate controlled.  GI evaluation appreciated.  Status post EGD.    Pt/Ot evaluations for discharge planning.  Patient is declining.  Palliative care to speak with family.  Poor outcome expected.     Minda Dittoobert J Valor [place]ville  MD  11:38 AM  04/09/2016

## 2016-04-09 NOTE — Progress Notes (Signed)
ELECTROPHYSIOLOGY PROGRESS NOTE    [name]  1928-03-13  Date of Service: 04/09/2016  PCP: Otilio SaberJoseph J Gallo, DO  Primary Electrophysiologist: Barnie AldermanAllan Katz, DO  Chief Complaint: PMVT, AF, hypokalemia        Subjective: Michelle FillerRuth E [name] is seen in hospital follow-up. She has been transferred out of the ICU. Pallative care have arranged a meeting to discuss code status. Remains in A.fib, rates 80-100, no events noted overnight on telemetry. No family is currently present. She remains  confused.     Patient Active Problem List   Diagnosis   ??? Moderate protein-calorie malnutrition (HCC)   ??? V-tach [facility](HCC)   ??? COPD (chronic obstructive pulmonary disease) (HCC)   ??? NSTEMI (non-ST elevated myocardial infarction) (HCC)   ??? CAD (coronary artery disease), native coronary artery   ??? Hyperlipidemia LDL goal <100   ??? PUD (peptic ulcer disease)   ??? Dementia   ??? Acquired hypothyroidism   ??? Hypokalemia   ??? Persistent atrial fibrillation (HCC)   ??? Esophageal ring   ??? Encounter for palliative care         Scheduled Medications:  ??? heparin (porcine)  5,000 Units Subcutaneous BID   ??? sodium chloride (PF)  10 mL Intravenous Daily   ??? famotidine (PEPCID) injection  20 mg Intravenous Q12H   ??? sodium chloride flush  10 mL Intravenous 2 times per day       Infusion Medications:  ??? dextrose 5% and 0.45% NaCl with KCl 20 mEq 100 mL/hr at 04/09/16 0837       PRN Medications:  mineral oil-hydrophilic petrolatum, sodium chloride flush, trimethobenzamide, ipratropium-albuterol, acetaminophen    Allergies   Allergen Reactions   ??? Aspirin      Reaction unknown   ??? Chicken Allergy      Allergic to poultry (eggs, chicken, [place])   ??? Eggs Or Egg-Derived Products Nausea And Vomiting   ??? Sulfa Antibiotics Nausea And Vomiting       Wt Readings from Last 3 Encounters:   04/08/16 173 lb (78.5 kg)   02/26/16 156 lb 9.6 oz (71 kg)   02/28/15 181 lb (82.1 kg)     Temp Readings from Last 3 Encounters:   04/09/16 97.6 ??F (36.4 ??C) (Oral)   02/26/16 98.2 ??F  (36.8 ??C) (Oral)   02/28/15 98.6 ??F (37 ??C) (Temporal)     BP Readings from Last 3 Encounters:   04/09/16 (!) 154/79   02/26/16 (!) 105/51   02/28/15 (!) 101/56     Pulse Readings from Last 3 Encounters:   04/09/16 103   02/26/16 67   02/28/15 80         Intake/Output Summary (Last 24 hours) at 04/09/16 0924  Last data filed at 04/09/16 [id number]   Gross per 24 hour   Intake              697 ml   Output              500 ml   Net              197 ml          Physical exam:  Constitutional: BP (!) 154/79    Pulse 103    Temp 97.6 ??F (36.4 ??C) (Oral)    Resp 20    Ht 5\' 5"  (1.651 m)    Wt 173 lb (78.5 kg)    SpO2 96%    BMI 28.79 kg/m??  Gen: frail elderly female, in no acute distress   Eyes: conjunctivae normal, no xanthelasma   Ears, Nose, Throat: oral mucosa moist, no cyanosis   Neck: supple, no JVD, no bruits, no thyromegaly   CV: normal rate, irregularly irregular rhythm, normal S1 & S2, No S3 or S4. No murmurs, rubs, or gallops. Peripheral pulses normal   Lungs: clear to auscultation bilaterally, normal respiratory effort   Abdomen: soft, non-tender, bowel sounds present, no masses or hepatomegaly   Extremities: no digital clubbing, no edema   Skin: warm, no rashes   Neuro/Psych: awake and alert, confused    ECG:  08/07/2015 AF, QT grossly normal     Rhythm strip review: AF, rates 80-100    Recent Labs      04/07/16   0523  04/08/16   0606  04/09/16   0508   WBC  9.2  8.5  8.7   HGB  12.9  12.2  12.6   HCT  40.0  38.0  39.3   MCV  93.7  92.2  92.0   PLT  130  119*  125*     Recent Labs      04/08/16   0606  04/08/16   0903  04/09/16   0508   NA  135  135  137   K  7.0*  4.5  4.3   CL  101  102  102   CO2  21*  24  21*   BUN  8  8  8    CREATININE  0.5  0.6  0.6     No results for input(s): PROTIME, INR in the last 72 hours.  No results for input(s): CKTOTAL, CKMB, CKMBINDEX, TROPONINI in the last 72 hours.  No results for input(s): BNP in the last 72 hours.  No results for input(s): CHOL, HDL, TRIG in the last 72  hours.    Invalid input(s): CHOLHDLR, LDLCALCU  PT/INR:    Lab Results   Component Value Date    PROTIME 22.6 04/03/2016    INR 2.0 04/03/2016     TSH:    Lab Results   Component Value Date    TSH 2.900 04/03/2016       Impression:    1. PMVT arrest  - Felt to be secondary to prolonged QT interval and hypokalemia.  - no recurrent PMVT, now off isoproterenol  - K+ WNL today   - Holding all QT prolonging medications: Aricept, celexa, prilosec, zofran  - Appreciate cardiology input. Recommend palliative care evaluation. ICD is not appropriate at this time given her current state, but may be reconsidered in the future if her prognosis changes.     2. Prolonged QTc  - 2/2 Aricept, celexa, prilosec in combination with hypokalemia  - QT improved  ??  3. Long-standing persistent/permanent AF  - current in AF/Fl; rates are controlled   - was on Pradaxa as an OP.  - would use Eliquis 5 mg BID at present--not taking PO  - currently on IV heparin  ??  4. Hypokalemia  - resolved   ??  5. Dementia  - on Aricept as an OP  ??  6. HLD  - Zocor  ??  7. Hypothyroidism  - on synthroid replacement  ??  8. Depression  - on celexa as an OP  ??  9. COPD  ??  Recommendations:  ??  1. Continue to hold aricept, celexa, and prilosec.  Also avoid all QT-prolonging medications.  2. Once  she is able to take po, would recommend changing Pradaxa to Eliquis 5 mg BID.  3. Defer ICD implantation at this time. Appreciate palliative care input.     Thank you for allowing me the opportunity to participate in the care of your patient.     Carollee LeitzAngela Saber Dickerman APRN-CNP  [facility] Cardiac Electrophysiology  [facility] [place] [facility]

## 2016-04-09 NOTE — Progress Notes (Signed)
Palliative Medicine   Progress Note  Clinical Nurse Specialist      Hospital Day: 7    Recommendations:  1. Psychosocial support of patient and family:spoke with daughter Okey Regal as well as son Ferne Reus (in [place]) by phone at [phone]  2. Assist with goals of care: FAMILY WISHES TO CONTINUE FULL AGGRESSIVE CARE: FULL CODE ETC; FEEDING TUBE IF NECESSARY AND ICD IF NECESSARY- daughter and son are in agreement  3. Anticipatory Guidance:arrhythmia/post cardiac arrest; dementia, malnourishment  5. Code Status: full (discussed over phone; wants to discuss with other family members); hope to meet today to discuss further  6. Symptom Management - no acute issues at this time    CC: AMS    HPI:  NGOZI ALVIDREZ is a 82 y.o. female with significant past medical history of A-fib (on pradaxa; asthma, COPD,, GERD, depression and dementia, HLD who presented by EMS from home with altered mental status. ??Patient was found by family altered and barely responsive. ??She was noted to be in VT on monitoring; was defibrillated which persisted in the hospital and was defibrillated again. ??CPR was performed. ??CXR: notes cardiomegaly with pulmonary edema and a small right pleural effusion. CT of head shows no acute pathology but with chronic microvascular ischemic changes.  Cardiology and EP consulted. Pt was very confused but denied chest pain, shortness of breath, or other acute distress. She was admitted to [facility] with NSTEMI.   Pt was started on amiodarone; fluids, heparin and ASA but it's reported pt not taking orally. Pt underwent EGD on 12/12- noting 3 duodenal ulcers-recommendation for IV H2 blockers. Pt lives with daughter, Okey Regal and has had HHC in the past. Pt is full code.   Palliative Medicine has been consulted to assist pt/family with establishing goals of care; code status discussion; family support and symptom management.    ??    Ms Brandenburg is laying in bed somewhat less agitated today. Speech somewhat clearer today; denies  pain; states ok to update daughter and says thank you at the end of exam. No family at present. Again - Message left for daughter; Okey Regal on voicemail.  Again discussed goals of care regarding feeding issue;s ICD placement and code status. .  Pt still refusing to take po meds and is refusing water. Has tele sitter at bedside; pt continues to be agitated and pulling at lines and monitor wires.  Spoke with staff. Will follow along.    Spoke with son and daughter by phone- they are both in agreement to continue full support (full code/ ICD if needed; feeding tube if needed).  Both feel dementia is new and pt not responsive d/t HOH and eye issues.  We talked about her several arrests; other medical problems but feel they want to honor pt's wishes to continue full support.  Plan to take pt home with [facility]. Support given.  SSW updated.       Physical Exam:  Vitals:    BP (!) 154/79    Pulse 103    Temp 97.6 ??F (36.4 ??C) (Oral)    Resp 20    Ht 5\' 5"  (1.651 m)    Wt 173 lb (78.5 kg)    SpO2 96%    BMI 28.79 kg/m??   Gen:  Awake; less slurred speech today; HOH; following commands, in no acute distress  HEENT: Pupils equal and round; reactive to light  Neck: Supple  Lungs: CTA bilaterally; no audible rhonchi or wheezes noted  Heart: A-fib; RRR, no murmur,  rub, or gallop noted during exam  Abd: Soft, non tender, non distended, BS+  Ext: Moving all extremities, no edema, pulses present  Skin:  Warm and dry  Neuro: Alert, oriented to self only;  following commands; confused  ??       ALLERGIES:Aspirin; Chicken allergy; Eggs or egg-derived products; and Sulfa antibiotics    ??? heparin (porcine)  5,000 Units Subcutaneous BID   ??? sodium chloride (PF)  10 mL Intravenous Daily   ??? famotidine (PEPCID) injection  20 mg Intravenous Q12H   ??? sodium chloride flush  10 mL Intravenous 2 times per day       mineral oil-hydrophilic petrolatum, sodium chloride flush, trimethobenzamide, ipratropium-albuterol, acetaminophen    ??? dextrose 5% and 0.45%  NaCl with KCl 20 mEq 100 mL/hr at 04/09/16 0837         Labs     CBC:   Lab Results   Component Value Date    WBC 8.7 04/09/2016    RBC 4.27 04/09/2016    HGB 12.6 04/09/2016    HCT 39.3 04/09/2016    PLT 125 04/09/2016    MCV 92.0 04/09/2016     BMP:    Lab Results   Component Value Date    NA 137 04/09/2016    K 4.3 04/09/2016    CL 102 04/09/2016    CO2 21 04/09/2016    BUN 8 04/09/2016    CREATININE 0.6 04/09/2016    GLUCOSE 78 04/09/2016    CALCIUM 7.7 04/09/2016     PT/INR:    Lab Results   Component Value Date    PROTIME 22.6 04/03/2016    INR 2.0 04/03/2016         Impression:  Principal Problem:    V-tach [facility](HCC)  Active Problems:    Moderate protein-calorie malnutrition (HCC)    COPD (chronic obstructive pulmonary disease) (HCC)    NSTEMI (non-ST elevated myocardial infarction) (HCC)    CAD (coronary artery disease), native coronary artery    Hyperlipidemia LDL goal <100    PUD (peptic ulcer disease)    Dementia    Acquired hypothyroidism    Hypokalemia    Persistent atrial fibrillation (HCC)    Esophageal ring    Encounter for palliative care        Assessment/Plan :   Gemma PayorRuth E Whittleis a 82 y.o.female with VT/cardiac arrest requiring defibrillation which persisted in the hospital and  was defibrillated again.  ??CPR was performed. Admitted for NSTEMI. Cardiology following-may consider MPI if family chooses further aggressive care but pt has had functional decline; dementia and now refusing to take po. Has Elevated liver enzymes - EGD done noting gastric ulcers.  Pt remains full code; family to discuss further.  Family to set up meeting time to establish GOC and further discuss code status. Spoke with staff.  ??Asked daughter, [name] to call me tomorrow to set up family meeting time. Full code    12/14 Pt transferred out of the ICU. Still very confused/HOH/slurred speech.  Refusing meds and water. Spoke with daughter yesterday about need to address code status.  Wants full code until family discusses further.   Requested daughter call me today to discuss goals of care; states she is working but may be here later afternoon.  Pt more agitated today; pulls at lines. Requested tele sitter. Will follow along. Spoke with staff.     1545- message left on voice mail for daughter [name] since PM did not receive call back  on plan to meet; again requested to set up meeting to plan GOC and discuss code status.     12/15 Family wishes to continue full support; ICD if necessary; artificial feeding if necessary; see above. SSW updated. Family wishes to take pt home with HHC.     Louellen Molderiane Sherryann Frese  MSN, APRN-CNS, [facility] 04/09/2016 10:02 AM    Time in minutes spent: 25    More than 50% of this interaction was related to counseling or information given r/t disease process; plan of care; and code status.      Thank you for allowing us to work with you in the care of this patient.      Patient assessment and plan discussed with Palliative Medicine Physician.

## 2016-04-09 NOTE — Progress Notes (Signed)
GI: Daughter [name] at the bedside and has questions re: plans and even seems to question if/when defibrillator to be placed. Reviewing notes indicates this is not planned in the immediate future. Confusion likely sundowning and [name] says she was conversant last PM and recognized her etc. She is very HOH.  Would like to see her up out of bed.    Even after major surgery, went home and ate well. Now on clears w/o difficult but amount taken is small.    Previous imaging revealed cirrhosis. No obvious cause, certainly not alcohol and explains the abnormal LFTs more than the arrest that occcurred. Not much of an issue. Has no varices.    Family needs informed of plans or reiterated if already done. Will ask speech to revaluate but anticipate safe swallow then trial more palatable diet.

## 2016-04-09 NOTE — Plan of Care (Signed)
Problem: Falls - Risk of  Goal: Absence of falls  Outcome: Met This Shift  Plan of care discussed with patient/family.  Patient/family incorporated into plan of care.     Problem: Risk for Impaired Skin Integrity  Goal: Tissue integrity - skin and mucous membranes  Structural intactness and normal physiological function of skin and  mucous membranes.   Outcome: Met This Shift  Plan of care discussed with patient/family.  Patient/family incorporated into plan of care.

## 2016-04-09 NOTE — Other (Signed)
Inpatient Wound Care(Initial [phone])[phone]    Admit Date: 04/03/2016 12:22 PM    Reason for consult:  Perineal excoriation    Significant history:  Admitted from home for AMS.  ??A-Fib   Anticoagulated   Asthma   COPD (chronic obstructive pulmonary disease) (HCC)   Depression   Forgetfulness   GERD (gastroesophageal reflux disease)   HOH (hard of hearing)   Hyperlipidemia   Pain   Restless leg syndrome   Thyroid disease          Findings:       04/09/16 1124   Skin Integrity Site 2   Skin Integrity Location 2 Other (Comment)  (intact blanchable erythema)   Location 2 coccyx   Skin Integrity Site 3   Skin Integrity Location 3 Redness   Location 3 perineum   Skin Integrity Site 4   Skin Integrity Location 4 Bruising   Location 4 generalized   Skin Integrity Site 5   Skin Integrity Location 5 Other (Comment)  (reddish/purple discoloration resolving yeast)   Location 5 (peri rectal)       Interventions in place:  Aquaphor and FMS.  TAP sytem pillows for floating the heels.    Plan:Recommend antifungal ointment.  Recommend preventive care. [name] 04/09/2016 11:30 AM

## 2016-04-10 LAB — EKG 12-LEAD
Atrial Rate: 100 {beats}/min
P Axis: 95 degrees
P-R Interval: 192 ms
Q-T Interval: 360 ms
QRS Duration: 78 ms
QTc Calculation (Bazett): 464 ms
R Axis: -12 degrees
T Axis: 19 degrees
Ventricular Rate: 100 {beats}/min

## 2016-04-10 MED FILL — ALOE VESTA ANTIFUNGAL 2 % EX OINT: 2 % | CUTANEOUS | Qty: 56

## 2016-04-10 MED FILL — ACEPHEN 650 MG RE SUPP: 650 MG | RECTAL | Qty: 1

## 2016-04-10 MED FILL — KCL IN DEXTROSE-NACL 20-5-0.45 MEQ/L-%-% IV SOLN: INTRAVENOUS | Qty: 1000

## 2016-04-10 MED FILL — HEPARIN SODIUM (PORCINE) 10000 UNIT/ML IJ SOLN: 10000 UNIT/ML | INTRAMUSCULAR | Qty: 1

## 2016-04-10 MED FILL — NORMAL SALINE FLUSH 0.9 % IV SOLN: 0.9 % | INTRAVENOUS | Qty: 10

## 2016-04-10 MED FILL — NORMAL SALINE FLUSH 0.9 % IV SOLN: 0.9 % | INTRAVENOUS | Qty: 20

## 2016-04-10 MED FILL — FAMOTIDINE 20 MG/2ML IV SOLN: 20 MG/2ML | INTRAVENOUS | Qty: 2

## 2016-04-10 NOTE — Progress Notes (Signed)
ELECTROPHYSIOLOGY PROGRESS NOTE    [name]  01-19-1928  Date of Service: 04/10/2016  PCP: Otilio SaberJoseph J Gallo, DO  Primary Electrophysiologist: Barnie AldermanAllan Katz, DO  Chief Complaint: PMVT, AF, hypokalemia        Subjective: [name] is seen in hospital follow-up. She is confused this am but denies complaints. Pt now refusing to eat or take pills. Pt is confused and unable to answer any questions appropriately.      Patient Active Problem List   Diagnosis   ??? Moderate protein-calorie malnutrition (HCC)   ??? V-tach [facility](HCC)   ??? COPD (chronic obstructive pulmonary disease) (HCC)   ??? NSTEMI (non-ST elevated myocardial infarction) (HCC)   ??? CAD (coronary artery disease), native coronary artery   ??? Hyperlipidemia LDL goal <100   ??? PUD (peptic ulcer disease)   ??? Dementia   ??? Acquired hypothyroidism   ??? Hypokalemia   ??? Persistent atrial fibrillation (HCC)   ??? Esophageal ring   ??? Encounter for palliative care         Scheduled Medications:  ??? miconazole nitrate   Topical BID   ??? heparin (porcine)  5,000 Units Subcutaneous BID   ??? sodium chloride (PF)  10 mL Intravenous Daily   ??? famotidine (PEPCID) injection  20 mg Intravenous Q12H   ??? sodium chloride flush  10 mL Intravenous 2 times per day       Infusion Medications:  ??? dextrose 5% and 0.45% NaCl with KCl 20 mEq 100 mL/hr at 04/10/16 0537       PRN Medications:  miconazole nitrate **AND** miconazole nitrate, mineral oil-hydrophilic petrolatum, sodium chloride flush, trimethobenzamide, ipratropium-albuterol, acetaminophen    Allergies   Allergen Reactions   ??? Aspirin      Reaction unknown   ??? Chicken Allergy      Allergic to poultry (eggs, chicken, [place])   ??? Eggs Or Egg-Derived Products Nausea And Vomiting   ??? Sulfa Antibiotics Nausea And Vomiting       Wt Readings from Last 3 Encounters:   04/08/16 173 lb (78.5 kg)   02/26/16 156 lb 9.6 oz (71 kg)   02/28/15 181 lb (82.1 kg)     Temp Readings from Last 3 Encounters:   04/10/16 98 ??F (36.7 ??C) (Oral)   02/26/16 98.2 ??F  (36.8 ??C) (Oral)   02/28/15 98.6 ??F (37 ??C) (Temporal)     BP Readings from Last 3 Encounters:   04/10/16 (!) 152/72   02/26/16 (!) 105/51   02/28/15 (!) 101/56     Pulse Readings from Last 3 Encounters:   04/10/16 101   02/26/16 67   02/28/15 80         Intake/Output Summary (Last 24 hours) at 04/10/16 1158  Last data filed at 04/10/16 0541   Gross per 24 hour   Intake          2446.67 ml   Output             1100 ml   Net          1346.67 ml          Physical exam:  Constitutional: BP (!) 152/72    Pulse 101    Temp 98 ??F (36.7 ??C) (Oral)    Resp 20    Ht 5\' 5"  (1.651 m)    Wt 173 lb (78.5 kg)    SpO2 97%    BMI 28.79 kg/m??    Gen: frail elderly female, in no  acute distress   Eyes: conjunctivae normal, no xanthelasma   Ears, Nose, Throat: oral mucosa moist, no cyanosis   Neck: supple, no JVD, no bruits, no thyromegaly   CV: normal rate, irregularly irregular rhythm, normal S1 & S2, No S3 or S4. No murmurs, rubs, or gallops. Peripheral pulses normal   Lungs: clear to auscultation bilaterally, normal respiratory effort   Abdomen: soft, non-tender, bowel sounds present, no masses or hepatomegaly   Extremities: no digital clubbing, no edema   Skin: warm, no rashes   Neuro/Psych: awake and alert, confused      Rhythm strip review: AF, rates 60-70s    Recent Labs      04/08/16   0606  04/09/16   0508   WBC  8.5  8.7   HGB  12.2  12.6   HCT  38.0  39.3   MCV  92.2  92.0   PLT  119*  125*     Recent Labs      04/08/16   0606  04/08/16   0903  04/09/16   0508   NA  135  135  137   K  7.0*  4.5  4.3   CL  101  102  102   CO2  21*  24  21*   BUN  8  8  8    CREATININE  0.5  0.6  0.6     No results for input(s): PROTIME, INR in the last 72 hours.  No results for input(s): CKTOTAL, CKMB, CKMBINDEX, TROPONINI in the last 72 hours.  No results for input(s): BNP in the last 72 hours.  No results for input(s): CHOL, HDL, TRIG in the last 72 hours.    Invalid input(s): CHOLHDLR, LDLCALCU  PT/INR:    Lab Results   Component Value  Date    PROTIME 22.6 04/03/2016    INR 2.0 04/03/2016     TSH:    Lab Results   Component Value Date    TSH 2.900 04/03/2016       Impression:    1. PMVT arrest  - Felt to be secondary to prolonged QT interval and hypokalemia. Appreciate cardiology input re: possible ischemic evaluation.   - no recurrent PMVT, now off isoproterenol  - K+ being replaced  - Holding all QT prolonging medications: Aricept, celexa, prilosec, zofran  - Appreciate cardiology input. Recommend palliative care evaluation. ICD is not appropriate at this time given her current state, but may be reconsidered in the future if her prognosis changes.   - Reviewed consultant and primary physician's notes, and due to the apparent uncertainty as to whether an ICD is being planned in this patient, I spoke to the patient's son [name] and called her son Christen BameRonnie ??2 but no answer. ICD is not recommended in patients with reversible cause, the most common of which would be ischemic heart disease, which we have not worked her up for to this point. I do not disagree with cardiology's recommendation against invasive approach given her comorbidities, and will defer to cardiology as to whether myocardial perfusion imaging is of any utility if there are no plans for catheterization anyway.. An ICD is clearly NOT an appropriate intervention in a patient with advanced dementia who is refusing to eat and whose life expectancy does not appear to be greater than 1 year. In fact, this seems rather inhumane in my opinion. An ICD is unlikely to result in meaningful prolongation of her life, but has a high likelihood  of causing pain and suffering, as well as risk of complications associated with the ICD implant itself and with her inability to comply with postoperative activity restrictions and follow-up. There is apparently a family discussion later today, although the daughter is not sure which physician they're meeting with, but I would encourage a second opinion from  another electrophysiologist, or even an ethics consult if the above is not consistent with overall goals of care.     2. Prolonged QTc  - 2/2 Aricept, celexa, prilosec in combination with hypokalemia  - QT improved  ??  3. Longstanding persistent/permanent AF  - current in AF/Fl   - was on Pradaxa as an OP.  - would use Eliquis 5 mg BID at present--not taking PO  - Previously on IV heparin, now off  ??  4. Hypokalemia  - continue replacement  ??  5. Dementia  - on Aricept as an OP  ??  6. HLD  - Zocor  ??  7. Hypothyroidism  - on synthroid replacement  ??  8. Depression  - on celexa as an OP  ??  9. COPD  ??  Recommendations:  ??  1. Continue to hold aricept, celexa, and prilosec. Also avoid all QT-prolonging medications.  2. Once she is able to take po, would recommend Eliquis 5 mg BID.  3. No plans for ICD implantation from my standpoint.   4. Appreciate palliative care input.   5. Please call with questions.     Thank you for allowing me the opportunity to participate in the care of your patient.     Marylene Buerger, MD, [facility]  Cardiac Electrophysiology  [facility]  04/10/2016

## 2016-04-10 NOTE — Plan of Care (Signed)
Problem: Falls - Risk of  Goal: Absence of falls  Outcome: Met This Shift

## 2016-04-10 NOTE — Progress Notes (Addendum)
Progress Note    Admit Date: 04/03/2016      Subjective:     Refusing most oral intake except popsicles. Family says spit up apple sauce. Spoke with son by phone and daughter at bedside. Their concerns are strict bedrest and not PT since ICU. PT had recommended SAR but probably did not think she would be here this long. Last seen 6 days ago.    I am told that liver disease was mentioned to the family post op but is not in the operative report.     Review of Systems  NOT ABLE  Constitutional:   Respiratory:           Gastrointestinal:  Genitourinary:  Hematologic/lymphatic:  Neurological:    Objective:     Patient Vitals for the past 24 hrs:   BP Temp Temp src Pulse Resp SpO2   04/10/16 1120 (!) 152/72 98 ??F (36.7 ??C) Oral 101 20 -   04/10/16 0817 (!) 144/82 97.6 ??F (36.4 ??C) Oral 101 18 97 %   04/10/16 0445 138/66 97.6 ??F (36.4 ??C) Axillary 101 18 97 %   04/10/16 0015 128/80 97.6 ??F (36.4 ??C) Axillary 101 18 98 %   04/09/16 2035 (!) 144/86 97.4 ??F (36.3 ??C) Axillary 102 20 -   04/09/16 1624 (!) 152/74 96.8 ??F (36 ??C) Axillary 101 20 -   04/09/16 1600 - - - - - 96 %       Physical Exam:  General appearance: NAD  HEENT: normal except extremely HOH  Lungs: clear  Heart: regular  Abdomen: benign  Extremities: moves all  Neurologic: hard to assess mental status and family admits to some degree of confusion     Data Review CBC with Differential:    Recent Labs      04/08/16   0606  04/09/16   0508   WBC  8.5  8.7   HGB  12.2  12.6   HCT  38.0  39.3   MCV  92.2  92.0   PLT  119*  125*     Recent Labs      04/08/16   0606  04/08/16   0903  04/09/16   0508   NA  135  135  137   K  7.0*  4.5  4.3   CL  101  102  102   CO2  21*  24  21*   BUN  8  8  8    CREATININE  0.5  0.6  0.6     No results for input(s): AST, ALT, ALB, BILIDIR, BILITOT, ALKPHOS in the last 72 hours.    Assessment:   1. Dysphagia. Not clear if this is a physical issue or emotional one. Was eating fine at home w/o dysphagia and there is no evidence of ischemic  neurologic event.  Broached idea of feeding tube if this issue cannot be resolved and did not get the impression either of her children would been in favor and granddaughter certain she would pull it out.   Not clear  if speech pathology alerted about their need to return or if they work on weekends.    2.Cirrhosis by CT w/o etiology but currently moot            Plan:     Await speech pathology. If family says no to feeding tube, offer food and see what happens.      Electronically signed by Janit Paganichard J Karrington Mccravy, MD on 04/10/2016 at 3:50  PM

## 2016-04-10 NOTE — Plan of Care (Signed)
Problem: Imbalanced Nutrition, Less than Body Requirements  Goal: STG - patient will complete eating  Outcome: Not Met This Shift

## 2016-04-10 NOTE — Progress Notes (Signed)
Palliative Medicine   Progress Note  Clinical Nurse Specialist      04/10/16  Admit Date: 04/03/2016    Recommendations:  1. Psychosocial support of patient and family. Able to tell me that she is in hospital, confused to time , no family at bedside   Per PM note yesterday -  12/15 spoke with daughter [name] as well as [name] by phone at [phone]   FAMILY WISHES TO CONTINUE FULL AGGRESSIVE CARE: FULL CODE ETC; FEEDING TUBE IF NECESSARY AND ICD IF NECESSARY- daughter and son are in agreement  2. Assist with goals of care.arrhythmia/post cardiac arrest; dementia, malnourishment      CC:AMS    HPI :  Michelle Collieris a 82 Collierwith significant past medical history of A-fib (on pradaxa; asthma, COPD,, GERD, depression and dementia, HLD??who presented by EMS from home with??altered mental status. ??Patient was found by family altered and barely responsive. ??She was noted to be in??VT on monitoring; was defibrillated which persisted in the??hospital and??was defibrillated again. ??CPR was performed. ??CXR: notes cardiomegaly with pulmonary edema and a small right pleural effusion. CT of head shows no acute??pathology but with chronic microvascular ischemic changes. ??Cardiology and EP consulted. Pt was??very confused but denied??chest pain, shortness of breath, or other acute distress. She was [facility] to[facility] with [facility]. ????Pt was started on amiodarone; fluids, heparin and ASA but it's reported pt not taking orally. Pt underwent EGD on 12/12- noting 3 duodenal ulcers-recommendation for IV H2 blockers. Pt lives with daughter, [name] and has had HHC in the past. Pt is full code.   Palliative Medicine continues to assist pt/family with establishing goals of care; code status discussion; family support and symptom management. ??  ??  ??12/15  [name] is laying in bed somewhat less agitated today. Speech somewhat clearer today; denies pain; states ok to update daughter and says thank you at the end of exam. No  family at present. Again - Message left for daughter; [name] on voicemail.  Again discussed goals of care regarding feeding issue;s ICD placement and code status. .  Pt still refusing to take po meds and is refusing water. Has tele sitter at bedside; pt continues to be agitated and pulling at lines and monitor wires. ??Spoke with staff. Will follow along.  ??Spoke with son and daughter by phone- they are both in agreement to continue full support (full code/ ICD if needed; feeding tube if needed).  Both feel dementia is new and pt not responsive d/t HOH and eye issues.  We talked about her several arrests; other medical problems but feel they want to honor pt's wishes to continue full support.  [facility] Support given.  SSW updated.   ??  12/16      [name] seen in room, appears less confused per bedside nurse, she is eating from her tray , although small amount liquids, no family at bedside        Physical Exam:  Vitals:    BP (!) 144/82    Pulse 101    Temp 97.6 ??F (36.4 ??C) (Oral)    Resp 18    Ht 5\' 5"  (1.651 m)    Wt 173 lb (78.5 kg)    SpO2 97%    BMI 28.79 kg/m??    Gen:????Awake; less slurred speech today; HOH; following commands, in no acute distress  HEENT: Pupils equal and round; reactive to light  Neck:??Supple  Lungs:??CTA bilaterally; no audible rhonchi or  wheezes noted  Heart: A-fib;??RRR, no murmur, rub, or gallop noted during exam  Abd:??Soft, non tender, non distended, BS+  Ext:??Moving all extremities, no edema, pulses present  Skin:????Warm and dry  Neuro:??Alert, oriented to self only; ??following commands; confused    ALLERGIES: Aspirin; Chicken allergy; Eggs or egg-derived products; and Sulfa antibiotics    ??? miconazole nitrate   Topical BID   ??? heparin (porcine)  5,000 Units Subcutaneous BID   ??? sodium chloride (PF)  10 mL Intravenous Daily   ??? famotidine (PEPCID) injection  20 mg Intravenous Q12H   ??? sodium chloride flush  10 mL Intravenous 2 times per day       miconazole nitrate  **AND** miconazole nitrate, mineral oil-hydrophilic petrolatum, sodium chloride flush, trimethobenzamide, ipratropium-albuterol, acetaminophen    ??? dextrose 5% and 0.45% NaCl with KCl 20 mEq 100 mL/hr at 04/10/16 0537         Labs     CBC:   Lab Results   Component Value Date    WBC 8.7 04/09/2016    RBC 4.27 04/09/2016    HGB 12.6 04/09/2016    HCT 39.3 04/09/2016    PLT 125 04/09/2016    MCV 92.0 04/09/2016     BMP:    Lab Results   Component Value Date    NA 137 04/09/2016    K 4.3 04/09/2016    CL 102 04/09/2016    CO2 21 04/09/2016    BUN 8 04/09/2016    CREATININE 0.6 04/09/2016    GLUCOSE 78 04/09/2016    CALCIUM 7.7 04/09/2016     PT/INR:    Lab Results   Component Value Date    PROTIME 22.6 04/03/2016    INR 2.0 04/03/2016         Principal Problem:    V-tach [facility](HCC)  Active Problems:    Moderate protein-calorie malnutrition (HCC)    COPD (chronic obstructive pulmonary disease) (HCC)    NSTEMI (non-ST elevated myocardial infarction) (HCC)    CAD (coronary artery disease), native coronary artery    Hyperlipidemia LDL goal <100    PUD (peptic ulcer disease)    Dementia    Acquired hypothyroidism    Hypokalemia    Persistent atrial fibrillation (HCC)    Esophageal ring    Encounter for palliative care      Assessment/plan :   Michelle PayorRuth E Whittleis a 82 y.o.female with VT/cardiac arrest requiring defibrillation which persisted in the??hospital and ??was defibrillated again. ????CPR was performed. Admitted for NSTEMI. Cardiology following-may consider MPI if family chooses further aggressive care but pt has had functional decline; dementia and now refusing to take po. Has Elevated liver enzymes - EGD done noting gastric ulcers. ??Pt remains full code; family to discuss further. ??Family to set up meeting time to establish GOC and further discuss code status. Spoke with staff.  ??Asked daughter, [name] to call me tomorrow to set up family meeting time. Full code  ??  12/14 Pt transferred out of the ICU. Still very  confused/HOH/slurred speech.  Refusing meds and water. Spoke with daughter yesterday about need to address code status.  Wants full code until family discusses further.  Requested daughter call me today to discuss goals of care; states she is working but may be here later afternoon.  Pt more agitated today; pulls at lines. Requested tele sitter. Will follow along. Spoke with staff.   ??  1545- message left on voice mail for daughter [name] since PM did not receive  call back on plan to meet; again requested to set up meeting to plan GOC and discuss code status.   ??  12/15 Family wishes to continue full support; ICD if necessary; artificial feeding if necessary; see above. SSW updated. Family wishes to take pt home with HHC.   ??    12/16  No family at bedside pleasantly confused, some oral intake today , per discussion with family yesterday they wish to continue current care     Vernie Shanks APRN-CNS, [facility] 04/10/2016 9:09 AM    Time Spent:15 min  More than 50% of this interaction was related to counseling or information given regarding goals of care,code status.    Thank you for allowing [place] to work with you in the care of this patient.   Assessment and plan discussed with Dr. Su Hilt

## 2016-04-10 NOTE — Plan of Care (Signed)
Problem: Risk for Impaired Skin Integrity  Goal: Tissue integrity - skin and mucous membranes  Structural intactness and normal physiological function of skin and  mucous membranes.   Outcome: Not Met This Shift

## 2016-04-10 NOTE — Progress Notes (Signed)
Subjective:    The patient is awake and alert.  Very confused.  Status post EGD (04/06/2016).  Denies chest pain, angina, and dyspnea.  Denies abdominal pain.  Patient not eating.  No nausea or vomiting.    Objective:    BP 138/66    Pulse 101    Temp 97.6 ??F (36.4 ??C) (Axillary)    Resp 18    Ht 5\' 5"  (1.651 m)    Wt 173 lb (78.5 kg)    SpO2 97%    BMI 28.79 kg/m??     Heart:  RRR, no murmurs, gallops, or rubs.  Lungs:  CTA bilaterally, no wheeze, rales or rhonchi  Abd: bowel sounds present, nontender, nondistended, no masses  Extrem:  No clubbing, cyanosis, or edema    CBC with Differential:    Lab Results   Component Value Date    WBC 8.7 04/09/2016    RBC 4.27 04/09/2016    HGB 12.6 04/09/2016    HCT 39.3 04/09/2016    PLT 125 04/09/2016    MCV 92.0 04/09/2016    MCH 29.5 04/09/2016    MCHC 32.1 04/09/2016    RDW 17.1 04/09/2016    LYMPHOPCT 25.8 04/09/2016    MONOPCT 13.5 04/09/2016    BASOPCT 0.3 04/09/2016    MONOSABS 1.18 04/09/2016    LYMPHSABS 2.25 04/09/2016    EOSABS 0.24 04/09/2016    BASOSABS 0.03 04/09/2016     CMP:    Lab Results   Component Value Date    NA 137 04/09/2016    K 4.3 04/09/2016    CL 102 04/09/2016    CO2 21 04/09/2016    BUN 8 04/09/2016    CREATININE 0.6 04/09/2016    GFRAA >60 04/09/2016    LABGLOM >60 04/09/2016    GLUCOSE 78 04/09/2016    PROT 4.8 04/06/2016    LABALBU 2.1 04/06/2016    CALCIUM 7.7 04/09/2016    BILITOT 2.8 04/06/2016    ALKPHOS 227 04/06/2016    AST 102 04/06/2016    ALT 44 04/06/2016     BMP:    Lab Results   Component Value Date    NA 137 04/09/2016    K 4.3 04/09/2016    CL 102 04/09/2016    CO2 21 04/09/2016    BUN 8 04/09/2016    LABALBU 2.1 04/06/2016    CREATININE 0.6 04/09/2016    CALCIUM 7.7 04/09/2016    GFRAA >60 04/09/2016    LABGLOM >60 04/09/2016    GLUCOSE 78 04/09/2016     Magnesium:    Lab Results   Component Value Date    MG 1.9 04/09/2016     Phosphorus:    Lab Results   Component Value Date    PHOS 2.6 02/23/2016     PT/INR:    Lab Results    Component Value Date    PROTIME 22.6 04/03/2016    INR 2.0 04/03/2016     PTT:    Lab Results   Component Value Date    APTT 55.8 04/06/2016   [APTT}     Assessment:    Patient Active Problem List   Diagnosis   ??? Moderate protein-calorie malnutrition (HCC)   ??? V-[facility]ach [facility](HCC)   ??? COPD (chronic obstructive pulmonary disease) (HCC)   ??? NSTEMI (non-ST elevated myocardial infarction) (HCC)   ??? CAD (coronary artery disease), native coronary artery   ??? Hyperlipidemia LDL goal <100   ??? PUD (peptic ulcer disease)   ???  Dementia   ??? Acquired hypothyroidism   ??? Hypokalemia   ??? Persistent atrial fibrillation (HCC)   ??? Esophageal ring   ??? Encounter for palliative care       Plan:  Supplement diet.  EP on board.  Continue breathing treatments and supplemental oxygne.  Cardiology on board.  Early conservative therapy chosen.  Continue medical management of ascad.  Continue aggressive lipid therapy.  PPI therapy.  Stable.  Continue synthroid.  Supplemented.  Rate controlled.  GI evaluation appreciated.  Status post EGD.    Pt/Ot evaluations for discharge planning.  Patient is declining.  Palliative care to speak with family.  Poor outcome expected.     Minda Dittoobert J Mazi Schuff  MD  7:53 AM  04/10/2016

## 2016-04-10 NOTE — Progress Notes (Signed)
Discussed case at length the past 10 days with Drs. Katherina MiresKatz, Hunt, Moser, Gemma.           1. Patient presented with recurrent ventricular tachycardia, with infarct pattern on ECG, and on recent ECG.  2. We attributed the VT to bradycardia induced likely by malnourishment related hypokalemia. We did consider ischemia induced bradycardia.  3. Therefore, there is no doubt she needs ischemia risk stratification/evaluation.  4. However, she is at the point where she cannot take anything by mouth. She is awaiting EGD. If we took her to the cath lab and she needed intervention she would not be able to ingest her dual antiplatelets. Therefore we are deferring LHC at this time. She is clinically stable, chest pain free, and TTE done yesterday with perfect biventricular function and specifically no wall motion abnormalities.   5. In the interim, has developed sepsis requiring transfer to MICU.      No plans for any ischemia evaluation at this point.       Liam RogersHayah M. Kassis-George, M.D.  Advanced Heart Failure and Pulmonary Hypertension  [place] [phone]

## 2016-04-10 NOTE — Progress Notes (Signed)
Called dr. Maple HudsonMoser to update on patient having visual hallucinations and is very anxious. Awaiting call back.

## 2016-04-11 ENCOUNTER — Inpatient Hospital Stay: Admit: 2016-04-11 | Primary: Family Medicine

## 2016-04-11 LAB — URINALYSIS
Glucose, Ur: NEGATIVE mg/dL
Ketones, Urine: NEGATIVE mg/dL
Nitrite, Urine: POSITIVE — AB
Specific Gravity, UA: >=1.03 (ref 1.005–1.030)
Urobilinogen, Urine: 0.2 E.U./dL (ref <2.0)
pH, UA: 5 (ref 5.0–9.0)

## 2016-04-11 LAB — COMPREHENSIVE METABOLIC PANEL
ALT: 36 U/L — ABNORMAL HIGH (ref 0–32)
AST: 66 U/L — ABNORMAL HIGH (ref 0–31)
Albumin: 2 g/dL — ABNORMAL LOW (ref 3.5–5.2)
Alkaline Phosphatase: 252 U/L — ABNORMAL HIGH (ref 35–104)
Anion Gap: 11 mmol/L (ref 7–16)
BUN: 4 mg/dL — ABNORMAL LOW (ref 8–23)
CO2: 21 mmol/L — ABNORMAL LOW (ref 22–29)
Calcium: 8.1 mg/dL — ABNORMAL LOW (ref 8.6–10.2)
Chloride: 100 mmol/L (ref 98–107)
Creatinine: 0.6 mg/dL (ref 0.5–1.0)
GFR African American: >60
GFR Non-African American: >60 mL/min/{1.73_m2} (ref >=60)
Glucose: 105 mg/dL (ref 74–109)
Potassium: 4.5 mmol/L (ref 3.5–5.0)
Sodium: 132 mmol/L (ref 132–146)
Total Bilirubin: 2.8 mg/dL — ABNORMAL HIGH (ref 0.0–1.2)
Total Protein: 5 g/dL — ABNORMAL LOW (ref 6.4–8.3)

## 2016-04-11 LAB — EKG 12-LEAD
Atrial Rate: 108 {beats}/min
Q-T Interval: 336 ms
QRS Duration: 64 ms
QTc Calculation (Bazett): 474 ms
R Axis: -14 degrees
T Axis: -8 degrees
Ventricular Rate: 120 {beats}/min

## 2016-04-11 LAB — BASIC METABOLIC PANEL
Anion Gap: 12 mmol/L (ref 7–16)
BUN: 5 mg/dL — ABNORMAL LOW (ref 8–23)
CO2: 19 mmol/L — ABNORMAL LOW (ref 22–29)
Calcium: 7.4 mg/dL — ABNORMAL LOW (ref 8.6–10.2)
Chloride: 101 mmol/L (ref 98–107)
Creatinine: 0.6 mg/dL (ref 0.5–1.0)
GFR African American: >60
GFR Non-African American: >60 mL/min/{1.73_m2} (ref >=60)
Glucose: 110 mg/dL — ABNORMAL HIGH (ref 74–109)
Potassium: 4 mmol/L (ref 3.5–5.0)
Sodium: 132 mmol/L (ref 132–146)

## 2016-04-11 LAB — TYPE AND SCREEN
ABO/Rh: A NEG
Antibody Screen: NEGATIVE

## 2016-04-11 LAB — CBC WITH AUTO DIFFERENTIAL
Basophils %: 0.2 % (ref 0.0–2.0)
Basophils Absolute: 0.02 E9/L (ref 0.00–0.20)
Eosinophils %: 0 % (ref 0.0–6.0)
Eosinophils Absolute: 0 E9/L — ABNORMAL LOW (ref 0.05–0.50)
Hematocrit: 37 % (ref 34.0–48.0)
Hemoglobin: 12.4 g/dL (ref 11.5–15.5)
Immature Granulocytes #: 0.05 E9/L
Immature Granulocytes %: 0.6 % (ref 0.0–5.0)
Lymphocytes %: 11.4 % — ABNORMAL LOW (ref 20.0–42.0)
Lymphocytes Absolute: 0.92 E9/L — ABNORMAL LOW (ref 1.50–4.00)
MCH: 30.2 pg (ref 26.0–35.0)
MCHC: 33.5 % (ref 32.0–34.5)
MCV: 90 fL (ref 80.0–99.9)
MPV: 10.3 fL (ref 7.0–12.0)
Monocytes %: 8.1 % (ref 2.0–12.0)
Monocytes Absolute: 0.66 E9/L (ref 0.10–0.95)
Neutrophils %: 79.7 % (ref 43.0–80.0)
Neutrophils Absolute: 6.45 E9/L (ref 1.80–7.30)
Platelets: 140 E9/L (ref 130–450)
RBC: 4.11 E12/L (ref 3.50–5.50)
RDW: 17.3 fL — ABNORMAL HIGH (ref 11.5–15.0)
WBC: 8.1 E9/L (ref 4.5–11.5)

## 2016-04-11 LAB — BLOOD GAS, ARTERIAL
B.E.: -2.6 mmol/L (ref <=3.0)
COHb: 1 % (ref 0.0–1.5)
Date Analyzed: 20171217
Date Of Collection: 20171217
HCO3: 18.3 mmol/L — ABNORMAL LOW (ref 22.0–26.0)
HHb: 4.2 % (ref 0.0–5.0)
Lab: 9558
MetHb: 0.3 % (ref 0.0–1.5)
O2 Content: 17.4 mL/dL
O2 Sat: 95.7 % (ref 92.0–98.5)
O2Hb: 94.5 % (ref 94.0–97.0)
Operator ID: 45900
PCO2: 22.4 mmHg — ABNORMAL LOW (ref 35.0–45.0)
PO2: 73.1 mmHg (ref 60.0–100.0)
Potassium: 4.16 mmol/L (ref 3.30–5.10)
Pt Temp: 37 C
Time Analyzed: 541
Time Collected: 539
pH, Blood Gas: 7.529 — ABNORMAL HIGH (ref 7.350–7.450)
tHb (est): 13.1 g/dL (ref 11.5–16.5)

## 2016-04-11 LAB — PHOSPHORUS: Phosphorus: 1.8 mg/dL — ABNORMAL LOW (ref 2.5–4.5)

## 2016-04-11 LAB — MAGNESIUM: Magnesium: 1.6 mg/dL (ref 1.6–2.6)

## 2016-04-11 LAB — LACTIC ACID
Lactic Acid: 3.4 mmol/L — ABNORMAL HIGH (ref 0.5–2.2)
Lactic Acid: 4.7 mmol/L (ref 0.5–2.2)
Lactic Acid: 5.1 mmol/L (ref 0.5–2.2)

## 2016-04-11 LAB — CK-MB: CK-MB: 2.6 ng/mL (ref 0.0–4.3)

## 2016-04-11 LAB — AMMONIA: Ammonia: 33 umol/L (ref 11.0–51.0)

## 2016-04-11 LAB — CK: Total CK: 41 U/L (ref 20–180)

## 2016-04-11 LAB — POCT GLUCOSE
Meter Glucose: 83 mg/dL (ref 70–110)
Meter Glucose: 83 mg/dL (ref 70–110)

## 2016-04-11 LAB — PROCALCITONIN: Procalcitonin: 0.26 ng/mL — ABNORMAL HIGH (ref 0.00–0.08)

## 2016-04-11 LAB — CLOSTRIDIUM DIFFICILE EIA: C difficile Toxin, EIA: NOT DETECTED

## 2016-04-11 LAB — MICROSCOPIC URINALYSIS

## 2016-04-11 MED ORDER — SODIUM CHLORIDE 0.9 % IV SOLN
0.9 % | Freq: Once | INTRAVENOUS | Status: AC
Start: 2016-04-11 — End: 2016-04-11
  Administered 2016-04-11: 13:00:00 1500 mg via INTRAVENOUS

## 2016-04-11 MED ORDER — POTASSIUM PHOSPHATE 3 MMOLE/ML IV SOLN (MIXTURES ONLY)
155 MMOLE/5ML | Freq: Once | INTRAVENOUS | Status: DC
Start: 2016-04-11 — End: 2016-04-11

## 2016-04-11 MED ORDER — NORMAL SALINE FLUSH 0.9 % IV SOLN
0.9 % | Freq: Two times a day (BID) | INTRAVENOUS | Status: DC
Start: 2016-04-11 — End: 2016-04-17
  Administered 2016-04-11 – 2016-04-17 (×13): 10 mL via INTRAVENOUS

## 2016-04-11 MED ORDER — DIPHENHYDRAMINE HCL 50 MG/ML IJ SOLN
50 MG/ML | Freq: Once | INTRAMUSCULAR | Status: DC
Start: 2016-04-11 — End: 2016-04-11

## 2016-04-11 MED ORDER — LORAZEPAM 2 MG/ML IJ SOLN
2 MG/ML | Freq: Once | INTRAMUSCULAR | Status: AC
Start: 2016-04-11 — End: 2016-04-11
  Administered 2016-04-11: 05:00:00 1 mg via INTRAVENOUS

## 2016-04-11 MED ORDER — ACETAMINOPHEN 325 MG PO TABS
325 MG | ORAL | Status: DC | PRN
Start: 2016-04-11 — End: 2016-04-17
  Administered 2016-04-14: 16:00:00 650 mg via ORAL

## 2016-04-11 MED ORDER — FAMOTIDINE 20 MG/2ML IV SOLN
20 MG/2ML | Freq: Two times a day (BID) | INTRAVENOUS | Status: DC
Start: 2016-04-11 — End: 2016-04-17
  Administered 2016-04-11 – 2016-04-17 (×13): 20 mg via INTRAVENOUS

## 2016-04-11 MED ORDER — FENTANYL CITRATE (PF) 100 MCG/2ML IJ SOLN
100 MCG/2ML | INTRAMUSCULAR | Status: AC
Start: 2016-04-11 — End: 2016-04-11
  Administered 2016-04-11: 12:00:00 25 via INTRAVENOUS

## 2016-04-11 MED ORDER — DEXTROSE 5 % IV SOLN (ADD-VANTAGE)
5 % | INTRAVENOUS | Status: DC
Start: 2016-04-11 — End: 2016-04-12
  Administered 2016-04-11: 23:00:00 7.5 mg/h via INTRAVENOUS
  Administered 2016-04-11: 12:00:00 5 mg/h via INTRAVENOUS

## 2016-04-11 MED ORDER — ENOXAPARIN SODIUM 80 MG/0.8ML SC SOLN
80 MG/0.8ML | Freq: Two times a day (BID) | SUBCUTANEOUS | Status: DC
Start: 2016-04-11 — End: 2016-04-17
  Administered 2016-04-12 – 2016-04-17 (×9): 80 mg/kg via SUBCUTANEOUS

## 2016-04-11 MED ORDER — VANCOMYCIN 1500 MG IN DEXTROSE 5% 300 ML IVPB
Freq: Once | Status: DC
Start: 2016-04-11 — End: 2016-04-11

## 2016-04-11 MED ORDER — DEXTROSE 5 % IV SOLN
5 % | INTRAVENOUS | Status: AC
Start: 2016-04-11 — End: 2016-04-11
  Administered 2016-04-11 (×2): 20 mmol via INTRAVENOUS

## 2016-04-11 MED ORDER — MAGNESIUM SULFATE 2000 MG/50 ML IVPB PREMIX
2 GM/50ML | Freq: Once | INTRAVENOUS | Status: AC
Start: 2016-04-11 — End: 2016-04-11
  Administered 2016-04-11: 13:00:00 2 g via INTRAVENOUS

## 2016-04-11 MED ORDER — THIAMINE HCL 100 MG/ML IJ SOLN
100 MG/ML | Freq: Every day | INTRAMUSCULAR | Status: DC
Start: 2016-04-11 — End: 2016-04-17
  Administered 2016-04-11 – 2016-04-17 (×7): 100 mg via INTRAVENOUS

## 2016-04-11 MED ORDER — FENTANYL CITRATE (PF) 100 MCG/2ML IJ SOLN
100 MCG/2ML | Freq: Once | INTRAMUSCULAR | Status: AC
Start: 2016-04-11 — End: 2016-04-11

## 2016-04-11 MED ORDER — MAGNESIUM HYDROXIDE 400 MG/5ML PO SUSP
400 MG/5ML | Freq: Every day | ORAL | Status: DC | PRN
Start: 2016-04-11 — End: 2016-04-17

## 2016-04-11 MED ORDER — NORMAL SALINE FLUSH 0.9 % IV SOLN
0.9 % | INTRAVENOUS | Status: DC | PRN
Start: 2016-04-11 — End: 2016-04-17
  Administered 2016-04-12 – 2016-04-15 (×8): 10 mL via INTRAVENOUS

## 2016-04-11 MED ORDER — ONDANSETRON HCL 4 MG/2ML IJ SOLN
4 MG/2ML | Freq: Four times a day (QID) | INTRAMUSCULAR | Status: DC | PRN
Start: 2016-04-11 — End: 2016-04-12

## 2016-04-11 MED ORDER — DILTIAZEM HCL 25 MG/5ML IV SOLN
25 MG/5ML | Freq: Once | INTRAVENOUS | Status: DC
Start: 2016-04-11 — End: 2016-04-12

## 2016-04-11 MED ORDER — SODIUM CHLORIDE 0.9 % IV BOLUS
0.9 % | Freq: Once | INTRAVENOUS | Status: AC
Start: 2016-04-11 — End: 2016-04-11
  Administered 2016-04-11: 19:00:00 500 mL via INTRAVENOUS

## 2016-04-11 MED ORDER — DEXTROSE 5 % IV SOLN (MINI-BAG)
5 % | Freq: Three times a day (TID) | INTRAVENOUS | Status: DC
Start: 2016-04-11 — End: 2016-04-12
  Administered 2016-04-11 – 2016-04-13 (×5): 3.375 g via INTRAVENOUS

## 2016-04-11 MED ORDER — REGADENOSON 0.4 MG/5ML IV SOLN
0.4 MG/5ML | Freq: Once | INTRAVENOUS | Status: DC
Start: 2016-04-11 — End: 2016-04-12

## 2016-04-11 MED ORDER — SODIUM CHLORIDE 0.9 % IV BOLUS
0.9 % | Freq: Once | INTRAVENOUS | Status: AC
Start: 2016-04-11 — End: 2016-04-11
  Administered 2016-04-11: 500 mL via INTRAVENOUS

## 2016-04-11 MED ORDER — DIPHENHYDRAMINE HCL 50 MG/ML IJ SOLN
50 MG/ML | INTRAMUSCULAR | Status: AC
Start: 2016-04-11 — End: 2016-04-11

## 2016-04-11 MED ORDER — DEXTROSE-NACL 5-0.9 % IV SOLN
INTRAVENOUS | Status: DC
Start: 2016-04-11 — End: 2016-04-12
  Administered 2016-04-11 – 2016-04-12 (×3): via INTRAVENOUS

## 2016-04-11 MED FILL — KCL IN DEXTROSE-NACL 20-5-0.45 MEQ/L-%-% IV SOLN: INTRAVENOUS | Qty: 1000

## 2016-04-11 MED FILL — PIPERACILLIN SOD-TAZOBACTAM SO 3.375 (3-0.375) G IV SOLR: 3.375 (3-0.375) g | INTRAVENOUS | Qty: 3.38

## 2016-04-11 MED FILL — NORMAL SALINE FLUSH 0.9 % IV SOLN: 0.9 % | INTRAVENOUS | Qty: 20

## 2016-04-11 MED FILL — SODIUM CHLORIDE 0.9 % IJ SOLN: 0.9 % | INTRAMUSCULAR | Qty: 10

## 2016-04-11 MED FILL — THIAMINE HCL 100 MG/ML IJ SOLN: 100 MG/ML | INTRAMUSCULAR | Qty: 2

## 2016-04-11 MED FILL — MAGNESIUM SULFATE 2 GM/50ML IV SOLN: 2 GM/50ML | INTRAVENOUS | Qty: 50

## 2016-04-11 MED FILL — HEPARIN SODIUM (PORCINE) 10000 UNIT/ML IJ SOLN: 10000 UNIT/ML | INTRAMUSCULAR | Qty: 1

## 2016-04-11 MED FILL — SODIUM PHOSPHATES 15 MMOLE/5ML IV SOLN: 15 MMOLE/5ML | INTRAVENOUS | Qty: 6.67

## 2016-04-11 MED FILL — DILTIAZEM HCL 100 MG IV SOLR: 100 MG | INTRAVENOUS | Qty: 1

## 2016-04-11 MED FILL — VANCOMYCIN HCL 1000 MG IV SOLR: 1000 MG | INTRAVENOUS | Qty: 1500

## 2016-04-11 MED FILL — NORMAL SALINE FLUSH 0.9 % IV SOLN: 0.9 % | INTRAVENOUS | Qty: 30

## 2016-04-11 MED FILL — FAMOTIDINE 20 MG/2ML IV SOLN: 20 MG/2ML | INTRAVENOUS | Qty: 2

## 2016-04-11 MED FILL — FENTANYL CITRATE (PF) 100 MCG/2ML IJ SOLN: 100 MCG/2ML | INTRAMUSCULAR | Qty: 2

## 2016-04-11 MED FILL — NORMAL SALINE FLUSH 0.9 % IV SOLN: 0.9 % | INTRAVENOUS | Qty: 50

## 2016-04-11 MED FILL — THIAMINE HCL 100 MG/ML IJ SOLN: 100 MG/ML | INTRAMUSCULAR | Qty: 1

## 2016-04-11 MED FILL — VANCOMYCIN 1500 MG IN DEXTROSE 5% 300 ML IVPB: Qty: 1.5

## 2016-04-11 MED FILL — DIPHENHYDRAMINE HCL 50 MG/ML IJ SOLN: 50 MG/ML | INTRAMUSCULAR | Qty: 1

## 2016-04-11 MED FILL — ACEPHEN 650 MG RE SUPP: 650 MG | RECTAL | Qty: 1

## 2016-04-11 MED FILL — LORAZEPAM 2 MG/ML IJ SOLN: 2 MG/ML | INTRAMUSCULAR | Qty: 1

## 2016-04-11 NOTE — Progress Notes (Signed)
Subjective:    Events of evening reviewed.  Patient very confused.  Status post EGD (04/06/2016).  Patient not eating.  No nausea or vomiting.    Objective:    BP (!) 148/86    Pulse 125    Temp 99.8 ??F (37.7 ??C) (Axillary)    Resp (!) 32    Ht 5\' 5"  (1.651 m)    Wt 178 lb 5.6 oz (80.9 kg)    SpO2 95%    BMI 29.68 kg/m??     Heart:  RRR, no murmurs, gallops, or rubs.  Lungs:  CTA bilaterally, no wheeze, rales or rhonchi  Abd: bowel sounds present, nontender, nondistended, no masses  Extrem:  No clubbing, cyanosis, or edema    CBC with Differential:    Lab Results   Component Value Date    WBC 8.1 04/11/2016    RBC 4.11 04/11/2016    HGB 12.4 04/11/2016    HCT 37.0 04/11/2016    PLT 140 04/11/2016    MCV 90.0 04/11/2016    MCH 30.2 04/11/2016    MCHC 33.5 04/11/2016    RDW 17.3 04/11/2016    LYMPHOPCT 11.4 04/11/2016    MONOPCT 8.1 04/11/2016    BASOPCT 0.2 04/11/2016    MONOSABS 0.66 04/11/2016    LYMPHSABS 0.92 04/11/2016    EOSABS 0.00 04/11/2016    BASOSABS 0.02 04/11/2016     CMP:    Lab Results   Component Value Date    NA 132 04/11/2016    K 4.5 04/11/2016    CL 100 04/11/2016    CO2 21 04/11/2016    BUN 4 04/11/2016    CREATININE 0.6 04/11/2016    GFRAA >60 04/11/2016    LABGLOM >60 04/11/2016    GLUCOSE 105 04/11/2016    PROT 5.0 04/11/2016    LABALBU 2.0 04/11/2016    CALCIUM 8.1 04/11/2016    BILITOT 2.8 04/11/2016    ALKPHOS 252 04/11/2016    AST 66 04/11/2016    ALT 36 04/11/2016     BMP:    Lab Results   Component Value Date    NA 132 04/11/2016    K 4.5 04/11/2016    CL 100 04/11/2016    CO2 21 04/11/2016    BUN 4 04/11/2016    LABALBU 2.0 04/11/2016    CREATININE 0.6 04/11/2016    CALCIUM 8.1 04/11/2016    GFRAA >60 04/11/2016    LABGLOM >60 04/11/2016    GLUCOSE 105 04/11/2016     Magnesium:    Lab Results   Component Value Date    MG 1.6 04/11/2016     Phosphorus:    Lab Results   Component Value Date    PHOS 1.8 04/11/2016     PT/INR:    Lab Results   Component Value Date    PROTIME 22.6 04/03/2016     INR 2.0 04/03/2016     PTT:    Lab Results   Component Value Date    APTT 55.8 04/06/2016   [APTT}     Assessment:    Patient Active Problem List   Diagnosis   ??? Moderate protein-calorie malnutrition (HCC)   ??? V-tach [facility](HCC)   ??? COPD (chronic obstructive pulmonary disease) (HCC)   ??? NSTEMI (non-ST elevated myocardial infarction) (HCC)   ??? CAD (coronary artery disease), native coronary artery   ??? Hyperlipidemia LDL goal <100   ??? PUD (peptic ulcer disease)   ??? Dementia   ??? Acquired hypothyroidism   ???  Hypokalemia   ??? Persistent atrial fibrillation (HCC)   ??? Esophageal ring   ??? Sepsis (HCC)   ??? UTI (urinary tract infection)       Plan:  Supplement diet.  EP on board.  Continue breathing treatments and supplemental oxygne.  Cardiology on board.  Early conservative therapy chosen.  Continue medical management of ascad.  Continue aggressive lipid therapy.  PPI therapy.  Stable.  Continue synthroid.  Supplemented.  Rate controlled.  Elevated HR.  Cardiology and EP on board.  GI evaluation appreciated.  Status post EGD.   Secondary to UTI.  Antibiotics.  As above.   Patient continues to decline.  Poor outcome anticipated.  Palliative care on board.       Minda Dittoobert J Caressa Scearce  MD  7:08 AM  04/11/2016

## 2016-04-11 NOTE — Progress Notes (Signed)
Dr. Maple HudsonMoser returned page. Ativan given-see order. Patient now resting comfortably in bed. Will continue to monitor.

## 2016-04-11 NOTE — Plan of Care (Signed)
Problem: Falls - Risk of  Goal: Absence of falls  Outcome: Met This Shift      Problem: Risk for Impaired Skin Integrity  Goal: Tissue integrity - skin and mucous membranes  Structural intactness and normal physiological function of skin and  mucous membranes.   Outcome: Met This Shift

## 2016-04-11 NOTE — Progress Notes (Signed)
RRT called at 0513 d/t patient's decline in mental status, new onset of fever, rash, and tachypnea. MEWS 5. Patient aroused by sternal rub but then quickly becomes unresponsive again. Patient has a red, blotchy, seeping rash that is rapidly spreading from her thigh down her leg and wrapping behind leg. Patient was placed on 2L O2 for comfort. Crash cart at bedside. Patient on monitor.

## 2016-04-11 NOTE — Plan of Care (Signed)
Problem: Restraint Use - Nonviolent/Non-Self-Destructive Behavior:  Goal: Absence of restraint indications  Absence of restraint indications   Outcome: Not Met This Shift    Goal: Absence of restraint-related injury  Absence of restraint-related injury   Outcome: Met This Shift

## 2016-04-11 NOTE — Progress Notes (Signed)
Contacted Dr. Maple HudsonMoser regarding patient's new rash, fever, and tachycardia. Patient has a mews of 5. Awaiting call back

## 2016-04-11 NOTE — Progress Notes (Addendum)
[facility]  Department of Internal Medicine   MICU H&P Note    Patient:  [name] 82 y.o. female   MRN: [id number]       Date of Service: 04/11/2016      Chief Complaint:  AMS, new-developed rash    History of Present Illness   History is obtained from the EMR; limited due to patient condition.     82 year old female with PMH of A-fib on Dabigatran and Digoxin, COPD, GERD, HL, hypothyroidism admitted to the hospital on 04/03/16 for AMS and Vtach arrest and placed on CVIC. Possible malnourishment caused hypokalemia and QTc prolongation was thought as the primary causes of that event. Cardiac cath deferred due to oral intake has not been established since admission which EGD showed 3 duodenal ulcers. TTE showed good biventricular function. After the CVIC observation she was transferred to the telemetry floor.     RRT was called due to new developed erythematous rash on the right inner thigh which occurred after Ativan administration and fever. Fecal management system showed loose mucoid stools. Urine was looking dirty. She also had AMS which is likely her baseline. ABG didn't show any respiratory distress. She is transferred to the medical ICU for further management for possible sepsis.       Past Medical History:      Diagnosis Date   ??? A-fib (HCC)    ??? Anticoagulated     on pradaxa   ??? Asthma    ??? COPD (chronic obstructive pulmonary disease) (HCC)    ??? Depression    ??? Forgetfulness     capable of signing own consents   ??? GERD (gastroesophageal reflux disease)    ??? HOH (hard of hearing)     no aides   ??? Hyperlipidemia    ??? Pain     bilateral upper quadrant /  for EGD   ??? Restless leg syndrome    ??? Thyroid disease        Past Surgical History:        Procedure Laterality Date   ??? ABDOMEN SURGERY  02/19/2016    laprascopic with graham patch of gastric ulcer   ??? BACK SURGERY  2010?    lumbar    ??? BREAST SURGERY      Lumpectomy   ??? ENDOSCOPY, COLON, DIAGNOSTIC  02/28/2015   ??? HYSTERECTOMY  1970's   ??? JOINT  REPLACEMENT Bilateral 2011?    knee   ??? VARICOSE VEIN SURGERY Bilateral 2012       Prior to Admission medications    Medication Sig Start Date End Date Taking? Authorizing Provider   ondansetron (ZOFRAN) 4 MG tablet Take 1 tablet by mouth every 8 hours as needed for Nausea or Vomiting 02/26/16  Yes Willadean Carol, MD   omeprazole (PRILOSEC) 40 MG delayed release capsule Take 1 capsule by mouth daily 02/28/15  Yes Erling Conte, MD   MULTIPLE VITAMIN PO Take by mouth daily   Yes Historical Provider, MD   buPROPion [facility] SR) 150 MG extended release tablet Take 150 mg by mouth nightly   Yes Historical Provider, MD   donepezil (ARICEPT) 10 MG tablet Take 10 mg by mouth nightly   Yes Historical Provider, MD   simvastatin (ZOCOR) 20 MG tablet Take 20 mg by mouth nightly.     Yes Historical Provider, MD   levothyroxine (SYNTHROID) 50 MCG tablet Take 50 mcg by mouth daily.     Yes Historical Provider, MD  citalopram (CELEXA) 20 MG tablet Take 20 mg by mouth daily Instructed to take with sip water am of procedure   Yes Historical Provider, MD   montelukast (SINGULAIR) 10 MG tablet Take 10 mg by mouth nightly.     Yes Historical Provider, MD   raloxifene (EVISTA) 60 MG tablet Take 60 mg by mouth daily.     Yes Historical Provider, MD   pregabalin (LYRICA) 150 MG capsule Take 150 mg by mouth 2 times daily Instructed to take with sip water am of procedure   Yes Historical Provider, MD   digoxin (LANOXIN) 0.125 MG tablet Take 125 mcg by mouth every 48 hours Takes at night every other day   Yes Historical Provider, MD   dabigatran (PRADAXA) 150 MG capsule Take 150 mg by mouth 2 times daily.     Yes Historical Provider, MD   albuterol sulfate HFA 108 (90 BASE) MCG/ACT inhaler Inhale 2 puffs into the lungs every 6 hours as needed for Wheezing (last took this am)    Historical Provider, MD   omeprazole (PRILOSEC) 20 MG delayed release capsule Take 2 capsules by mouth 2 times daily for 14 days 01/19/15 02/02/15  Delories Heinz, MD    ipratropium-albuterol (DUONEB) 0.5-2.5 (3) MG/3ML SOLN nebulizer solution Inhale 1 vial into the lungs every 4 hours Uses prn / instructed if needs to use dos    Historical Provider, MD       Allergies:  Aspirin; Chicken allergy; Eggs or egg-derived products; and Sulfa antibiotics    Social History:   TOBACCO:   reports that she has never smoked. She has never used smokeless tobacco.  ETOH:   reports that she does not drink alcohol.  OCCUPATION:      Family History:   History reviewed. No pertinent family history.     REVIEW OF SYSTEMS:  Not obtained due to patient condition    Physical Exam       Physical Exam:  ?? Vitals: BP 124/84    Pulse 124    Temp 101 ??F (38.3 ??C)    Resp (!) 32    Ht 5\' 5"  (1.651 m)    Wt 173 lb (78.5 kg)    SpO2 95%    BMI 28.79 kg/m??     ?? I & O - 24hr: I/O this shift:  ?? In: -   ?? Out: 900 [Urine:900]   ?? General Appearance: mild distress, moderate distress and uncooperative  ?? HEENT:  Head: Normocephalic, no lesions, without obvious abnormality.  ?? Neck: supple, symmetrical, trachea midline  ?? Lung: clear to auscultation bilaterally  ?? Heart: irregularly irregular rhythm  ?? Abdomen: soft, non-tender; bowel sounds normal; no masses,  no organomegaly  ?? Extremities:  right inner thigh eythematous rash extending on the medial surface towards the knee  ?? Musculokeletal: No joint swelling, no muscle tenderness. ROM normal in all joints of extremities.   ?? Neurologic: Mental status: alert, oriented to her name    Labs     CBC:   Lab Results   Component Value Date    WBC 8.7 04/09/2016    RBC 4.27 04/09/2016    HGB 12.6 04/09/2016    HCT 39.3 04/09/2016    PLT 125 04/09/2016    MCV 92.0 04/09/2016     BMP:    Lab Results   Component Value Date    NA 137 04/09/2016    K 4.16 04/11/2016    CL 102 04/09/2016  CO2 21 04/09/2016    BUN 8 04/09/2016    CREATININE 0.6 04/09/2016    GLUCOSE 78 04/09/2016    CALCIUM 7.7 04/09/2016     Hepatic Function Panel:    Lab Results   Component Value Date     ALKPHOS 227 04/06/2016    AST 102 04/06/2016    ALT 44 04/06/2016    PROT 4.8 04/06/2016    LABALBU 2.1 04/06/2016    BILITOT 2.8 04/06/2016     Cardiac Enzymes:   Lab Results   Component Value Date    TROPONINI 0.04 (H) 04/03/2016    TROPONINI 0.03 04/03/2016    TROPONINI <0.01 02/19/2016     PT/INR:    Lab Results   Component Value Date    PROTIME 22.6 04/03/2016    INR 2.0 04/03/2016     ABG:  No results found for: PHART, PCO2ART, PO2ART, O2SATART, HCO3ART, BEART  TSH:    Lab Results   Component Value Date    TSH 2.900 04/03/2016        Notable Cultures:       Culture  Date sent  Result    Nasal      Sputum      Stool 12/17     Urine Strep pneumonia Ag        Urine Legionella Ag        Blood  12/17      Urine  12/17        Antibiotic  Days  Day started     Vanc- 1 dose    12/17      Zosyn    12/17                   ABG   Vent Settings  NC-2 L   PH  7.52 Mode      PCO2  22.4 TV      PO2  73.1 RR      HCO3  18.3 PS      Sat%   PEEP      FIO2   FIO2        P/F          Lines:  Site  Day  Date inserted     TLC   R IJ    12/17       PICC              Arterial line              Peripheral line                           DVT Prophylaxis      Heparin    x     Enoxaparin        PCDs        Imaging studies:   Imaging  Date  Result    CXR  12/17  b/l effusions + edema                   EKG: Rhythm Strip: afib rvr.    Resident's Assessment & Plan     Neurology  Acute on chronic metabolic encephalopathy likely 2/2 possible sepsis in the setting of dementia  Possible Delirium  - Need for ativan overnight for agitation  - Usually alert, oriented to self  - No respiratory distress noted  - Daily neuro assessment  - Consider imaging studies if no  improvement    Cardiovascular  AfibRVR  NSTEMI  Possible volume overload  - On Dabigatran, digoxin at home. Dabigatran on hold in hospital. Has been on heparin drip at some point. Consider anticoagulation.   - AFRVR noted during RRT  - TTE showed good biventricular function  - Heart cath  could not be done due to patient's dysphagia; which avoids her to take po meds needed after heart cath.   - Not a good candidate for ICD-discussion noted; not a good candidate per EP since the life expectancy less than 1 year  - Started on Cardizem drip without bolus; continue hemodynamic monitoring- discontinue when rate controlled  - Consider Lasix if pulmonary function deteriorates due to volume overload  - Trend cardiac enzymes q6h    Pulmonary  Volume overload  - B/l pleural effusions on the CXR  - ABG not remarkable for respiratory distress  - Consider Lasix if pulm function worsens    Renal  Lactic acidosis  - likely 2/2 sepsis  - Continue ivf  - LA q6h    Infectious Disease  Possible sepsis likely 2/2 possible UTI vs possible cellulitis  Possible Sepsis  - qSOFA 2 with AMS  - Multiple possible infectious sources;     - stool loose-Cdiff pending, continue isolation.     - Urine was looking dirty, UA pending    - New developed rash- possible cellulitis?     - CXR increased opacities, possible HCAP?  - Procalcitonin 0.26, mildly significant  - Pancultures pending  - IVF, sepsis protocol  - Vanc for one dose and Zosyn.     Gastroenterology  Dysphagia  - Not clear etiology  - GI following  - Speech path pending  - Denies offered food  - EGD showed 3 duodenal ulcers; on PPI    Cirrhosis with unclear etio, possible NASH?  - Continue ivf, monitoring Hep panel  - GI is following    DVT/GI prophylaxis  Pepcid&Heparin      Baldomero LamyMustafa Tunc, M.D. , PGY-1  Internal Medicine    Attending Physician: Dr. Leretha DykesAl Zoby  I personally saw, examined and provided care for the patient. Radiographs, labs and medication list were reviewed by me independently. I spoke with bedside nursing, therapists and consultants. Critical care services and times documented are independent of procedures and multidisciplinary rounds with Residents. Additionally comprehensive, multidisciplinary rounds were conducted with the MICU team. The case was discussed  in detail and plans for care were established. Review of Residents documentation was conducted and revisions were made as appropriate. I agree with the above documented exam, problem list and plan of care.    Sepsis source still not clear to me   Lactic acidosis with hyper ventilation ,also recived ativan   Will ultrasound liver and gall bladder and kidney   Will  Continue abx and pan culture  Discuss with family and son on phone  Prognosis guarded   Hold on benzo or pain medicne  Serial lactate and check procalcitinon   Later call for possible port vein thrombosis ,start LMWH will get vascular involved in am ,CT  with contrast if more stable in am .

## 2016-04-11 NOTE — Procedures (Signed)
[facility]  Department of Internal Medicine  Internal Medicine Residency Program  Procedure Note    Procedure:  Insertion of TLC via  Right internal jugular vein    Indications:  Hemodynamic/CVP monitoring and IV access    Anesthesia: Local infiltration of 1% lidocaine.     Consent:  Informed written consent obtained.     Technique:  Procedure was done using strict aseptic technique. The patient's neck was prepped and draped in the usual sterile fashion.  Ultrasound was used to identify the jugular vein. This area of the neck overlying the IJ was injected with 1% lidocaine. Then using the Seldinger technique, a large-bore needle was placed into the internal jugular vein with good backflow. A guidewire was placed. Then using an 11 blade, a small incision was made in the patient's skin. The large-bore needle was removed. A dilator was passed over the guidewire. Once completed, a triple lumen catheter was placed over the guidewire with the guidewire then subsequently removed. All three ports were drawn and flushed with good flow. Then the catheter was sutured in place, and a sterile dressing was applied.     Number of sticks: 1.     Number of Kits used: 1.     Complications: No immediate complication.     Estimated blood loss: About 3 ml.     Comment: Patient tolerated the procedure well.     Placement:  CXR was ordered to confirm placement and is in good position.    Nancie NeasNingjing Li, MD. PGY 3  ICU Attending: Dr. Leretha DykesAl Zoby

## 2016-04-11 NOTE — Other (Signed)
Resident notified of 140cc urine output 3-11pm. No orders received.

## 2016-04-11 NOTE — Progress Notes (Signed)
Left message for Dr Maple HudsonMoser regarding Pt increased agitation, combativeness, hallucinations, and confusion. Awaiting return phone call. Dorthea Cove.Abigail Rupert RN

## 2016-04-11 NOTE — Other (Signed)
Patient was RRT from the sixth floor, for diarrhea, fever, cellulites to right leg, and altered mental status, patient admitted to [id number], resident at bedside to place central line.

## 2016-04-11 NOTE — Progress Notes (Signed)
Residents notified of abnormal ultrasound results  Pauletta BrownsGregg Sita Mangen  04/11/16  4:42 PM

## 2016-04-11 NOTE — Other (Signed)
Pt removing 02 tubing multiple times and reaching for TLC despite re-direction. Pt highly agitated and confused. Pt stated "If you come near me, I will kick you." Bilateral soft wrist restraints started and resident notified. Pt's daughter, [name], called and notified.

## 2016-04-11 NOTE — Progress Notes (Signed)
[facility] Family notified and spoke with physician. Dr. Maple HudsonMoser notified and spoke with physician.

## 2016-04-11 NOTE — Progress Notes (Signed)
Internal medicine resident notified of lactic acid result.  Sharlot GowdaGregg Rion Catala  04/11/16  1:43 PM

## 2016-04-11 NOTE — Significant Event (Signed)
Rapid Response Team Note  Date of event: 04/11/2016   Time of event: 0509  [name] 82 y.o. year old female   Date of birth: Jan 19, 1928   Admit date:  04/03/2016   Location: 4427/4427-PR   Witnessed? : [x] Yes  []  No  Monitored? : [x] Yes  []  No  Code status: [x]  Full  []  DNR-CCA  [] DNR-CC  ______________________________________________________________________  Reason for RRT:    []  RR < 8     []  RR > 28   []  SpO2 <90%   []  HR < 40 bpm   [x]  HR > 120 bpm  []  SBP < 90 mmHg    []  SpO2 <90%   []  LOC   []  Seizures    []  Significant Bleeding Event    [x]  Other: altered mental status    Subjective:   Patient had vfib arrest and [facility] She was transferred out to the tele floor 3 days ago. She was combative and agitated so the RN gave her ativan. Then she developed rash on her right thigh. She also spiked a fever of 101 and was given tylenol suppository. RRT was called because patient was drowsy, HR of 120s,and tachypnea. Patient did not desaturate but RN put her on NC. Upon arrival the patient looked lethargic. She did not answer questions but did open her eyes when asked to. Patient was yelling to sternal rub and painful stimuli. She was moving all extremities. No facial droop noticed. She has extensive rash from right inner thigh to inner calf. It was red, warm and sipping fluid. Per RN, the rash only showed after giving Ativan. After being given benadryl, the rash improved. Per RN, patient has been having watery diarrhea for the past two days and hence a FMS was inserted. She also has a foley catheter which drains cloudy urine.      EKG showe Afib RVR. CXR showed bilateral pleural effusion; there is enlargement of mediastinum however this is not new compared to previous CXR. ABG suggested respiratory alkalosis. STAT labs ordered as well as pan culture.     Objective:   Vital signs: Temp: 100.2/ BP: 110s//RR: 30/HR: 124  Initial Condition:  Conscious   []  Yes  [x]  No     Breathing [x]  Yes  []   No     Pulse  [x]  Yes  []  No    Airway:   [x]  Open/ Clear     Intervention: [x]  None  []  Pooled secretions     []  Suctioned  []  Stridor      []  Intubation    Lungs:   [x]  Symmetrical chest rise/ CTABL Intervention: []  None  []  Use of accessory muscles    []  NIV (CPAP/BiPAP)  []  Cyanosis      [x]  Nasal Oxygen/Mask  []  Wheezing       [x]  ABG             [x]  CXR  []  Other:     Circulation:   Rhythm:  []  Sinus [x]  Other: Afib RVR  Intervention: []  None            []  IV Access  []  Peripheral              []  Central            [x]  EKG            []  Cardioversion            []  Defibrillation  Capillary Refill:  []  > 2 seconds [x]  < 2 seconds    Neurologic:   []  NIHSS      [x]  Pupillary Response: reactive   Response to pain:   [x]  Yes  []  No  Follow commands:  []  Yes  [x]  No  Facial asymmetry:  []  Yes  [x]  No  Motor strength:  []  Equal  []  Focal deficit:    Diagnostic Test:  Blood Sugar:  105  mg/dL  EKG:    afib RVR  ABG:      Lab Results   Component Value Date    PH 7.529 04/11/2016    PCO2 22.4 04/11/2016    PO2 73.1 04/11/2016    HCO3 18.3 04/11/2016    O2SAT 95.7 04/11/2016     Medication(s) Given:  []   Narcan (Naloxone)   []   Romazicon (Flumazenil)    []   Breathing Treatment    []   Steroids (Prednisone, Solu-Medrol)  []   Adenosine  [x]  Cardiac Medicines: cardizem drip      Infusion(s):   []  Normal Saline    Amount:  100 cc/hr      []  Lactate Ringers      [x]  Other:  D5 half normal     Imaging:   [x]  CXR:   [x]  unchanged         []  Pneumothorax         []  Pulmonary Edema  []  Infiltrate          []  CT Head  []  Normal          []  ICB          []  SAH          []  Other:     Laboratory Tests:     CBC with Differential:    Lab Results   Component Value Date    WBC 8.1 04/11/2016    RBC 4.11 04/11/2016    HGB 12.4 04/11/2016    HCT 37.0 04/11/2016    PLT 140 04/11/2016    MCV 90.0 04/11/2016    MCH 30.2 04/11/2016    MCHC 33.5 04/11/2016    RDW 17.3 04/11/2016    LYMPHOPCT 11.4 04/11/2016    MONOPCT 8.1 04/11/2016     BASOPCT 0.2 04/11/2016    MONOSABS 0.66 04/11/2016    LYMPHSABS 0.92 04/11/2016    EOSABS 0.00 04/11/2016    BASOSABS 0.02 04/11/2016     CMP:    Lab Results   Component Value Date    NA 132 04/11/2016    K 4.5 04/11/2016    CL 100 04/11/2016    CO2 21 04/11/2016    BUN 4 04/11/2016    CREATININE 0.6 04/11/2016    GFRAA >60 04/11/2016    LABGLOM >60 04/11/2016    GLUCOSE 105 04/11/2016    PROT 5.0 04/11/2016    LABALBU 2.0 04/11/2016    CALCIUM 8.1 04/11/2016    BILITOT 2.8 04/11/2016    ALKPHOS 252 04/11/2016    AST 66 04/11/2016    ALT 36 04/11/2016     Magnesium:    Lab Results   Component Value Date    MG 1.6 04/11/2016     Phosphorus:    Lab Results   Component Value Date    PHOS 1.8 04/11/2016     Troponin:    Lab Results   Component Value Date    TROPONINI 0.04 04/03/2016     ABG:    Lab Results  Component Value Date    PH 7.529 04/11/2016    PCO2 22.4 04/11/2016    PO2 73.1 04/11/2016    HCO3 18.3 04/11/2016    BE -2.6 04/11/2016    O2SAT 95.7 04/11/2016     Teams Assessment and Plan:  1.  Acute encephalopathy: likely due to Benzo vs sepsis: treat underlying causes, monitor neuro status   2. Fever: r/o sepsis, source of infection including urine and GI, sent c.diff, UA, stool, urine and blood culture, procalcitonin, check lactic acid. Consider thoracentesis  3. Afib RVR, check mag and K. Started on cardizem gtt without bolus due to borderline hypotension. Trend cardiac enzymes.     Disposition:  []  No transfer   []  Transfer to monitor floor  [x]  Transfer to: [x]  MICU []  NICU []  CCU []  SICU    Patient???s family updated: [x]  Yes Discussed with patient's daughter. She would like patient to be a full code. []  No   Discussed with:  [x]  Critical Care Intensivist: Dr. Leretha DykesAl Zoby      [x]  Primary Care Provider: Dr. Maple HudsonMoser      []  Other:  ?    Nancie NeasNingjing Li MD PGY-3  04/11/2016 7:16 AM  Attending Physician: Dr. Leretha DykesAl Zoby

## 2016-04-12 ENCOUNTER — Inpatient Hospital Stay: Admit: 2016-04-12 | Primary: Family Medicine

## 2016-04-12 ENCOUNTER — Encounter: Attending: Cardiovascular Disease | Primary: Family Medicine

## 2016-04-12 LAB — BLOOD GAS, ARTERIAL
B.E.: -6.8 mmol/L — ABNORMAL LOW (ref <=3.0)
B.E.: -7.2 mmol/L — ABNORMAL LOW (ref <=3.0)
COHb: 0.4 % (ref 0.0–1.5)
COHb: 0.3 % (ref 0.0–1.5)
Date Analyzed: 20171218
Date Analyzed: 20171218
Date Of Collection: 20171218
Date Of Collection: 20171218
HCO3: 16.7 mmol/L — ABNORMAL LOW (ref 22.0–26.0)
HCO3: 15.9 mmol/L — ABNORMAL LOW (ref 22.0–26.0)
HHb: 5.6 % — ABNORMAL HIGH (ref 0.0–5.0)
HHb: 6.1 % — ABNORMAL HIGH (ref 0.0–5.0)
Lab: 9558
Lab: 9558
MetHb: 0.4 % (ref 0.0–1.5)
MetHb: 0.4 % (ref 0.0–1.5)
O2 Content: 14.8 mL/dL
O2 Content: 15.8 mL/dL
O2 Sat: 94.4 % (ref 92.0–98.5)
O2 Sat: 93.9 % (ref 92.0–98.5)
O2Hb: 93.6 % — ABNORMAL LOW (ref 94.0–97.0)
O2Hb: 93.2 % — ABNORMAL LOW (ref 94.0–97.0)
Operator ID: 45900
Operator ID: 36600
PCO2: 27.2 mmHg — ABNORMAL LOW (ref 35.0–45.0)
PCO2: 25.8 mmHg — ABNORMAL LOW (ref 35.0–45.0)
PO2: 74.5 mmHg (ref 60.0–100.0)
PO2: 68.9 mmHg (ref 60.0–100.0)
Pt Temp: 37 C
Pt Temp: 37 C
Time Analyzed: 443
Time Analyzed: 1750
Time Collected: 441
Time Collected: 1749
pH, Blood Gas: 7.405 (ref 7.350–7.450)
pH, Blood Gas: 7.409 (ref 7.350–7.450)
tHb (est): 11.2 g/dL — ABNORMAL LOW (ref 11.5–16.5)
tHb (est): 12 g/dL (ref 11.5–16.5)

## 2016-04-12 LAB — CBC WITH AUTO DIFFERENTIAL
Basophils %: 0.1 % (ref 0.0–2.0)
Basophils Absolute: 0 E9/L (ref 0.00–0.20)
Eosinophils %: 0 % (ref 0.0–6.0)
Eosinophils Absolute: 0 E9/L — ABNORMAL LOW (ref 0.05–0.50)
Hematocrit: 31.2 % — ABNORMAL LOW (ref 34.0–48.0)
Hemoglobin: 10.3 g/dL — ABNORMAL LOW (ref 11.5–15.5)
Lymphocytes %: 2.6 % — ABNORMAL LOW (ref 20.0–42.0)
Lymphocytes Absolute: 0.23 E9/L — ABNORMAL LOW (ref 1.50–4.00)
MCH: 29.9 pg (ref 26.0–35.0)
MCHC: 33 % (ref 32.0–34.5)
MCV: 90.7 fL (ref 80.0–99.9)
MPV: 11.6 fL (ref 7.0–12.0)
Monocytes %: 9.6 % (ref 2.0–12.0)
Monocytes Absolute: 0.78 E9/L (ref 0.10–0.95)
Neutrophils %: 87.7 % — ABNORMAL HIGH (ref 43.0–80.0)
Neutrophils Absolute: 6.86 E9/L (ref 1.80–7.30)
Platelets: 94 E9/L — ABNORMAL LOW (ref 130–450)
RBC: 3.44 E12/L — ABNORMAL LOW (ref 3.50–5.50)
RDW: 17.2 fL — ABNORMAL HIGH (ref 11.5–15.0)
WBC: 7.8 E9/L (ref 4.5–11.5)

## 2016-04-12 LAB — RESPIRATORY PANEL, MOLECULAR
Film Array Adenovirus: NOT DETECTED
Film Array Bordetella Pertusis: NOT DETECTED
Film Array Chlamydophilia Pneumoniae: NOT DETECTED
Film Array Conoravirus NL63: NOT DETECTED
Film Array Coronavirus 229E: NOT DETECTED
Film Array Coronavirus HKU1: NOT DETECTED
Film Array Coronavirus OC43: NOT DETECTED
Film Array Influenza A Virus 09H1: NOT DETECTED
Film Array Influenza A Virus H1: NOT DETECTED
Film Array Influenza A Virus H3: NOT DETECTED
Film Array Influenza A Virus: NOT DETECTED
Film Array Influenza B: NOT DETECTED
Film Array Metapneumovirus: NOT DETECTED
Film Array Mycoplasma Pneumoniae: NOT DETECTED
Film Array Parainfluenza Virus 1: NOT DETECTED
Film Array Parainfluenza Virus 2: NOT DETECTED
Film Array Parainfluenza Virus 3: NOT DETECTED
Film Array Parainfluenza Virus 4: NOT DETECTED
Film Array Respiratory Syncitial Virus: NOT DETECTED
Film Array Rhinovirus/Enterovirus: NOT DETECTED

## 2016-04-12 LAB — PLATELET CONFIRMATION

## 2016-04-12 LAB — LACTIC ACID
Lactic Acid: 3.9 mmol/L — ABNORMAL HIGH (ref 0.5–2.2)
Lactic Acid: 4.2 mmol/L (ref 0.5–2.2)
Lactic Acid: 4.8 mmol/L (ref 0.5–2.2)
Lactic Acid: 5.5 mmol/L (ref 0.5–2.2)

## 2016-04-12 LAB — MAGNESIUM: Magnesium: 1.7 mg/dL (ref 1.6–2.6)

## 2016-04-12 LAB — BASIC METABOLIC PANEL
Anion Gap: 12 mmol/L (ref 7–16)
BUN: 6 mg/dL — ABNORMAL LOW (ref 8–23)
CO2: 18 mmol/L — ABNORMAL LOW (ref 22–29)
Calcium: 7.3 mg/dL — ABNORMAL LOW (ref 8.6–10.2)
Chloride: 102 mmol/L (ref 98–107)
Creatinine: 0.6 mg/dL (ref 0.5–1.0)
GFR African American: >60
GFR Non-African American: >60 mL/min/{1.73_m2} (ref >=60)
Glucose: 98 mg/dL (ref 74–109)
Potassium: 3.4 mmol/L — ABNORMAL LOW (ref 3.5–5.0)
Sodium: 132 mmol/L (ref 132–146)

## 2016-04-12 LAB — PHOSPHORUS: Phosphorus: 3.2 mg/dL (ref 2.5–4.5)

## 2016-04-12 LAB — LEGIONELLA ANTIGEN, URINE: L. pneumophila Serogp 1 Ur Ag: NEGATIVE

## 2016-04-12 LAB — STREP PNEUMONIAE ANTIGEN: STREP PNEUMONIAE ANTIGEN, URINE: NEGATIVE

## 2016-04-12 LAB — ANTISTREPTOLYSIN O TITER: ASO: 32 IU/mL (ref 0–200)

## 2016-04-12 MED ORDER — SODIUM CHLORIDE 0.9 % IV BOLUS
0.9 % | Freq: Once | INTRAVENOUS | Status: AC
Start: 2016-04-12 — End: 2016-04-12
  Administered 2016-04-12: 06:00:00 500 mL via INTRAVENOUS

## 2016-04-12 MED ORDER — POTASSIUM CHLORIDE 20 MEQ/50ML IV SOLN
20 MEQ/50ML | INTRAVENOUS | Status: AC
Start: 2016-04-12 — End: 2016-04-12
  Administered 2016-04-12 (×2): 20 meq via INTRAVENOUS

## 2016-04-12 MED ORDER — DEXTROSE 5 % IV SOLN
5 % | INTRAVENOUS | Status: DC
Start: 2016-04-12 — End: 2016-04-14
  Administered 2016-04-13: 14:00:00 1250 mg/kg via INTRAVENOUS

## 2016-04-12 MED ORDER — IOPAMIDOL 76 % IV SOLN
76 % | Freq: Once | INTRAVENOUS | Status: AC | PRN
Start: 2016-04-12 — End: 2016-04-12
  Administered 2016-04-12: 16:00:00 110 mL via INTRAVENOUS

## 2016-04-12 MED ORDER — LORAZEPAM 2 MG/ML IJ SOLN
2 MG/ML | INTRAMUSCULAR | Status: AC
Start: 2016-04-12 — End: 2016-04-12
  Administered 2016-04-12: 20:00:00 1

## 2016-04-12 MED ORDER — MAGNESIUM SULFATE 2000 MG/50 ML IVPB PREMIX
2 GM/50ML | Freq: Once | INTRAVENOUS | Status: AC
Start: 2016-04-12 — End: 2016-04-12
  Administered 2016-04-12: 14:00:00 2 g via INTRAVENOUS

## 2016-04-12 MED ORDER — DEXTROSE-NACL 5-0.9 % IV SOLN
INTRAVENOUS | Status: DC
Start: 2016-04-12 — End: 2016-04-12

## 2016-04-12 MED ORDER — FUROSEMIDE 10 MG/ML IJ SOLN
10 MG/ML | Freq: Once | INTRAMUSCULAR | Status: AC
Start: 2016-04-12 — End: 2016-04-12
  Administered 2016-04-12: 23:00:00 40 mg via INTRAVENOUS

## 2016-04-12 MED ORDER — LORAZEPAM 2 MG/ML IJ SOLN
2 MG/ML | Freq: Once | INTRAMUSCULAR | Status: DC
Start: 2016-04-12 — End: 2016-04-13

## 2016-04-12 MED ORDER — SODIUM CHLORIDE 0.9 % IV BOLUS
0.9 % | Freq: Once | INTRAVENOUS | Status: DC
Start: 2016-04-12 — End: 2016-04-12

## 2016-04-12 MED ORDER — DEXTROSE 5 % IV SOLN
5 % | Freq: Two times a day (BID) | INTRAVENOUS | Status: DC
Start: 2016-04-12 — End: 2016-04-12
  Administered 2016-04-12: 14:00:00 1250 mg/kg via INTRAVENOUS

## 2016-04-12 MED FILL — POTASSIUM CHLORIDE 20 MEQ/50ML IV SOLN: 20 MEQ/50ML | INTRAVENOUS | Qty: 50

## 2016-04-12 MED FILL — MAGNESIUM SULFATE 2 GM/50ML IV SOLN: 2 GM/50ML | INTRAVENOUS | Qty: 50

## 2016-04-12 MED FILL — NORMAL SALINE FLUSH 0.9 % IV SOLN: 0.9 % | INTRAVENOUS | Qty: 10

## 2016-04-12 MED FILL — PIPERACILLIN SOD-TAZOBACTAM SO 3.375 (3-0.375) G IV SOLR: 3.375 (3-0.375) g | INTRAVENOUS | Qty: 3.38

## 2016-04-12 MED FILL — NORMAL SALINE FLUSH 0.9 % IV SOLN: 0.9 % | INTRAVENOUS | Qty: 40

## 2016-04-12 MED FILL — LOVENOX 80 MG/0.8ML SC SOLN: 80 MG/0.8ML | SUBCUTANEOUS | Qty: 0.8

## 2016-04-12 MED FILL — LORAZEPAM 2 MG/ML IJ SOLN: 2 MG/ML | INTRAMUSCULAR | Qty: 1

## 2016-04-12 MED FILL — FAMOTIDINE 20 MG/2ML IV SOLN: 20 MG/2ML | INTRAVENOUS | Qty: 2

## 2016-04-12 MED FILL — VANCOMYCIN HCL 1000 MG IV SOLR: 1000 MG | INTRAVENOUS | Qty: 1250

## 2016-04-12 MED FILL — FUROSEMIDE 10 MG/ML IJ SOLN: 10 MG/ML | INTRAMUSCULAR | Qty: 4

## 2016-04-12 MED FILL — THIAMINE HCL 100 MG/ML IJ SOLN: 100 MG/ML | INTRAMUSCULAR | Qty: 1

## 2016-04-12 NOTE — Progress Notes (Signed)
Patient is hemoccult positive

## 2016-04-12 NOTE — Plan of Care (Signed)
Problem: Restraint Use - Nonviolent/Non-Self-Destructive Behavior:  Goal: Absence of restraint indications  Absence of restraint indications   Outcome: Not Met This Shift    Goal: Absence of restraint-related injury  Absence of restraint-related injury   Outcome: Met This Shift

## 2016-04-12 NOTE — Progress Notes (Signed)
Nasoenteric tube inserted in right nares and bridled.  Will xray to verify tube placement.

## 2016-04-12 NOTE — Other (Signed)
Resident notified k 3.4

## 2016-04-12 NOTE — Other (Signed)
Returned to 4427 without incident.

## 2016-04-12 NOTE — Other (Signed)
Dr. Alison Murrayansom contacted for new consult.

## 2016-04-12 NOTE — Progress Notes (Signed)
Palliative Medicine   Progress Note  Clinical Nurse Specialist      04/10/16  Admit Date: 04/03/2016    Recommendations:  1. Psychosocial support of patient and family. Able to tell me that she is in hospital, confused to time , no family at bedside. [name] @ [phone]; Teodoro SprayChristie [name] @ [phone] granddaughter; [place] (eldest but estranged)  2. Assist with goals of care.arrhythmia/post cardiac arrest; dementia, malnourishment  3. Await CT of abd  , updated by resident states he spoke with family this am code status changed to DNR-CCA this am     CC:AMS    HPI :  [name]??is a 82 y.o.??female??with significant past medical history of A-fib (on pradaxa; asthma, COPD,, GERD, depression and dementia, HLD??who presented by EMS from home with??altered mental status. ??Patient was found by family altered and barely responsive. ??She was noted to be in??VT on monitoring; was defibrillated which persisted in the??hospital and??was defibrillated again. ??CPR was performed. ??CXR: notes cardiomegaly with pulmonary edema and a small right pleural effusion. CT of head shows no acute??pathology but with chronic microvascular ischemic changes. ??Cardiology and EP consulted. Pt was??very confused but denied??chest pain, shortness of breath, or other acute distress. [facility]. ????Pt was started on amiodarone; fluids, heparin and ASA but it's reported pt not taking orally. Pt underwent EGD on 12/12- noting 3 duodenal ulcers-recommendation for IV H2 blockers. Pt lives with daughter, [name] and has had HHC in the past. Pt is full code.   Palliative Medicine continues to assist pt/family with establishing goals of care; code status discussion; family support and symptom management. ??  ??  ??12/15  [name] is laying in bed somewhat less agitated today. Speech somewhat clearer today; denies pain; states ok to update daughter and says thank you at the end of exam. No family  at present. Again - Message left for daughter; [name] on voicemail.  Again discussed goals of care regarding feeding issue;s ICD placement and code status. .  Pt still refusing to take po meds and is refusing water. Has tele sitter at bedside; pt continues to be agitated and pulling at lines and monitor wires. ??Spoke with staff. Will follow along.  ??Spoke with son and daughter by phone- they are both in agreement to continue full support (full code/ ICD if needed; feeding tube if needed).  Both feel dementia is new and pt not responsive d/t HOH and eye issues.  We talked about her several arrests; other medical problems but feel they want to honor pt's wishes to continue full support.  [facility] Support given.  SSW updated.   ??  12/16      [name] seen in room, appears less confused per bedside nurse, she is eating from her tray , although small amount liquids, no family at bedside     12/17   pt appears to be confused, alert to name , pale jaundiced ALT 36 AST 66 bili 2.8, await CT of abd today     Physical Exam:  Vitals:    BP (!) 111/56    Pulse 86    Temp 97.6 ??F (36.4 ??C) (Axillary)    Resp 26    Ht 5\' 5"  (1.651 m)    Wt 175 lb 7.8 oz (79.6 kg)    SpO2 91%    BMI 29.20 kg/m??    Gen:????Awake; less slurred speech today; HOH; following commands, in  no acute distress  HEENT: Pupils equal and round; reactive to light  Neck:??Supple  Lungs:??CTA bilaterally; no audible rhonchi or wheezes noted  Heart: A-fib;??RRR, no murmur, rub, or gallop noted during exam  Abd:??Soft, non tender, non distended, BS+  Ext:??Moving all extremities, no edema, pulses present  Skin:????Warm and dry  Neuro:??Alert, oriented to self only; ??following commands; confused    ALLERGIES: Aspirin; Chicken allergy; Eggs or egg-derived products; and Sulfa antibiotics    ??? [START ON 04/13/2016] vancomycin  15 mg/kg Intravenous Q24H   ??? sodium chloride flush  10 mL Intravenous 2 times per day   ??? famotidine (PEPCID) injection  20 mg  Intravenous BID   ??? piperacillin-tazobactam  3.375 g Intravenous Q8H   ??? thiamine  100 mg Intravenous Daily   ??? enoxaparin  1 mg/kg Subcutaneous Q12H   ??? miconazole nitrate   Topical BID   ??? sodium chloride (PF)  10 mL Intravenous Daily   ??? sodium chloride flush  10 mL Intravenous 2 times per day       sodium chloride flush, acetaminophen, magnesium hydroxide, miconazole nitrate **AND** miconazole nitrate, mineral oil-hydrophilic petrolatum, sodium chloride flush, trimethobenzamide, ipratropium-albuterol, acetaminophen           Labs     CBC:   Lab Results   Component Value Date    WBC 7.8 04/12/2016    RBC 3.44 04/12/2016    HGB 10.3 04/12/2016    HCT 31.2 04/12/2016    PLT 94 04/12/2016    MCV 90.7 04/12/2016     BMP:    Lab Results   Component Value Date    NA 132 04/12/2016    K 3.4 04/12/2016    CL 102 04/12/2016    CO2 18 04/12/2016    BUN 6 04/12/2016    CREATININE 0.6 04/12/2016    GLUCOSE 98 04/12/2016    CALCIUM 7.3 04/12/2016     PT/INR:    Lab Results   Component Value Date    PROTIME 22.6 04/03/2016    INR 2.0 04/03/2016         Principal Problem:    V-tach [facility])  Active Problems:    Moderate protein-calorie malnutrition (HCC)    COPD (chronic obstructive pulmonary disease) (HCC)    NSTEMI (non-ST elevated myocardial infarction) (HCC)    CAD (coronary artery disease), native coronary artery    Hyperlipidemia LDL goal <100    PUD (peptic ulcer disease)    Dementia    Acquired hypothyroidism    Hypokalemia    Persistent atrial fibrillation (HCC)    Esophageal ring    Sepsis (HCC)    UTI (urinary tract infection)    Cellulitis      Assessment/plan :   [name] is a 82 y.o.female with VT/cardiac arrest requiring defibrillation which persisted in the??hospital and ??was defibrillated again. ????CPR was performed. Admitted for NSTEMI. Cardiology following-may consider MPI if family chooses further aggressive care but pt has had functional decline; dementia and now refusing to take po. Has Elevated liver  enzymes - EGD done noting gastric ulcers. ??Pt remains full code; family to discuss further. ??Family to set up meeting time to establish GOC and further discuss code status. Spoke with staff.  ??Asked daughter, Okey Regal to call me tomorrow to set up family meeting time. Full code  ??  DNR-CCA   Will continue to follow     Vernie Shanks APRN-CNS, [facility] 04/12/2016 11:42 AM    Time Spent:15 min  More than 50% of this interaction was related to counseling or information given regarding goals of care,code status.    Thank you for allowing us to work with you in the care of this patient.   Assessment and plan discussed with Dr. Su HiltMedford Mashburn

## 2016-04-12 NOTE — Other (Signed)
Resident notified of LA 5.5.

## 2016-04-12 NOTE — Progress Notes (Signed)
[facility]  Department of Internal Medicine   MICU Progress Note    Patient:  [name] 82 y.o. female   MRN: [id number]       Date of Service: 04/12/2016    Allergy: Aspirin; Chicken allergy; Eggs or egg-derived products; and Sulfa antibiotics    Subjective   Patient was seen and examined at the bedside. She is alert, confused. Tenderness over abdomen.      Interval Hx:   - Code status changed to DNR-CCA.   - Lactic acidosis on going. Peaked up to 5.6, later decreased to 4.8 with 1 liter of bolus. UO decreased to almost half. Cre stable.   - US abdomen showed liver cirrhosis with portal vein thrombosis. CT w contrast ordered.     Objective     VS:   Vitals:    04/12/16 0800   BP: (!) 77/62   Pulse: 106   Resp: 28   Temp: 97.6 ??F (36.4 ??C)   SpO2:       I & O - 24hr:    Intake/Output Summary (Last 24 hours) at 04/12/16 1112  Last data filed at 04/12/16 0555   Gross per 24 hour   Intake             5225 ml   Output              510 ml   Net             4715 ml     I/O last 3 completed shifts:  In: 5225 [I.V.:5225]  Out: 600 [Urine:600] No intake/output data recorded.   Weight change: -2 lb 13.9 oz (-1.3 kg)    Physical Exam:  ?? Vitals: BP (!) 77/62    Pulse 106    Temp 97.6 ??F (36.4 ??C) (Axillary)    Resp 28    Ht 5\' 5"  (1.651 m)    Wt 175 lb 7.8 oz (79.6 kg)    SpO2 97%    BMI 29.20 kg/m??     ?? I & O - 24hr: No intake/output data recorded.   ??   ?? General Appearance: mild distress, moderate distress and uncooperative  ?? HEENT:  Head: Normocephalic, no lesions, without obvious abnormality.  ?? Neck: supple, symmetrical, trachea midline  ?? Lung: clear to auscultation bilaterally  ?? Heart: irregularly irregular rhythm  ?? Abdomen: soft, tender; bowel sounds normal; no masses,  no organomegaly  ?? Extremities:  right inner thigh eythematous rash extending on the medial surface towards the knee  ?? Musculokeletal: No joint swelling, no muscle tenderness. ROM normal in all joints of extremities.    ?? Neurologic: Mental status: alert, oriented to her name    Medications     Continuous Infusions:   Scheduled Meds:  ??? [START ON 04/13/2016] vancomycin  15 mg/kg Intravenous Q24H   ??? sodium chloride flush  10 mL Intravenous 2 times per day   ??? famotidine (PEPCID) injection  20 mg Intravenous BID   ??? piperacillin-tazobactam  3.375 g Intravenous Q8H   ??? thiamine  100 mg Intravenous Daily   ??? enoxaparin  1 mg/kg Subcutaneous Q12H   ??? miconazole nitrate   Topical BID   ??? sodium chloride (PF)  10 mL Intravenous Daily   ??? sodium chloride flush  10 mL Intravenous 2 times per day     PRN Meds: sodium chloride flush, acetaminophen, magnesium hydroxide, miconazole nitrate **AND** miconazole nitrate, mineral oil-hydrophilic petrolatum, sodium chloride flush,  trimethobenzamide, ipratropium-albuterol, acetaminophen  Nutrition:   NPO    Labs and Imaging Studies     CBC:   Recent Labs      04/11/16   0540  04/12/16   0300   WBC  8.1  7.8   HGB  12.4  10.3*   HCT  37.0  31.2*   MCV  90.0  90.7   PLT  140  94*       BMP:    Recent Labs      04/11/16   0540  04/11/16   1807  04/12/16   0300   NA  132  132  132   K  4.5  4.0  3.4*   CL  100  101  102   CO2  21*  19*  18*   BUN  4*  5*  6*   CREATININE  0.6  0.6  0.6   GLUCOSE  105  110*  98       LIVER PROFILE:   Recent Labs      04/11/16   0540   AST  66*   ALT  36*   BILITOT  2.8*   ALKPHOS  252*       PT/INR:   No results for input(s): PROTIME, INR in the last 72 hours.    APTT:   No results for input(s): APTT in the last 72 hours.    Fasting Lipid Panel:    No results found for: CHOL, TRIG, HDL    Cardiac Enzymes:    Lab Results   Component Value Date    CKTOTAL 41 04/11/2016    CKMB 2.6 04/11/2016    TROPONINI 0.04 (H) 04/03/2016    TROPONINI 0.03 04/03/2016    TROPONINI <0.01 02/19/2016       Notable Cultures:       Culture  Date sent  Result    resp panel 12/17 neg    Sputum 12/17     Body Fluid      Urine Strep pneumonia Ag 12/17       Urine Legionella Ag   12/17     Blood   12/17      Urine  12/17  growth present for GNR        Antibiotic  Days  Day started   Vanc  12/17     Zosyn      12/17                Oxygen:   Nasal cannula L/min     Face mask %     Reservoirs mask %       ??    Lines:  Site  Day  Date inserted     TLC   R IJ    12/17       PICC              Arterial line              Peripheral line              ??         ??    DVT Prophylaxis ??     Heparin    x     Enoxaparin        PCDs      ??  Imaging studies:   Imaging  Date  Result    CXR  12/17  b/l effusions + edema   ??       ??       ??  EKG: Rhythm Strip: afib, rate controlled    Resident's Assessment and Plan     Neurology  Acute on chronic metabolic encephalopathy likely 2/2 possible sepsis in the setting of dementia  Possible Delirium  - Usually alert, oriented to self  - No respiratory distress noted  - Daily neuro assessment  - Consider imaging studies if no improvement  ??  Cardiovascular  AfibRVR, rate controlled, back to afib  NSTEMI  Possible volume overload  - On Dabigatran, digoxin at home. Dabigatran on hold in hospital. Has been on heparin drip at some point. Consider anticoagulation.   - AFRVR noted during RRT  - TTE showed good biventricular function  - Heart cath could not be done due to patient's dysphagia; which avoids her to take po meds needed after heart cath.   - Not a good candidate for ICD-discussion noted; not a good candidate per EP since the life expectancy less than 1 year  - Started on Cardizem drip without bolus; continue hemodynamic monitoring- discontinue d later  - Consider Lasix if pulmonary function deteriorates due to volume overload  ??  Pulmonary  Volume overload  - B/l pleural effusions on the CXR  - ABG not remarkable for respiratory distress  - Consider Lasix if pulm function worsens  ??  Renal  Lactic acidosis  - likely 2/2 sepsis vs intestinal infarct in the setting of portal vein thrombosis  - Continue ivf  - LA q6h  ??  Infectious Disease  Possible sepsis likely 2/2 possible UTI vs  possible cellulitis  Possible Sepsis  - qSOFA 2 with AMS  - Multiple possible infectious sources;                           - stool loose-Cdiff pending, continue isolation.                           - Urine was looking dirty, UA pending                          - New developed rash- possible cellulitis?                           - CXR increased opacities, possible HCAP?  - Procalcitonin 0.26, mildly significant  - Pancultures pending  - IVF, sepsis protocol  - Vanc and Zosyn.   ??  Gastroenterology  Dysphagia  - Not clear etiology  - GI following  - Speech path pending  - Denies offered food  - EGD showed 3 duodenal ulcers; on PPI  ??  Cirrhosis with unclear etio  - Continue ivf, monitoring Hep panel  - GI is following    Portal vein thrombosis  - Lovenox therapeutic dose  - CT abdomen w contrast    DVT/GI prophylaxis  Pepcid&Heparin        Baldomero Lamy, M.D., PGY-1   Internal Medicine    Intensivist: Dr. Cheryll Cockayne    I personally saw, examined and provided care for the patient. Radiographs, labs and medication list were reviewed by me independently. I spoke with bedside nursing, therapists and consultants. Critical care services and times documented are independent of procedures and multidisciplinary rounds with Residents. Additionally comprehensive, multidisciplinary rounds were conducted with the MICU team. The case was discussed in detail and plans for care were established. Review of Residents documentation was  conducted and revisions were made as appropriate. I agree with the above documented exam, problem list and plan of care.    Discussed code status with family at bedside they are agreeable to switch to CCA.   Jules Schick, M.D.   Pulmonary/Critical Care Medicine   41 min cct excluding procedures

## 2016-04-12 NOTE — Other (Signed)
Attempting to remove medical devices,unable to follow direction at this time

## 2016-04-12 NOTE — Other (Signed)
To cat scan on monitor, with RN and transport

## 2016-04-12 NOTE — Other (Signed)
[facility] Quality Flow/Interdisciplinary Rounds Progress Note        Quality Flow Rounds held on April 12, 2016    Disciplines Attending:  Case manager, social work, pt/ot, Nurse, mental healthbedside nurse, charge nurse, nursing management    Michelle FillerRuth E [name] was admitted on 04/03/2016 12:22 PM    Anticipated Discharge Date:  Expected Discharge Date: 04/06/16    Disposition:    Braden Score:  Braden Scale Score: 10    Readmission Risk              Readmission Risk:        16.5       Age 80 or Greater:  1    Admitted from SNF or Requires Paid or Family Care:  0    Currently has CHF,COPD,ARF,CRI,or is on dialysis:  4    Takes more than 5 Prescription Medications:  4    Takes Digoxin,Insulin,Anticoagulants,Narcotics or ASA/Plavix:  2    Hospital Admit in Past 12 Months:  0    On Disability:  3    Patient Considers own Health:  2.5          Discussed patient goal for the day, patient clinical progression, and barriers to discharge.  The following Goal(s) of the Day/Commitment(s) have been identified:  Check transfer.      [name]  April 12, 2016

## 2016-04-12 NOTE — Progress Notes (Signed)
ELECTROPHYSIOLOGY PROGRESS NOTE    [name]  03-01-1928  Date of Service: 04/12/2016  PCP: Otilio SaberJoseph J Gallo, DO  Primary Electrophysiologist: Barnie AldermanAllan Katz, DO  Chief Complaint: PMVT, AF, hypokalemia        Subjective: [name] is seen in hospital follow-up. She remains in the ICU. She is alert however she is difficult to understand, she appears to be confused. No family is present at this time. No events overnight per nursing. Per telemetry she is in A.fib; frequent PVC's . Diltiazem gtt stopped d/t rates noted in 70's. Continues to refuse po's.     Patient Active Problem List   Diagnosis   ??? Moderate protein-calorie malnutrition (HCC)   ??? V-tach [facility](HCC)   ??? COPD (chronic obstructive pulmonary disease) (HCC)   ??? NSTEMI (non-ST elevated myocardial infarction) (HCC)   ??? CAD (coronary artery disease), native coronary artery   ??? Hyperlipidemia LDL goal <100   ??? PUD (peptic ulcer disease)   ??? Dementia   ??? Acquired hypothyroidism   ??? Hypokalemia   ??? Persistent atrial fibrillation (HCC)   ??? Esophageal ring   ??? Sepsis (HCC)   ??? UTI (urinary tract infection)         Scheduled Medications:  ??? magnesium sulfate  2 g Intravenous Once   ??? vancomycin  15 mg/kg Intravenous Q12H   ??? diltiazem  10 mg Intravenous Once   ??? sodium chloride flush  10 mL Intravenous 2 times per day   ??? famotidine (PEPCID) injection  20 mg Intravenous BID   ??? piperacillin-tazobactam  3.375 g Intravenous Q8H   ??? regadenoson  0.4 mg Intravenous Once   ??? thiamine  100 mg Intravenous Daily   ??? enoxaparin  1 mg/kg Subcutaneous Q12H   ??? miconazole nitrate   Topical BID   ??? sodium chloride (PF)  10 mL Intravenous Daily   ??? sodium chloride flush  10 mL Intravenous 2 times per day       Infusion Medications:  ??? diltiazem Stopped (04/12/16 0403)   ??? dextrose 5 % and 0.9 % NaCl 125 mL/hr at 04/12/16 0657       PRN Medications:  sodium chloride flush, acetaminophen, magnesium hydroxide, miconazole nitrate **AND** miconazole nitrate, mineral oil-hydrophilic  petrolatum, sodium chloride flush, trimethobenzamide, ipratropium-albuterol, acetaminophen    Allergies   Allergen Reactions   ??? Aspirin      Reaction unknown   ??? Chicken Allergy      Allergic to poultry (eggs, chicken, [place])   ??? Eggs Or Egg-Derived Products Nausea And Vomiting   ??? Sulfa Antibiotics Nausea And Vomiting       Wt Readings from Last 3 Encounters:   04/12/16 175 lb 7.8 oz (79.6 kg)   02/26/16 156 lb 9.6 oz (71 kg)   02/28/15 181 lb (82.1 kg)     Temp Readings from Last 3 Encounters:   04/12/16 94.2 ??F (34.6 ??C) (Axillary)   02/26/16 98.2 ??F (36.8 ??C) (Oral)   02/28/15 98.6 ??F (37 ??C) (Temporal)     BP Readings from Last 3 Encounters:   04/12/16 (!) 87/50   02/26/16 (!) 105/51   02/28/15 (!) 101/56     Pulse Readings from Last 3 Encounters:   04/12/16 100   02/26/16 67   02/28/15 80         Intake/Output Summary (Last 24 hours) at 04/12/16 0819  Last data filed at 04/12/16 0555   Gross per 24 hour   Intake  5225 ml   Output              580 ml   Net             4645 ml          Physical exam:  Constitutional: BP (!) 87/50    Pulse 100    Temp 94.2 ??F (34.6 ??C) (Axillary)    Resp 27    Ht 5\' 5"  (1.651 m)    Wt 175 lb 7.8 oz (79.6 kg)    SpO2 97%    BMI 29.20 kg/m??    Gen: frail elderly female, in no acute distress   Eyes: conjunctivae normal, no xanthelasma   Ears, Nose, Throat: oral mucosa moist, no cyanosis   Neck: supple, no JVD, no bruits, no thyromegaly   CV: normal rate, irregularly irregular rhythm, normal S1 & S2, No S3 or S4. No murmurs, rubs, or gallops. Peripheral pulses normal   Lungs: clear to auscultation anteriorly, normal respiratory effort   Abdomen: soft, non-tender, bowel sounds present, no masses or hepatomegaly   Extremities: no digital clubbing, RLE erythema/ mild swelling noted. LLE; no edema   Skin: warm   Neuro/Psych: awake and alert, confused      Rhythm strip review: AF, rates 60-70s; no events noted     Recent Labs      04/11/16   0540  04/12/16   0300   WBC  8.1   7.8   HGB  12.4  10.3*   HCT  37.0  31.2*   MCV  90.0  90.7   PLT  140  94*     Recent Labs      04/11/16   0540  04/11/16   1807  04/12/16   0300   NA  132  132  132   K  4.5  4.0  3.4*   CL  100  101  102   CO2  21*  19*  18*   PHOS  1.8*   --   3.2   BUN  4*  5*  6*   CREATININE  0.6  0.6  0.6     No results for input(s): PROTIME, INR in the last 72 hours.  Recent Labs      04/11/16   0540   CKTOTAL  41   CKMB  2.6     No results for input(s): BNP in the last 72 hours.  No results for input(s): CHOL, HDL, TRIG in the last 72 hours.    Invalid input(s): CHOLHDLR, LDLCALCU  PT/INR:    Lab Results   Component Value Date    PROTIME 22.6 04/03/2016    INR 2.0 04/03/2016     TSH:    Lab Results   Component Value Date    TSH 2.900 04/03/2016       Impression:    1. PMVT arrest  - Felt to be secondary to prolonged QT interval and hypokalemia.  - Cardiology is following   - no recurrent PMVT, now off isoproterenol  - K+ being replaced  - Holding all QT prolonging medications: Aricept, celexa, prilosec, zofran  -  AS per Dr Domenic MorasGemma: Reviewed consultant and primary physician's notes, and due to the apparent uncertainty as to whether an ICD is being planned in this patient, I spoke to the patient's son [name] and called her son Christen BameRonnie ??2 but no answer. ICD is not recommended in patients with reversible cause, the most common of  which would be ischemic heart disease, which we have not worked her up for to this point. I do not disagree with cardiology's recommendation against invasive approach given her comorbidities, and will defer to cardiology as to whether myocardial perfusion imaging is of any utility if there are no plans for catheterization anyway.  -  An ICD is clearly NOT an appropriate intervention in a patient with advanced dementia who is refusing to eat and whose life expectancy does not appear to be greater than 1 year. In fact, this seems rather inhumane in my opinion. An ICD is unlikely to result in meaningful  prolongation of her life, but has a high likelihood of causing pain and suffering, as well as risk of complications associated with the ICD implant itself and with her inability to comply with postoperative activity restrictions and follow-up. There is apparently a family discussion later today, although the daughter is not sure which physician they're meeting with, but I would encourage a second opinion from another electrophysiologist, or even an ethics consult if the above is not consistent with overall goals of care.     2. Prolonged QTc  - 2/2 Aricept, celexa, prilosec in combination with hypokalemia  - QT improved  ??  3. Longstanding persistent/permanent AF  - current in AF/Fl   - was on Pradaxa as an OP.  - would use Eliquis 5 mg BID at present--not taking PO  - Previously on IV heparin, now off; receiving therapeutic Lovenox   ??  4. Hypokalemia/ Hypomagnesemia   - continue replacement  - K noted at 3.4; Mg 1.7 per ICU   ??  5. Dementia  - on Aricept as an OP  ??  6. HLD  - Zocor  ??  7. Hypothyroidism  - on synthroid replacement  ??  8. Depression  - on celexa as an OP  ??  9. COPD  ??  Recommendations:  ??  1. Continue to hold aricept, celexa, and prilosec. Also avoid all QT-prolonging medications.  2. Once she is able to take po, would recommend Eliquis 5 mg BID.  3. No plans for ICD implantation; see above   4. Appreciate palliative care input.   5. Please call with questions.      Thank you for allowing me the opportunity to participate in the care of your patient.     Carollee Leitz APRN-CNP  [facility] Cardiac Electrophysiology  [facility]: [place] Electrophysiology

## 2016-04-12 NOTE — Other (Signed)
Dr. Billee CashingPupillo to room, ordering ABGS to be drawn, and labs.  Patient labored breathing, on 4 L NC.

## 2016-04-12 NOTE — Other (Signed)
Patient pulls at lines and tubes when released.  Cannot be redirected at this time.  2 point soft restraints continued for patient safety.  Will continue to monitor

## 2016-04-12 NOTE — Progress Notes (Signed)
Subjective:    Events of evening reviewed.  Patient very confused.  Status post EGD (04/06/2016).  Patient not eating.  No nausea or vomiting.    Objective:    BP (!) 77/62    Pulse 106    Temp 97.6 ??F (36.4 ??C) (Axillary)    Resp 28    Ht 5\' 5"  (1.651 m)    Wt 175 lb 7.8 oz (79.6 kg)    SpO2 97%    BMI 29.20 kg/m??     Heart:  RRR, no murmurs, gallops, or rubs.  Lungs:  CTA bilaterally, no wheeze, rales or rhonchi  Abd: bowel sounds present, nontender, nondistended, no masses  Extrem:  No clubbing, cyanosis, or edema    CBC with Differential:    Lab Results   Component Value Date    WBC 7.8 04/12/2016    RBC 3.44 04/12/2016    HGB 10.3 04/12/2016    HCT 31.2 04/12/2016    PLT 94 04/12/2016    MCV 90.7 04/12/2016    MCH 29.9 04/12/2016    MCHC 33.0 04/12/2016    RDW 17.2 04/12/2016    LYMPHOPCT 2.6 04/12/2016    MONOPCT 9.6 04/12/2016    BASOPCT 0.1 04/12/2016    MONOSABS 0.78 04/12/2016    LYMPHSABS 0.23 04/12/2016    EOSABS 0.00 04/12/2016    BASOSABS 0.00 04/12/2016     CMP:    Lab Results   Component Value Date    NA 132 04/12/2016    K 3.4 04/12/2016    CL 102 04/12/2016    CO2 18 04/12/2016    BUN 6 04/12/2016    CREATININE 0.6 04/12/2016    GFRAA >60 04/12/2016    LABGLOM >60 04/12/2016    GLUCOSE 98 04/12/2016    PROT 5.0 04/11/2016    LABALBU 2.0 04/11/2016    CALCIUM 7.3 04/12/2016    BILITOT 2.8 04/11/2016    ALKPHOS 252 04/11/2016    AST 66 04/11/2016    ALT 36 04/11/2016     BMP:    Lab Results   Component Value Date    NA 132 04/12/2016    K 3.4 04/12/2016    CL 102 04/12/2016    CO2 18 04/12/2016    BUN 6 04/12/2016    LABALBU 2.0 04/11/2016    CREATININE 0.6 04/12/2016    CALCIUM 7.3 04/12/2016    GFRAA >60 04/12/2016    LABGLOM >60 04/12/2016    GLUCOSE 98 04/12/2016     Magnesium:    Lab Results   Component Value Date    MG 1.7 04/12/2016     Phosphorus:    Lab Results   Component Value Date    PHOS 3.2 04/12/2016     PT/INR:    Lab Results   Component Value Date    PROTIME 22.6 04/03/2016    INR  2.0 04/03/2016     PTT:    Lab Results   Component Value Date    APTT 55.8 04/06/2016   [APTT}     Assessment:    Patient Active Problem List   Diagnosis   ??? Moderate protein-calorie malnutrition (HCC)   ??? V-tach [facility](HCC)   ??? COPD (chronic obstructive pulmonary disease) (HCC)   ??? NSTEMI (non-ST elevated myocardial infarction) (HCC)   ??? CAD (coronary artery disease), native coronary artery   ??? Hyperlipidemia LDL goal <100   ??? PUD (peptic ulcer disease)   ??? Dementia   ??? Acquired hypothyroidism   ???  Hypokalemia   ??? Persistent atrial fibrillation (HCC)   ??? Esophageal ring   ??? Sepsis (HCC)   ??? UTI (urinary tract infection)   ??? Cellulitis       Plan:  Supplement diet.  EP on board.  Agree it is related to cardiac ischemia.  Agree AICD is not appropriate treatment.  Discussed with EP.  Continue breathing treatments and supplemental oxygen.  Cardiology on board.  Early conservative therapy chosen. Discussed at length with cardiology.  Agree interventional route would cause more harm than good for patient.  She does not have mental capacity to understand procedure or what is required after procedure.  Continue medical management of ascad.  Continue aggressive lipid therapy.  PPI therapy.  Patient very confused.    Continue synthroid.  Supplemented.  Rate controlled.  Elevated HR.  Cardiology and EP on board.  GI evaluation appreciated.  Status post EGD.   Secondary to UTI and cellulitis.  Antibiotics.  As above.   As above.  Patient continues to decline.  Poor outcome anticipated.  Palliative care on board.  Probably needs hospice.       Minda Dittoobert J Clemens Lachman  MD  11:12 AM  04/12/2016

## 2016-04-12 NOTE — Consults (Signed)
GENERAL SURGERY  CONSULT NOTE  04/12/2016    Physician Consulted: Dr. Alison Murrayansom  Reason for Consult: concern for abdominal ischemia  Referring Physician: Dr. Maple HudsonMoser    HPI  [name] is a 82 y.o. female with a history of PAF, COPD, who presents for evaluation of ischemic bowel during an admission for AMS complicated by several cardiac arrests with spontaneous recovery.  She is unable to give a history and nurse at bedside reports that family is considering hospice and she currently has a limited code status dictating no intubation or chest compressions      Past Medical History:   Diagnosis Date   ??? A-fib (HCC)    ??? Anticoagulated     on pradaxa   ??? Asthma    ??? COPD (chronic obstructive pulmonary disease) (HCC)    ??? Depression    ??? Forgetfulness     capable of signing own consents   ??? GERD (gastroesophageal reflux disease)    ??? HOH (hard of hearing)     no aides   ??? Hyperlipidemia    ??? Pain     bilateral upper quadrant /  for EGD   ??? Restless leg syndrome    ??? Thyroid disease        Past Surgical History:   Procedure Laterality Date   ??? ABDOMEN SURGERY  02/19/2016    laprascopic with graham patch of gastric ulcer   ??? BACK SURGERY  2010?    lumbar    ??? BREAST SURGERY      Lumpectomy   ??? ENDOSCOPY, COLON, DIAGNOSTIC  02/28/2015   ??? HYSTERECTOMY  1970's   ??? JOINT REPLACEMENT Bilateral 2011?    knee   ??? VARICOSE VEIN SURGERY Bilateral 2012       Medications Prior to Admission:    Prior to Admission medications    Medication Sig Start Date End Date Taking? Authorizing Provider   ondansetron (ZOFRAN) 4 MG tablet Take 1 tablet by mouth every 8 hours as needed for Nausea or Vomiting 02/26/16  Yes Willadean CarolJoseph Yurich, MD   omeprazole (PRILOSEC) 40 MG delayed release capsule Take 1 capsule by mouth daily 02/28/15  Yes Erling ConteJoseph A Ambrose, MD   MULTIPLE VITAMIN PO Take by mouth daily   Yes Historical Provider, MD   buPROPion [facility](WELLBUTRIN SR) 150 MG extended release tablet Take 150 mg by mouth nightly   Yes Historical Provider, MD    donepezil (ARICEPT) 10 MG tablet Take 10 mg by mouth nightly   Yes Historical Provider, MD   simvastatin (ZOCOR) 20 MG tablet Take 20 mg by mouth nightly.     Yes Historical Provider, MD   levothyroxine (SYNTHROID) 50 MCG tablet Take 50 mcg by mouth daily.     Yes Historical Provider, MD   citalopram (CELEXA) 20 MG tablet Take 20 mg by mouth daily Instructed to take with sip water am of procedure   Yes Historical Provider, MD   montelukast (SINGULAIR) 10 MG tablet Take 10 mg by mouth nightly.     Yes Historical Provider, MD   raloxifene (EVISTA) 60 MG tablet Take 60 mg by mouth daily.     Yes Historical Provider, MD   pregabalin (LYRICA) 150 MG capsule Take 150 mg by mouth 2 times daily Instructed to take with sip water am of procedure   Yes Historical Provider, MD   digoxin (LANOXIN) 0.125 MG tablet Take 125 mcg by mouth every 48 hours Takes at night every other day  Yes Historical Provider, MD   dabigatran (PRADAXA) 150 MG capsule Take 150 mg by mouth 2 times daily.     Yes Historical Provider, MD   albuterol sulfate HFA 108 (90 BASE) MCG/ACT inhaler Inhale 2 puffs into the lungs every 6 hours as needed for Wheezing (last took this am)    Historical Provider, MD   omeprazole (PRILOSEC) 20 MG delayed release capsule Take 2 capsules by mouth 2 times daily for 14 days 01/19/15 02/02/15  Delories Heinzaryl Donald, MD   ipratropium-albuterol (DUONEB) 0.5-2.5 (3) MG/3ML SOLN nebulizer solution Inhale 1 vial into the lungs every 4 hours Uses prn / instructed if needs to use dos    Historical Provider, MD       Allergies   Allergen Reactions   ??? Aspirin      Reaction unknown   ??? Chicken Allergy      Allergic to poultry (eggs, chicken, [place])   ??? Eggs Or Egg-Derived Products Nausea And Vomiting   ??? Sulfa Antibiotics Nausea And Vomiting       History reviewed. No pertinent family history.    Social History   Substance Use Topics   ??? Smoking status: Never Smoker   ??? Smokeless tobacco: Never Used   ??? Alcohol use No         Review of  Systems unable to obtain      PHYSICAL EXAM:    Vitals:    04/12/16 2300   BP: 103/65   Pulse: 108   Resp: 26   Temp:    SpO2: 96%       General Appearance:  Opens eyes and groans with painful stimulation, mouth breathing, NGT in place  Skin: dry, warm  Head/face:  Temporal wasting  Eyes:  No gross abnormalities.  Lungs:  Mild increased work of breathing  Heart:  Extremities warm, well perfused  Abdomen:  Soft, non distended, severe tenderness (disproportionate to exam  Extremities: Extremities warm to touch, pink, with no edema.    LABS:    CBC  Recent Labs      04/12/16   0300   WBC  7.8   HGB  10.3*   HCT  31.2*   PLT  94*     BMP  Recent Labs      04/12/16   0300   NA  132   K  3.4*   CL  102   CO2  18*   BUN  6*   CREATININE  0.6   CALCIUM  7.3*     Liver Function  Recent Labs      04/12/16   1755   BILITOT  2.7*   BILIDIR  1.6*   AST  57*   ALT  33*   ALKPHOS  167*   PROT  4.0*   LABALBU  1.7*       RADIOLOGY    Ct Head Wo Contrast    Result Date: 04/03/2016  Patient MRN:  [id number] DOB: 12/09/1927 Age: 89 years Gender: Female Order Date:  04/03/2016 12:05 PM EXAM: CT HEAD WO CONTRAST NUMBER OF IMAGES:  105 INDICATION:  ams COMPARISON: None RESULT: Post-operative change:  None. Acute change:   No evidence of an acute intracranial process.   Hemorrhage:    No evidence of acute intracranial hemorrhage. Mass effect / Mass lesion:   There is no evidence of an intracranial mass or extraaxial fluid collection.  No significant mass effect.  Chronic change:   Scattered patchy foci of  low attenuation are present within supratentorial white matter which is a nonspecific finding but likely represents mild microvascular ischemia. Ventricles:   Ventricular enlargement concordant with the degree of parenchymal volume loss. Paranasal sinuses and skull base:  The visualized paranasal sinuses are clear.  The skull base and imaged soft tissues are unremarkable.     No acute intracranial findings. Chronic microvascular ischemic  changes and parenchymal volume loss.     Xr Chest Portable    Result Date: 04/03/2016  Patient MRN:  [id number] DOB: 1928-02-26 Age: 22 years Gender: Female Order Date:  04/03/2016 9:37 AM EXAM: XR CHEST PORTABLE NUMBER OF IMAGES:  1 AP Portable INDICATION:  cough  COMPARISON: February 19, 2016 FINDINGS: Hazy airspace opacities likely represent mild pulmonary edema, increased since prior exam. Small right pleural effusion. No pneumothorax. No focal consolidation. Stable cardiomediastinal silhouette. Atherosclerotic calcifications aortic arch. Stable bones.     Cardiomegaly with pulmonary edema and a small right pleural effusion.        ASSESSMENT:  82 y.o. female with poor prognosis, probable bowel ischemia given recent history    PLAN:  -no plans for surgical intervention given poor prognosis and family's wishes    Please call for further surgical questions or concerns    Discussed with Dr. Alison Murray    Electronically signed by Royal Piedra, MD on 04/12/16 at 11:34 PM

## 2016-04-12 NOTE — Plan of Care (Signed)
Patient's family (daughter and granddaughter) updated bedside regarding current assessment, management, and plans.     They are considering hospice care at this time. Code status clarified: in the event of decompensation, family does not want intubation or chest compressions.       Electronically signed by Alma DownsJoanne G Santos, MD on 04/12/2016 at 7:46 PM

## 2016-04-13 ENCOUNTER — Encounter: Attending: Cardiovascular Disease | Primary: Family Medicine

## 2016-04-13 ENCOUNTER — Inpatient Hospital Stay: Admit: 2016-04-13 | Primary: Family Medicine

## 2016-04-13 LAB — CBC WITH AUTO DIFFERENTIAL
Basophils %: 0.1 % (ref 0.0–2.0)
Basophils Absolute: 0.03 E9/L (ref 0.00–0.20)
Eosinophils %: 0 % (ref 0.0–6.0)
Eosinophils Absolute: 0 E9/L — ABNORMAL LOW (ref 0.05–0.50)
Hematocrit: 29.6 % — ABNORMAL LOW (ref 34.0–48.0)
Hemoglobin: 9.8 g/dL — ABNORMAL LOW (ref 11.5–15.5)
Immature Granulocytes #: 0.42 E9/L
Immature Granulocytes %: 1.8 % (ref 0.0–5.0)
Lymphocytes %: 8.7 % — ABNORMAL LOW (ref 20.0–42.0)
Lymphocytes Absolute: 2.05 E9/L (ref 1.50–4.00)
MCH: 30 pg (ref 26.0–35.0)
MCHC: 33.1 % (ref 32.0–34.5)
MCV: 90.5 fL (ref 80.0–99.9)
MPV: 12.4 fL — ABNORMAL HIGH (ref 7.0–12.0)
Monocytes %: 4.2 % (ref 2.0–12.0)
Monocytes Absolute: 0.98 E9/L — ABNORMAL HIGH (ref 0.10–0.95)
Neutrophils %: 85.2 % — ABNORMAL HIGH (ref 43.0–80.0)
Neutrophils Absolute: 20.07 E9/L — ABNORMAL HIGH (ref 1.80–7.30)
Platelets: 87 E9/L — ABNORMAL LOW (ref 130–450)
RBC: 3.27 E12/L — ABNORMAL LOW (ref 3.50–5.50)
RDW: 17.7 fL — ABNORMAL HIGH (ref 11.5–15.0)
WBC: 23.6 E9/L — ABNORMAL HIGH (ref 4.5–11.5)

## 2016-04-13 LAB — PROTIME-INR
INR: 2.8
Protime: 32 s — ABNORMAL HIGH (ref 9.3–12.4)

## 2016-04-13 LAB — PLATELET CONFIRMATION

## 2016-04-13 LAB — EKG 12-LEAD
Atrial Rate: 53 {beats}/min
Q-T Interval: 510 ms
QRS Duration: 72 ms
QTc Calculation (Bazett): 592 ms
R Axis: -6 degrees
T Axis: 15 degrees
Ventricular Rate: 81 {beats}/min

## 2016-04-13 LAB — COMPREHENSIVE METABOLIC PANEL
ALT: 33 U/L — ABNORMAL HIGH (ref 0–32)
AST: 62 U/L — ABNORMAL HIGH (ref 0–31)
Albumin: 1.6 g/dL — ABNORMAL LOW (ref 3.5–5.2)
Alkaline Phosphatase: 176 U/L — ABNORMAL HIGH (ref 35–104)
Anion Gap: 16 mmol/L (ref 7–16)
BUN: 8 mg/dL (ref 8–23)
CO2: 17 mmol/L — ABNORMAL LOW (ref 22–29)
Calcium: 7.5 mg/dL — ABNORMAL LOW (ref 8.6–10.2)
Chloride: 104 mmol/L (ref 98–107)
Creatinine: 0.7 mg/dL (ref 0.5–1.0)
GFR African American: >60
GFR Non-African American: >60 mL/min/{1.73_m2} (ref >=60)
Glucose: 40 mg/dL — ABNORMAL LOW (ref 74–109)
Potassium: 3.9 mmol/L (ref 3.5–5.0)
Sodium: 137 mmol/L (ref 132–146)
Total Bilirubin: 3.1 mg/dL — ABNORMAL HIGH (ref 0.0–1.2)
Total Protein: 3.9 g/dL — ABNORMAL LOW (ref 6.4–8.3)

## 2016-04-13 LAB — POCT GLUCOSE: Meter Glucose: 72 mg/dL (ref 70–110)

## 2016-04-13 LAB — HEPATIC FUNCTION PANEL
ALT: 33 U/L — ABNORMAL HIGH (ref 0–32)
AST: 57 U/L — ABNORMAL HIGH (ref 0–31)
Albumin: 1.7 g/dL — ABNORMAL LOW (ref 3.5–5.2)
Alkaline Phosphatase: 167 U/L — ABNORMAL HIGH (ref 35–104)
Bilirubin, Direct: 1.6 mg/dL — ABNORMAL HIGH (ref 0.0–0.3)
Bilirubin, Indirect: 1.1 mg/dL — ABNORMAL HIGH (ref 0.0–1.0)
Total Bilirubin: 2.7 mg/dL — ABNORMAL HIGH (ref 0.0–1.2)
Total Protein: 4 g/dL — ABNORMAL LOW (ref 6.4–8.3)

## 2016-04-13 LAB — MAGNESIUM: Magnesium: 2.1 mg/dL (ref 1.6–2.6)

## 2016-04-13 LAB — PHOSPHORUS: Phosphorus: 3.5 mg/dL (ref 2.5–4.5)

## 2016-04-13 LAB — LACTIC ACID
Lactic Acid: 4.4 mmol/L (ref 0.5–2.2)
Lactic Acid: 5 mmol/L (ref 0.5–2.2)
Lactic Acid: 4.4 mmol/L (ref 0.5–2.2)

## 2016-04-13 MED ORDER — FUROSEMIDE 10 MG/ML IJ SOLN
10 MG/ML | Freq: Two times a day (BID) | INTRAMUSCULAR | Status: AC
Start: 2016-04-13 — End: 2016-04-13
  Administered 2016-04-13 (×2): 40 mg via INTRAVENOUS

## 2016-04-13 MED ORDER — DEXTROSE 50 % IV SOLN
50 % | INTRAVENOUS | Status: DC | PRN
Start: 2016-04-13 — End: 2016-04-17
  Administered 2016-04-13: 12:00:00 12.5 g via INTRAVENOUS

## 2016-04-13 MED ORDER — GLUCAGON HCL RDNA (DIAGNOSTIC) 1 MG IJ SOLR
1 MG | INTRAMUSCULAR | Status: DC | PRN
Start: 2016-04-13 — End: 2016-04-17

## 2016-04-13 MED ORDER — DEXTROSE 5 % IV SOLN
5 % | INTRAVENOUS | Status: DC | PRN
Start: 2016-04-13 — End: 2016-04-17

## 2016-04-13 MED ORDER — ALBUMIN HUMAN 25 % IV SOLN
25 % | Freq: Three times a day (TID) | INTRAVENOUS | Status: AC
Start: 2016-04-13 — End: 2016-04-15
  Administered 2016-04-13 – 2016-04-15 (×6): 25 g via INTRAVENOUS

## 2016-04-13 MED ORDER — MORPHINE SULFATE 2 MG/ML IJ SOLN
2 MG/ML | INTRAMUSCULAR | Status: DC | PRN
Start: 2016-04-13 — End: 2016-04-14
  Administered 2016-04-13 – 2016-04-14 (×3): 1 mg via INTRAVENOUS

## 2016-04-13 MED ORDER — PIPERACILLIN-TAZOBACTAM IN DEX 3-0.375 GM/50ML IV SOLN
Freq: Three times a day (TID) | INTRAVENOUS | Status: DC
Start: 2016-04-13 — End: 2016-04-14
  Administered 2016-04-13 – 2016-04-14 (×4): 3.375 g via INTRAVENOUS

## 2016-04-13 MED ORDER — GLUCOSE 40 % PO GEL
40 % | ORAL | Status: DC | PRN
Start: 2016-04-13 — End: 2016-04-17

## 2016-04-13 MED FILL — FUROSEMIDE 10 MG/ML IJ SOLN: 10 MG/ML | INTRAMUSCULAR | Qty: 4

## 2016-04-13 MED FILL — MORPHINE SULFATE 2 MG/ML IJ SOLN: 2 mg/mL | INTRAMUSCULAR | Qty: 1

## 2016-04-13 MED FILL — ZOSYN 3-0.375 GM/50ML IV SOLN: INTRAVENOUS | Qty: 50

## 2016-04-13 MED FILL — FAMOTIDINE 20 MG/2ML IV SOLN: 20 MG/2ML | INTRAVENOUS | Qty: 2

## 2016-04-13 MED FILL — ALBUMINAR-25 25 % IV SOLN: 25 % | INTRAVENOUS | Qty: 100

## 2016-04-13 MED FILL — LOVENOX 80 MG/0.8ML SC SOLN: 80 MG/0.8ML | SUBCUTANEOUS | Qty: 0.8

## 2016-04-13 MED FILL — THIAMINE HCL 100 MG/ML IJ SOLN: 100 MG/ML | INTRAMUSCULAR | Qty: 1

## 2016-04-13 MED FILL — NORMAL SALINE FLUSH 0.9 % IV SOLN: 0.9 % | INTRAVENOUS | Qty: 40

## 2016-04-13 MED FILL — VANCOMYCIN HCL 1000 MG IV SOLR: 1000 MG | INTRAVENOUS | Qty: 1250

## 2016-04-13 NOTE — Plan of Care (Signed)
Updated the patient's son Audery AmelRonnie Daudelin ([phone]) with the patients current status, her poor prognosis and the meeting with hospice this evening.     Electronically signed by Andi DevonGulshan Man Cathlean MarseillesSingh Leonia Heatherly, MD on 04/13/2016 at 12:02 PM

## 2016-04-13 NOTE — Plan of Care (Signed)
Problem: Restraint Use - Nonviolent/Non-Self-Destructive Behavior:  Goal: Absence of restraint indications  Absence of restraint indications   Outcome: Not Met This Shift    Goal: Absence of restraint-related injury  Absence of restraint-related injury   Outcome: Met This Shift

## 2016-04-13 NOTE — Plan of Care (Signed)
Problem: Nutrition  Goal: Optimal nutrition therapy  Outcome: Ongoing  Nutrition Problem: Moderate malnutrition, in context of acute illness or injury  Intervention: See RD progress Note   Nutritional Goals: Tolerate trickle feed

## 2016-04-13 NOTE — Progress Notes (Signed)
[facility]  Department of Internal Medicine   MICU Progress Note    Patient:  Michelle FillerRuth E Gilmartin 82 y.o. female   MRN: [id number]       Date of Service: 04/13/2016    Allergy: Aspirin; Chicken allergy; Eggs or egg-derived products; and Sulfa antibiotics    Subjective   Patient was seen and examined at the bedside. She looks more obtunded today.  Responsive to painful stimuli.      Interval Hx:   - Family changed the code status to limited. Considering hospice care.   - Core track placed. Trickle feeds with dietitian consult.   - General surgery consulted for possible surgical intervention for lactic acidosis in the setting of portal vein thrombosis. Not a good candidate.   - Volume overload continues after one dose of Lasix 40 mg injection overnight. Ordered one more dose of lasix injection with Albumin.   - Morphine administered for pain.     Objective     VS:   Vitals:    04/13/16 0000   BP: (!) 100/53   Pulse: 106   Resp: 21   Temp: 94.1 ??F (34.5 ??C)   SpO2:       I & O - 24hr:    Intake/Output Summary (Last 24 hours) at 04/13/16 0209  Last data filed at 04/12/16 1900   Gross per 24 hour   Intake             3089 ml   Output              638 ml   Net             2451 ml     I/O last 3 completed shifts:  In: 3089 [I.V.:3089]  Out: 695 [Urine:520; Stool:175] No intake/output data recorded.   Weight change:     Physical Exam:  ?? Vitals: BP (!) 100/53    Pulse 106    Temp 94.1 ??F (34.5 ??C) (Axillary)    Resp 21    Ht 5\' 5"  (1.651 m)    Wt 175 lb 7.8 oz (79.6 kg)    SpO2 96%    BMI 29.20 kg/m??     ?? I & O - 24hr: No intake/output data recorded.   ?? General Appearance:??mild distress, moderate distress and uncooperative  ?? HEENT: ??Head: Normocephalic, no lesions, without obvious abnormality.  ?? Neck:??supple, symmetrical, trachea midline  ?? Lung:??clear to auscultation bilaterally  ?? Heart:??irregularly irregular rhythm  ?? Abdomen: soft, tender; bowel sounds normal; no masses, ??no organomegaly  ?? Extremities: ??right  inner thigh eythematous rash extending on the medial surface towards the knee  ?? Musculokeletal:??No joint swelling, no muscle tenderness. ROM normal in all joints of extremities.   ?? Neurologic: Mental status: lethargic, responsive to pain  ??    Medications     Continuous Infusions:   Scheduled Meds:  ??? vancomycin  15 mg/kg Intravenous Q24H   ??? LORazepam  1 mg Intravenous Once   ??? piperacillin-tazobactam  3.375 g Intravenous Q8H   ??? sodium chloride flush  10 mL Intravenous 2 times per day   ??? famotidine (PEPCID) injection  20 mg Intravenous BID   ??? thiamine  100 mg Intravenous Daily   ??? enoxaparin  1 mg/kg Subcutaneous Q12H   ??? miconazole nitrate   Topical BID   ??? sodium chloride (PF)  10 mL Intravenous Daily   ??? sodium chloride flush  10 mL Intravenous 2 times per day  PRN Meds: sodium chloride flush, acetaminophen, magnesium hydroxide, miconazole nitrate **AND** miconazole nitrate, mineral oil-hydrophilic petrolatum, sodium chloride flush, trimethobenzamide, ipratropium-albuterol, acetaminophen  Nutrition:   NPO     Labs and Imaging Studies     CBC:   Recent Labs      04/11/16   0540  04/12/16   0300   WBC  8.1  7.8   HGB  12.4  10.3*   HCT  37.0  31.2*   MCV  90.0  90.7   PLT  140  94*       BMP:    Recent Labs      04/11/16   0540  04/11/16   1807  04/12/16   0300   NA  132  132  132   K  4.5  4.0  3.4*   CL  100  101  102   CO2  21*  19*  18*   BUN  4*  5*  6*   CREATININE  0.6  0.6  0.6   GLUCOSE  105  110*  98       LIVER PROFILE:   Recent Labs      04/11/16   0540  04/12/16   1755   AST  66*  57*   ALT  36*  33*   BILIDIR   --   1.6*   BILITOT  2.8*  2.7*   ALKPHOS  252*  167*       PT/INR:   No results for input(s): PROTIME, INR in the last 72 hours.    APTT:   No results for input(s): APTT in the last 72 hours.    Fasting Lipid Panel:    No results found for: CHOL, TRIG, HDL    Cardiac Enzymes:    Lab Results   Component Value Date    CKTOTAL 41 04/11/2016    CKMB 2.6 04/11/2016    TROPONINI 0.04 (H)  04/03/2016    TROPONINI 0.03 04/03/2016    TROPONINI <0.01 02/19/2016       Notable Cultures:      ??   Culture  Date sent  Result    resp panel 12/17 neg    Sputum 12/17 ??    Body Fluid ?? ??    Urine Strep pneumonia Ag 12/17       Urine Legionella Ag   12/17 ??    Blood  12/17      Urine  12/17  growth present for GNR   ??  ??   Antibiotic  Days  Day started   Vanc ?? 12/17     Zosyn      12/17              ??  Oxygen:   Nasal cannula L/min  ??   Face mask %  ??   Reservoirs mask %  ??   ??  ??  ????Lines: ??Site ??Day ??Date inserted   ????TLC ????R IJ ?? 12/17????   ????PICC ?? ?? ??   ????Arterial line ?? ?? ??   ????Peripheral line ?? ?? ??   ?? ?? ?? ??   ??  ????DVT Prophylaxis ??   ????Heparin  ????x   ????Enoxaparin ??   ????PCDs ??   ??  Imaging studies:  ??Imaging ??Date ??Result   ??CXR ??12/17 ??b/l effusions + edema   ?? ?? ??   ?? ?? ??   ??  EKG: Rhythm Strip: afib, rate controlled  ??  Resident's Assessment and Plan     Neurology  Acute on chronic metabolic encephalopathy likely 2/2 possible sepsis in the setting of dementia  Possible Delirium  - Usually alert, oriented to self. Overnight agitated; ativan administered, less alert since then. Only responsive to painful stimuli.   - Daily neuro assessment  - Consider imaging studies if no improvement  ??- Morphine administered for pain.     Cardiovascular  AfibRVR, rate controlled, back to afib  NSTEMI  Possible volume overload  - On Dabigatran, digoxin at home. Dabigatran on hold in hospital. Has been on heparin drip at some point. Consider anticoagulation.   - AFRVR noted during RRT  - TTE showed good biventricular function  - Heart cath could not be done due to patient's dysphagia; which avoids her to take po meds needed after heart cath.   - Not a good candidate for ICD-discussion noted; not a good candidate per EP since the life expectancy less than 1 year  - Started on Cardizem drip without bolus; continue hemodynamic monitoring- discontinued later  - Lasix administered overnight for volume overload. Will  administer another dose with albumin for resistant volume overload.   ??  Pulmonary  Volume overload  - B/l pleural effusions on the CXR  - ABG not remarkable for respiratory distress  - Lasix with albumin  ??  Renal  Lactic acidosis  - likely 2/2 sepsis vs intestinal infarct in the setting of portal vein thrombosis  - IVF stopped in the setting of volume overload  - LA q6h  ??  Infectious Disease  Possible sepsis likely 2/2 possible UTI vs possible cellulitis  Possible Sepsis  - qSOFA 2 with AMS  - Multiple possible infectious sources;   ????????????????????????????????????????????????- stool loose-Cdiff negative, discontinue isolation.   ????????????????????????????????????????????????- Urine culture positive for GNR  ????????????????????????????????????????????????- New developed rash- possible cellulitis- on vanco  ????????????????????????????????????????????????- CXR increased opacities, possible HCAP vs volume overload  - Procalcitonin 0.26  - Lactic acid around 4-5 in the setting of possible intestinal infarct with portal vein thrombosis  - IVF on hold due to volume overload  - Vanc and Zosyn.   ??  Gastroenterology  Dysphagia  - Not clear etiology  - GI following  - Core track placed, Trickle feed with diet consult  - Denies offered food  - EGD showed 3 duodenal ulcers; on PPI  ??  Cirrhosis with unclear etio  - GI is following  ??  Portal vein thrombosis  - Lovenox therapeutic dose  - CT abdomen w contrast  ??  DVT/GI prophylaxis  Pepcid&Heparin  ??  ??      Baldomero LamyMustafa Tunc, M.D., PGY-1   Internal Medicine    Intensivist: Dr. Cheryll CockayneApostolis    I personally saw, examined and provided care for the patient. Radiographs, labs and medication list were reviewed by me independently. I spoke with bedside nursing, therapists and consultants. Critical care services and times documented are independent of procedures and multidisciplinary rounds with Residents. Additionally comprehensive, multidisciplinary rounds were conducted with the MICU team. The case was discussed in detail and plans for care were established. Review of  Residents documentation was conducted and revisions were made as appropriate. I agree with the above documented exam, problem list and plan of care.    Poor prognosis   Hospice consult   Jules SchickMichael Drevin Ortner, M.D.   Pulmonary/Critical Care Medicine   38 min cct excluding procedures

## 2016-04-13 NOTE — Other (Signed)
Patient pulls at lines and tubes when released.  Cannot be redirected at this time.  2 point soft restraints continued for patient safety.  Will continue to monitor

## 2016-04-13 NOTE — Care Coordination-Inpatient (Signed)
Social Work/Discharge Planning:    Noted that Hospice consulted and, per Palliative Care, family has chosen [facility]. Referral made to Richard with [facility] intake. Liaison Melissa also aware.    Electronically signed by Marinda ElkGarrett Lucine Bilski, LSW on 04/13/2016 at 11:05 AM

## 2016-04-13 NOTE — Other (Signed)
Attempting to remove medical devices, unable to follow commands at this time.

## 2016-04-13 NOTE — Progress Notes (Signed)
Subjective:    Events of evening reviewed.  Patient very confused.  Status post EGD (04/06/2016).  Patient not eating.  No nausea or vomiting.    Objective:    BP 90/71    Pulse 113    Temp 98.7 ??F (37.1 ??C) (Axillary)    Resp 21    Ht 5\' 5"  (1.651 m)    Wt 173 lb 15.1 oz (78.9 kg)    SpO2 100%    BMI 28.95 kg/m??     Heart:  RRR, no murmurs, gallops, or rubs.  Lungs:  CTA bilaterally, no wheeze, rales or rhonchi  Abd: bowel sounds present, nontender, nondistended, no masses  Extrem:  No clubbing, cyanosis, or edema    CBC with Differential:    Lab Results   Component Value Date    WBC 23.6 04/13/2016    RBC 3.27 04/13/2016    HGB 9.8 04/13/2016    HCT 29.6 04/13/2016    PLT 87 04/13/2016    MCV 90.5 04/13/2016    MCH 30.0 04/13/2016    MCHC 33.1 04/13/2016    RDW 17.7 04/13/2016    LYMPHOPCT 2.6 04/12/2016    MONOPCT 9.6 04/12/2016    BASOPCT 0.1 04/12/2016    MONOSABS 0.78 04/12/2016    LYMPHSABS 0.23 04/12/2016    EOSABS 0.00 04/12/2016    BASOSABS 0.00 04/12/2016     CMP:    Lab Results   Component Value Date    NA 137 04/13/2016    K 3.9 04/13/2016    CL 104 04/13/2016    CO2 17 04/13/2016    BUN 8 04/13/2016    CREATININE 0.7 04/13/2016    GFRAA >60 04/13/2016    LABGLOM >60 04/13/2016    GLUCOSE 40 04/13/2016    PROT 3.9 04/13/2016    LABALBU 1.6 04/13/2016    CALCIUM 7.5 04/13/2016    BILITOT 3.1 04/13/2016    ALKPHOS 176 04/13/2016    AST 62 04/13/2016    ALT 33 04/13/2016     BMP:    Lab Results   Component Value Date    NA 137 04/13/2016    K 3.9 04/13/2016    CL 104 04/13/2016    CO2 17 04/13/2016    BUN 8 04/13/2016    LABALBU 1.6 04/13/2016    CREATININE 0.7 04/13/2016    CALCIUM 7.5 04/13/2016    GFRAA >60 04/13/2016    LABGLOM >60 04/13/2016    GLUCOSE 40 04/13/2016     Magnesium:    Lab Results   Component Value Date    MG 2.1 04/13/2016     Phosphorus:    Lab Results   Component Value Date    PHOS 3.5 04/13/2016     PT/INR:    Lab Results   Component Value Date    PROTIME 32.0 04/13/2016    INR 2.8  04/13/2016     PTT:    Lab Results   Component Value Date    APTT 55.8 04/06/2016   [APTT}     Assessment:    Patient Active Problem List   Diagnosis   ??? Moderate protein-calorie malnutrition (HCC)   ??? V-tach [facility](HCC)   ??? COPD (chronic obstructive pulmonary disease) (HCC)   ??? NSTEMI (non-ST elevated myocardial infarction) (HCC)   ??? CAD (coronary artery disease), native coronary artery   ??? Hyperlipidemia LDL goal <100   ??? PUD (peptic ulcer disease)   ??? Dementia   ??? Acquired hypothyroidism   ???  Hypokalemia   ??? Persistent atrial fibrillation (HCC)   ??? Esophageal ring   ??? Sepsis (HCC)   ??? UTI (urinary tract infection)   ??? Cellulitis   ??? Generalized abdominal pain       Plan:  Supplement diet.  EP on board.  Agree it is related to cardiac ischemia.  Agree AICD is not appropriate treatment.  Discussed with EP.  Continue breathing treatments and supplemental oxygen.  Cardiology on board.  Early conservative therapy chosen. Discussed at length with cardiology.  Agree interventional route would cause more harm than good for patient.  She does not have mental capacity to understand procedure or what is required after procedure.  Continue medical management of ascad.  Continue aggressive lipid therapy.  PPI therapy.  Patient very confused.    Continue synthroid.  Supplemented.  Rate controlled.  Elevated HR.  Cardiology and EP on board.  GI evaluation appreciated.  Status post EGD.   Secondary to UTI and cellulitis.  Antibiotics.  As above.   As above.  General surgery consulted.  Bowel ischemia suspected.    Patient continues to decline.  Poor outcome anticipated.  Palliative care on board.  Probably needs hospice.       Minda Dittoobert J Nomi Rudnicki  MD  9:26 AM  04/13/2016

## 2016-04-13 NOTE — Progress Notes (Signed)
Nutrition Assessment    Type and Reason for Visit: Reassess, Consult    Nutrition Recommendations: Plans to start trickle feed via [place]:     Rec Semi-Elemental formula for optiaml GI tolerance @ 10 ml/hr.   Will provide 240 ml tv, 288 kcals, 18 gm pro, 195 ml free water  Please consult if/when goal rate needed.  Will continue to monitor.     Malnutrition Assessment:  ?? Malnutrition Status: Meets the criteria for moderate malnutrition  ?? Context: Acute illness or injury  ?? Findings of the 6 clinical characteristics of malnutrition (Minimum of 2 out of 6 clinical characteristics is required to make the diagnosis of moderate or severe Protein Calorie Malnutrition based on AND/ASPEN Guidelines):  1. Energy Intake-Less than or equal to 50%, greater than 7 days    2. Weight Loss-Unable to assess (d/t fluid)   3. Fat Loss-Mild subcutaneous fat loss, Orbital  4. Muscle Loss-Mild muscle mass loss, Clavicles (pectoralis and deltoids), Temples (temporalis muscle)  5. Fluid Accumulation-Mild fluid accumulation, Extremities  6. Grip Strength-Not measured    Nutrition Diagnosis:   ?? Problem: Moderate malnutrition, in context of acute illness or injury  ?? Etiology: related to Cognitive or neurological impairment    ?? Signs and symptoms:  as evidenced by NPO status due to medical condition, Diet history of poor intake mild fat & muscle loss    Nutrition Assessment:  ?? Subjective Assessment: s/p RRT  ?? Nutrition-Focused Physical Findings: pt lethargic, disorientation, intermittent hypotension, volume overload, +I/O's 10L, +2 pitting BLE edema, diarrhea w/ FMS, distant BS, dysphagia      ?? Wound Type: Multiple, Skin Tears     ?? Current Nutrition Therapies:  ?? Oral Diet Orders: NPO   ?? Oral Diet intake:  (previously refusing to eat)     ?? Anthropometric Measures:  ?? Ht: 5\' 5"  (165.1 cm)   ?? Current Body Wt: 184 lb (83.5 kg) (12/19 bedscale - wt is elevated)  ?? Admission Body Wt: 157 lb (71.2 kg) (12/9 first measured)  ?? Usual  Body Wt: 156 lb (70.8 kg) (EMR actual 02/09/16)  ?? % Weight Change: CBW elevated w/ noted volume overload   ?? Ideal Body Wt: 125 lb (56.7 kg), % Ideal Body 126% (using adm wt d/t +fluids)  ?? BMI Classification: BMI 25.0 - 29.9 Overweight (using adm wt)     ?? Comparative Standards (Estimated Nutrition Needs):  ?? Estimated Daily Total Kcal: 1500-1600 (MSJ REE 1161 x 1.3 SF)  ?? Estimated Daily Protein (g): 95-110    Estimated Intake vs Estimated Needs: Intake Less Than Needs    Nutrition Risk Level: Moderate    Nutrition Interventions: Continued Inpatient Monitoring, Education not appropriate at this time, Coordination of Care (pt status d/w RN- possible hospice w/ family meeting planned)    Nutrition Evaluation:   ?? Evaluation: Goals set   ?? Goals: Tolerate trickle feed    ?? Monitoring: TF Intake, TF Tolerance, Gastric Residuals, Skin Integrity, Wound Healing, Fluid Balance, Mental Status/Confusion, Ascites/Edema, Weight, Comparative Standards, Pertinent Labs, Diarrhea    See Adult Nutrition Doc Flowsheet for more detail.     Electronically signed by Hillard Dankereann Ajdin Macke, RD, LD on 04/13/16 at 3:29 PM    Contact Number: Ext [phone]

## 2016-04-13 NOTE — Progress Notes (Signed)
Referral received. Chart reviewed. Update received from Diane, CNS from Palliative Medicine. Asked to call patient's daughter and set up a time to meet. Call placed to O[place] Fallsarol at [phone]. She will be working today until [employer]5PM and not arrive to the hospital until after 6PM. Okey Regalarol asked for the hopsice information to be provided to her daughter, Charlton AmorSherrie. Will follow up with patient's granddaughter.

## 2016-04-13 NOTE — Other (Signed)
Pt continues to pull at lines and medical equipment. Two point soft wrist restraints continued for pt safety.

## 2016-04-13 NOTE — Progress Notes (Signed)
Patient temp reading 94.1 axillary. Bair hugger applied

## 2016-04-13 NOTE — Progress Notes (Signed)
Palliative Medicine   Progress Note  Clinical Nurse Specialist      Hospital Day: 11    Recommendations:  1. Psychosocial support of patient and family: contacted daughter by phone; daughter [name] @ [phone]  2. Assist with goals of care: daughter in agreement with meet with hospice; prefers HOV; choiced; likely will take pt home; unable to be here until ~5 pm  3.  Hospice consult - choiced; wants HOV  3. Anticipatory Guidance:arrhythmia/post cardiac arrest; dementia, malnourishment; poor prognosis,   5. Code Status:DNR-CCA.   6. Symptom Management - no acute issues at this time    CC: AMS    HPI:  Michelle FillerRuth E [name] is a 82 y.o. female with significant past medical history of A-fib (on pradaxa; asthma, COPD,, GERD, depression and dementia, HLD who presented by EMS from home with altered mental status. ??Patient was found by family altered and barely responsive. ??She was noted to be in VT on monitoring; was defibrillated which persisted in the hospital and was defibrillated again. ??CPR was performed. ??CXR: notes cardiomegaly with pulmonary edema and a small right pleural effusion. CT of head shows no acute pathology but with chronic microvascular ischemic changes.  Cardiology and EP consulted. Pt was very confused but denied chest pain, shortness of breath, or other acute distress. [facility]   Pt was started on amiodarone; fluids, heparin and ASA but it's reported pt not taking orally. Pt underwent EGD on 12/12- noting 3 duodenal ulcers-recommendation for IV H2 blockers. Pt lives with daughter, [name] and has had HHC in the past. Pt is full code.   Palliative Medicine has been consulted to assist pt/family with establishing goals of care; code status discussion; family support and symptom management.    ??    [name] is laying in bed; deep breathing; occasionally grimaces but not following commands.  NG in place. Now with worsening blood pressure. White count and lactate  worsening. Patient continues to decline.  Poor outcome anticipated. EP not recommending AICD . DNRCCA.  US of abd notes Lack of flow in the portal vein, possible thrombosis; now with likely bowel ischemia.  No surgical intervention planned.  Morphine for pain. Spoke with daughter, [name] by phone; recommended hospice. States she has to work today but will be in ~ 5pm; states she likely will take pt home since that is where she wanted to die.  Consulted hospice; wants HOV after choices reviewed. Support given. Staff updated.       Physical Exam:  Vitals:    BP 90/71    Pulse 113    Temp 98.7 ??F (37.1 ??C) (Axillary)    Resp 21    Ht 5\' 5"  (1.651 m)    Wt 173 lb 15.1 oz (78.9 kg)    SpO2 100%    BMI 28.95 kg/m??   Gen:  lethargic; not responsive during exam; not following commands, occasionally grimaces  HEENT: Pupils equal and round; reactive to light  Neck: Supple  Lungs: CTA bilaterally; no audible rhonchi or wheezes noted  Heart: A-fib; RRR, no murmur, rub, or gallop noted during exam  Abd: Soft, non tender, non distended, BS  Ext: Moving all extremities, no edema, pulses present  Skin:  Warm and dry  Neuro: lethargic; not following commands; deep breathing; occasionally grimaces  ??       ALLERGIES:Aspirin; Chicken allergy; Eggs or egg-derived products; and Sulfa antibiotics    ??? albumin human  25 g Intravenous Q8H   ???  furosemide  40 mg Intravenous BID   ??? vancomycin  15 mg/kg Intravenous Q24H   ??? piperacillin-tazobactam  3.375 g Intravenous Q8H   ??? sodium chloride flush  10 mL Intravenous 2 times per day   ??? famotidine (PEPCID) injection  20 mg Intravenous BID   ??? thiamine  100 mg Intravenous Daily   ??? enoxaparin  1 mg/kg Subcutaneous Q12H   ??? miconazole nitrate   Topical BID   ??? sodium chloride (PF)  10 mL Intravenous Daily   ??? sodium chloride flush  10 mL Intravenous 2 times per day       glucose, dextrose, glucagon (rDNA), dextrose, morphine, sodium chloride flush, acetaminophen, magnesium hydroxide, miconazole  nitrate **AND** miconazole nitrate, mineral oil-hydrophilic petrolatum, sodium chloride flush, trimethobenzamide, ipratropium-albuterol, acetaminophen    ??? dextrose           Labs     CBC:   Lab Results   Component Value Date    WBC 23.6 04/13/2016    RBC 3.27 04/13/2016    HGB 9.8 04/13/2016    HCT 29.6 04/13/2016    PLT 87 04/13/2016    MCV 90.5 04/13/2016     BMP:    Lab Results   Component Value Date    NA 137 04/13/2016    K 3.9 04/13/2016    CL 104 04/13/2016    CO2 17 04/13/2016    BUN 8 04/13/2016    CREATININE 0.7 04/13/2016    GLUCOSE 40 04/13/2016    CALCIUM 7.5 04/13/2016     PT/INR:    Lab Results   Component Value Date    PROTIME 32.0 04/13/2016    INR 2.8 04/13/2016     12/19 CXR:   Impression: ??    ?? Persistent CHF pattern with bibasilar hazy opacities suggesting  pleural effusions and/or atelectasis. A superimposed pneumonic  infiltrate would be difficult to exclude and should be considered in  the appropriate clinical context.   12/17 [place] ABD;   Impression: ??    ?? 1. Liver cirrhosis.  2. Lack of flow in the portal vein, possible thrombosis.  3. Ascites.         Impression:  Principal Problem:    V-tach [facility])  Active Problems:    Moderate protein-calorie malnutrition (HCC)    COPD (chronic obstructive pulmonary disease) (HCC)    NSTEMI (non-ST elevated myocardial infarction) (HCC)    CAD (coronary artery disease), native coronary artery    Hyperlipidemia LDL goal <100    PUD (peptic ulcer disease)    Dementia    Acquired hypothyroidism    Hypokalemia    Persistent atrial fibrillation (HCC)    Esophageal ring    Sepsis (HCC)    UTI (urinary tract infection)    Cellulitis    Generalized abdominal pain        Assessment/Plan :   Michelle Elias Whittleis a 82 y.o.female with VT/cardiac arrest requiring defibrillation which persisted in the hospital and  was defibrillated again.  ??CPR was performed. Admitted for NSTEMI. Cardiology following-may consider MPI if family chooses further aggressive care but pt has had  functional decline; dementia and now refusing to take po. Has Elevated liver enzymes - EGD done noting gastric ulcers.  Pt remains full code; family to discuss further.  Family to set up meeting time to establish GOC and further discuss code status. Spoke with staff.  ??Asked daughter, Michelle Regal to call me tomorrow to set up family meeting time. Full code  12/14 Pt transferred out of the ICU. Still very confused/HOH/slurred speech.  Refusing meds and water. Spoke with daughter yesterday about need to address code status.  Wants full code until family discusses further.  Requested daughter call me today to discuss goals of care; states she is working but may be here later afternoon.  Pt more agitated today; pulls at lines. Requested tele sitter. Will follow along. Spoke with staff.     1545- message left on voice mail for daughter [name] since PM did not receive call back on plan to meet; again requested to set up meeting to plan GOC and discuss code status.     12/15 Family wishes to continue full support; ICD if necessary; artificial feeding if necessary; see above. SSW updated. Family wishes to take pt home with HHC.     12/19 Pt DNRCCA; pt has continued to decline; US of abd notes Lack of flow in the portal vein, possible thrombosis; now with likely bowel ischemia.  No surgical intervention planned.  Morphine for pain.  Recommended hospice. Spoke with daughter, carol by phone; agrees with Hospice consult; choiced; agrees with HOV.  Consult placed.  Staff updated.     Louellen Molderiane Carlito Bogert  MSN, APRN-CNS, [facility] 04/13/2016 10:27 AM    Time in minutes spent: 25    More than 50% of this interaction was related to counseling or information given r/t disease process; plan of care; and code status.      Thank you for allowing us to work with you in the care of this patient.      Patient assessment and plan discussed with Palliative Medicine Physician.

## 2016-04-13 NOTE — Progress Notes (Signed)
Liaison Informational Visit Note      Referral received from [facility]      Patient Name: [name]   DOB:  [birth year]  MRN:  [id number]    [place] date:  04/03/2016      Hospital Admitting Physician:  Ricardo Jericho, MD   PCP:  Posey Boyer, DO      Primary Insurance: Payor: [employer] Rossetti MEDICARE /  /  /    Secondary Insurance:  unknown    Emergency Contact:   Contact 1: Name: carol christoff  Contact 1: Number: [phone]  Contact 1: Relationship: daughter  Contact/Relation: Contact 1: Name: Cori Razor / Contact 1: Relationship: daughter       Phone:  Contact 1: Number: [phone]    Contact/Relation: Contact 2: Name: christie christoff / Contact 2: Relationship: grandtgr   Phone:Contact 2: Number: [phone]    Advance Directive  Advance directives received No  Patient has a documented healthcare surrogate  Discussed with: Family member  DPOA-HC Name-Relation:    Phone:       Terminal Diagnosis Terminal Diagnosis unknown at present and needs verified with physician      Current Hospital Problem List:   Patient Active Problem List   Diagnosis Code   ??? Moderate protein-calorie malnutrition ([place]) E44.0   ??? V-tach [facility]) I47.2   ??? COPD (chronic obstructive pulmonary disease) (HCC) J44.9   ??? NSTEMI (non-ST elevated myocardial infarction) (HCC) I21.4   ??? CAD (coronary artery disease), native coronary artery I25.10   ??? Hyperlipidemia LDL goal <100 E78.5   ??? PUD (peptic ulcer disease) K27.9   ??? Dementia F03.90   ??? Acquired hypothyroidism E03.9   ??? Hypokalemia E87.6   ??? Persistent atrial fibrillation (HCC) I48.1   ??? Esophageal ring K22.2   ??? Sepsis ([place]) A41.9   ??? UTI (urinary tract infection) N39.0   ??? Cellulitis L03.90   ??? Generalized abdominal pain R10.84       Code Status Order: Limited     Past Medical History:        Diagnosis Date   ??? A-fib ([place])    ??? Anticoagulated     on pradaxa   ??? Asthma    ??? COPD (chronic obstructive pulmonary disease) ([place])    ??? Depression    ??? Forgetfulness     capable of signing own  consents   ??? GERD (gastroesophageal reflux disease)    ??? HOH (hard of hearing)     no aides   ??? Hyperlipidemia    ??? Pain     bilateral upper quadrant /  for EGD   ??? Restless leg syndrome    ??? Thyroid disease      Past Surgical History:        Procedure Laterality Date   ??? ABDOMEN SURGERY  02/19/2016    laprascopic with graham patch of gastric ulcer   ??? BACK SURGERY  2010?    lumbar    ??? BREAST SURGERY      Lumpectomy   ??? ENDOSCOPY, COLON, DIAGNOSTIC  02/28/2015   ??? HYSTERECTOMY  1970's   ??? JOINT REPLACEMENT Bilateral 2011?    knee   ??? VARICOSE VEIN SURGERY Bilateral 2012       Allergies:  Aspirin; Chicken allergy; Eggs or egg-derived products; and Sulfa antibiotics    Family Goal: Continue current treatment with no artificial ventilation or chest compressions.    Meeting held with granddaughter Morey Hummingbird and spoke with son Ron over the phone.  Discharge Plan:  Discharge Disposition; unknown at this time  Facility Name: N/A    Referral received. Chart  Reviewed. Met with patient's granddaughter Morey Hummingbird in the family waiting room per daughter Carol's request and patient's son, Ron by phone. Explained hospice benefit and all levels of hospice services including the hospice house for short term symptom management. Once symptoms are managed, patient would be discharged to a routine level of care. Discussed hospice philosophy and no longer wanting to pursue aggressive treatment, focusing only on comfort care. Explained roles and frequencies of skilled nursing, Personal care team, SW, spiritual care, and non medical volunteers. Patient's granddaughter stated her mother is the patient's primary caregiver and was talking about taking patient home with hospice. Discussed family being responsible for the 24 hour caregiver with hospice nurses making intermittent visits. Granddaughter verbalized understanding. Patient's granddaughter expressed that her childrens birthdays are today and tomorrow and "she does not want her  grandmother to die on their birthdays". Patient's son Chriss Czar stated he still wants patient to continue with IV antibiotics, and continue with current treatment. He lives out of town and receiving treatment himself. He plans on coming into town later this week. Patient's son is POA and has given his sister Arbie Cookey permission to receive hospice information and assist with [place]. Ron stated he will talk with his sister later today to discuss this information. Discussed code status with Ron. Ron stated NO INTUBATION OR CHEST COMPRESSIONS. MEDS ONLY.  Patient's granddaughter stated she will relay this information to her mother and agrees to a follow up visit tomorrow. Elspeth Cho, SW and bedside RN.    HOTV plan:  1. HOTV will follow up tomorrow. Patient's son wants patient to continue with current treatment.  2. Hospice is not managing any aspect of the patient's care presently.  3. Please call HOTV with any questions at [phone].    Electronically signed by Rodney Cruise, RN on 04/13/2016 at 2:06 PM

## 2016-04-13 NOTE — Progress Notes (Signed)
CH[facility] Quality Flow/Interdisciplinary Rounds Progress Note        Quality Flow Rounds held on April 13, 2016    Disciplines Attending:  [facility]    [name] was admitted on 04/03/2016 12:22 PM    Anticipated Discharge Date:  Expected Discharge Date: 04/06/16    Disposition:    Braden Score:  Braden Scale Score: 12    Readmission Risk              Readmission Risk:        16.5       Age 80 or Greater:  1    Admitted from SNF or Requires Paid or Family Care:  0    Currently has CHF,COPD,ARF,CRI,or is on dialysis:  4    Takes more than 5 Prescription Medications:  4    Takes Digoxin,Insulin,Anticoagulants,Narcotics or ASA/Plavix:  2    Hospital Admit in Past 12 Months:  0    On Disability:  3    Patient Considers own Health:  2.5          Discussed patient goal for the day, patient clinical progression, and barriers to discharge.  The following Goal(s) of the Day/Commitment(s) have been identified:  Awaiting family possible Hospice today continue treatment      Michelle SprangWilfred Damoni [name]  April 13, 2016

## 2016-04-14 ENCOUNTER — Inpatient Hospital Stay: Admit: 2016-04-14 | Primary: Family Medicine

## 2016-04-14 LAB — CBC WITH AUTO DIFFERENTIAL
Basophils %: 0 % (ref 0.0–2.0)
Basophils Absolute: 0.01 E9/L (ref 0.00–0.20)
Eosinophils %: 0 % (ref 0.0–6.0)
Eosinophils Absolute: 0 E9/L — ABNORMAL LOW (ref 0.05–0.50)
Hematocrit: 26 % — ABNORMAL LOW (ref 34.0–48.0)
Hemoglobin: 8.4 g/dL — ABNORMAL LOW (ref 11.5–15.5)
Immature Granulocytes #: 0.11 E9/L
Immature Granulocytes %: 0.5 % (ref 0.0–5.0)
Lymphocytes %: 11.8 % — ABNORMAL LOW (ref 20.0–42.0)
Lymphocytes Absolute: 2.37 E9/L (ref 1.50–4.00)
MCH: 30 pg (ref 26.0–35.0)
MCHC: 32.3 % (ref 32.0–34.5)
MCV: 92.9 fL (ref 80.0–99.9)
MPV: 11.8 fL (ref 7.0–12.0)
Monocytes %: 7.2 % (ref 2.0–12.0)
Monocytes Absolute: 1.45 E9/L — ABNORMAL HIGH (ref 0.10–0.95)
Neutrophils %: 80.5 % — ABNORMAL HIGH (ref 43.0–80.0)
Neutrophils Absolute: 16.07 E9/L — ABNORMAL HIGH (ref 1.80–7.30)
Platelets: 64 E9/L — ABNORMAL LOW (ref 130–450)
RBC: 2.8 E12/L — ABNORMAL LOW (ref 3.50–5.50)
RDW: 18 fL — ABNORMAL HIGH (ref 11.5–15.0)
WBC: 20 E9/L — ABNORMAL HIGH (ref 4.5–11.5)

## 2016-04-14 LAB — COMPREHENSIVE METABOLIC PANEL
ALT: 27 U/L (ref 0–32)
AST: 50 U/L — ABNORMAL HIGH (ref 0–31)
Albumin: 2.9 g/dL — ABNORMAL LOW (ref 3.5–5.2)
Alkaline Phosphatase: 162 U/L — ABNORMAL HIGH (ref 35–104)
Anion Gap: 16 mmol/L (ref 7–16)
BUN: 13 mg/dL (ref 8–23)
CO2: 19 mmol/L — ABNORMAL LOW (ref 22–29)
Calcium: 8.1 mg/dL — ABNORMAL LOW (ref 8.6–10.2)
Chloride: 105 mmol/L (ref 98–107)
Creatinine: 0.8 mg/dL (ref 0.5–1.0)
GFR African American: >60
GFR Non-African American: >60 mL/min/{1.73_m2} (ref >=60)
Glucose: 61 mg/dL — ABNORMAL LOW (ref 74–109)
Potassium: 3.6 mmol/L (ref 3.5–5.0)
Sodium: 140 mmol/L (ref 132–146)
Total Bilirubin: 4.3 mg/dL — ABNORMAL HIGH (ref 0.0–1.2)
Total Protein: 4.7 g/dL — ABNORMAL LOW (ref 6.4–8.3)

## 2016-04-14 LAB — PHOSPHORUS: Phosphorus: 4 mg/dL (ref 2.5–4.5)

## 2016-04-14 LAB — PROTIME-INR
INR: 2.6
Protime: 29.6 s — ABNORMAL HIGH (ref 9.3–12.4)

## 2016-04-14 LAB — CULTURE, URINE
Urine Culture, Routine: <10000 — AB
Urine Culture, Routine: >100000

## 2016-04-14 LAB — LACTIC ACID
Lactic Acid: 3.1 mmol/L — ABNORMAL HIGH (ref 0.5–2.2)
Lactic Acid: 2.8 mmol/L — ABNORMAL HIGH (ref 0.5–2.2)

## 2016-04-14 LAB — PLATELET CONFIRMATION

## 2016-04-14 LAB — MAGNESIUM: Magnesium: 2 mg/dL (ref 1.6–2.6)

## 2016-04-14 LAB — VANCOMYCIN LEVEL, TROUGH: Vancomycin Tr: 20 ug/mL — ABNORMAL HIGH (ref 5.0–16.0)

## 2016-04-14 MED ORDER — FUROSEMIDE 10 MG/ML IJ SOLN
10 MG/ML | Freq: Two times a day (BID) | INTRAMUSCULAR | Status: AC
Start: 2016-04-14 — End: 2016-04-14
  Administered 2016-04-14 (×2): 40 mg via INTRAVENOUS

## 2016-04-14 MED ORDER — MORPHINE SULFATE (PF) 2 MG/ML IV SOLN
2 MG/ML | INTRAVENOUS | Status: DC | PRN
Start: 2016-04-14 — End: 2016-04-15
  Administered 2016-04-15 (×3): 1 mg via INTRAVENOUS

## 2016-04-14 MED ORDER — STERILE WATER FOR INJECTION (MIXTURES ONLY)
1 g | Freq: Three times a day (TID) | INTRAVENOUS | Status: DC
Start: 2016-04-14 — End: 2016-04-17
  Administered 2016-04-14 – 2016-04-17 (×9): 1 g via INTRAVENOUS

## 2016-04-14 MED FILL — LOVENOX 80 MG/0.8ML SC SOLN: 80 MG/0.8ML | SUBCUTANEOUS | Qty: 0.8

## 2016-04-14 MED FILL — TYLENOL 325 MG PO TABS: 325 MG | ORAL | Qty: 2

## 2016-04-14 MED FILL — FUROSEMIDE 10 MG/ML IJ SOLN: 10 MG/ML | INTRAMUSCULAR | Qty: 4

## 2016-04-14 MED FILL — THIAMINE HCL 100 MG/ML IJ SOLN: 100 MG/ML | INTRAMUSCULAR | Qty: 1

## 2016-04-14 MED FILL — MEROPENEM 1 G IV SOLR: 1 g | INTRAVENOUS | Qty: 1

## 2016-04-14 MED FILL — FAMOTIDINE 20 MG/2ML IV SOLN: 20 MG/2ML | INTRAVENOUS | Qty: 2

## 2016-04-14 MED FILL — MORPHINE SULFATE 2 MG/ML IJ SOLN: 2 MG/ML | INTRAMUSCULAR | Qty: 1

## 2016-04-14 MED FILL — NORMAL SALINE FLUSH 0.9 % IV SOLN: 0.9 % | INTRAVENOUS | Qty: 30

## 2016-04-14 MED FILL — ALBUMINAR-25 25 % IV SOLN: 25 % | INTRAVENOUS | Qty: 100

## 2016-04-14 MED FILL — NORMAL SALINE FLUSH 0.9 % IV SOLN: 0.9 % | INTRAVENOUS | Qty: 40

## 2016-04-14 MED FILL — ZOSYN 3-0.375 GM/50ML IV SOLN: INTRAVENOUS | Qty: 50

## 2016-04-14 NOTE — Progress Notes (Addendum)
Patient continues to attempt to pull at lines and tubes including foley catheter, korpack, and triple lumen catheter. Patient attempting to limb out of bed without assistance. Patient educated about importance of maintaining lines and safety. Patient showing no evidence of learning. Will continue to monitor.

## 2016-04-14 NOTE — Other (Signed)
Reaches for NGT when restraints are released

## 2016-04-14 NOTE — Other (Signed)
Attempts to remove feeding tube, unable to redirect

## 2016-04-14 NOTE — Progress Notes (Signed)
[facility]  Department of Internal Medicine   MICU Progress Note    Patient:  [name] 82 y.o. female   MRN: [id number]       Date of Service: 04/14/2016    Allergy: Aspirin; Chicken allergy; Eggs or egg-derived products; and Sulfa antibiotics    Subjective   Patient was seen and examined at the bedside. She was awake but not alert not following commend.      24 hr Interval Hx:   - Family changed the code status to limited. Considering hospice care, possibly tomorrow.   - Core track placed. Trickle feeds with dietitian consult.   - General surgery consulted for possible surgical intervention for lactic acidosis in the setting of portal vein thrombosis. Not a good candidate.   - LA improved and increased UOP after 2x doses of Lasix 40 mg injection yesterday. Ordered 2 more doses of lasix injection with Albumin today. Monitor LA and I&O   - Morphine administered for pain.   -Transfer out of ICU    Objective     VS:   Vitals:    04/14/16 1100   BP: 100/67   Pulse:    Resp: 21   Temp:    SpO2: 98%      I & O - 24hr:    Intake/Output Summary (Last 24 hours) at 04/14/16 1148  Last data filed at 04/14/16 1000   Gross per 24 hour   Intake              978 ml   Output             1320 ml   Net             -342 ml     I/O last 3 completed shifts:  In: 908 [I.V.:690; NG/GT:68; IV Piggyback:150]  Out: 1274 [Urine:1274] I/O this shift:  In: 70 [I.V.:40; NG/GT:30]  Out: 160 [Urine:160]   Weight change: 10 lb 0.9 oz (4.562 kg)    Physical Exam:  ?? Vitals: BP 100/67    Pulse 93    Temp 98.2 ??F (36.8 ??C) (Axillary)    Resp 21    Ht 5\' 5"  (1.651 m)    Wt 180 lb (81.6 kg)    SpO2 98%    BMI 29.95 kg/m??     I & O - 24hr: I/O this shift:  In: 70 [I.V.:40; NG/GT:30]  ?? Out: 160 [Urine:160]   ?? General Appearance:??mild distress, moderate distress and uncooperative  ?? HEENT: ??Head: Normocephalic, no lesions, without obvious abnormality.  ?? Neck:??supple, symmetrical, trachea midline  ?? Lung:??clear to auscultation  bilaterally  ?? Heart:??irregularly irregular rhythm  ?? Abdomen: soft, tender; bowel sounds normal; no masses, ??no organomegaly  ?? Extremities: ??right inner thigh eythematous rash extending on the medial surface towards the knee  ?? Musculokeletal:??No joint swelling, no muscle tenderness. ROM normal in all joints of extremities.   ?? Neurologic: Mental status: lethargic, responsive to pain  ??    Medications     Continuous Infusions:  ??? dextrose       Scheduled Meds:  ??? meropenem  1 g Intravenous Q8H   ??? furosemide  40 mg Intravenous BID   ??? albumin human  25 g Intravenous Q8H   ??? sodium chloride flush  10 mL Intravenous 2 times per day   ??? famotidine (PEPCID) injection  20 mg Intravenous BID   ??? thiamine  100 mg Intravenous Daily   ??? enoxaparin  1 mg/kg Subcutaneous Q12H   ??? miconazole nitrate   Topical BID   ??? sodium chloride (PF)  10 mL Intravenous Daily   ??? sodium chloride flush  10 mL Intravenous 2 times per day     PRN Meds: morphine, glucose, dextrose, glucagon (rDNA), dextrose, sodium chloride flush, acetaminophen, magnesium hydroxide, miconazole nitrate **AND** miconazole nitrate, mineral oil-hydrophilic petrolatum, sodium chloride flush, trimethobenzamide, ipratropium-albuterol, acetaminophen  Nutrition:   NPO     Labs and Imaging Studies     CBC:   Recent Labs      04/12/16   0300  04/13/16   0420  04/14/16   0500   WBC  7.8  23.6*  20.0*   HGB  10.3*  9.8*  8.4*   HCT  31.2*  29.6*  26.0*   MCV  90.7  90.5  92.9   PLT  94*  87*  64*       BMP:    Recent Labs      04/12/16   0300  04/13/16   0420  04/14/16   0500   NA  132  137  140   K  3.4*  3.9  3.6   CL  102  104  105   CO2  18*  17*  19*   BUN  6*  8  13   CREATININE  0.6  0.7  0.8   GLUCOSE  98  40*  61*       LIVER PROFILE:   Recent Labs      04/12/16   1755  04/13/16   0420  04/14/16   0500   AST  57*  62*  50*   ALT  33*  33*  27   BILIDIR  1.6*   --    --    BILITOT  2.7*  3.1*  4.3*   ALKPHOS  167*  176*  162*       PT/INR:   Recent Labs       04/13/16   0420  04/14/16   0500   PROTIME  32.0*  29.6*   INR  2.8  2.6       APTT:   No results for input(s): APTT in the last 72 hours.    Fasting Lipid Panel:    No results found for: CHOL, TRIG, HDL    Cardiac Enzymes:    Lab Results   Component Value Date    CKTOTAL 41 04/11/2016    CKMB 2.6 04/11/2016    TROPONINI 0.04 (H) 04/03/2016    TROPONINI 0.03 04/03/2016    TROPONINI <0.01 02/19/2016       Notable Cultures:      ??   Culture  Date sent  Result    resp panel 12/17 neg    Sputum 12/17 ??    Body Fluid ?? ??    Urine Strep pneumonia Ag 12/17       Urine Legionella Ag   12/17 ??    Blood  12/17      Urine  12/17  growth present for GNR   ??  ??   Antibiotic  Days  Day started   Vanc ?? 12/17     Zosyn      12/17              ??  Oxygen:   Nasal cannula L/min  ??   Face mask %  ??   Reservoirs mask %  ??   ??  ??  ????  Lines: ??Site ??Day ??Date inserted   ????TLC ????R IJ ?? 12/17????   ????PICC ?? ?? ??   ????Arterial line ?? ?? ??   ????Peripheral line ?? ?? ??   ?? ?? ?? ??   ??  ????DVT Prophylaxis ??   ????Heparin  ????x   ????Enoxaparin ??   ????PCDs ??   ??  Imaging studies:  ??Imaging ??Date ??Result   ??CXR ??12/17 ??b/l effusions + edema   ?? ?? ??   ?? ?? ??   ??  EKG: Rhythm Strip: afib, rate controlled  ??    Resident's Assessment and Plan     Neurology  Acute on chronic metabolic encephalopathy likely 2/2 possible sepsis in the setting of dementia  Possible Delirium  - Usually alert, oriented to self. Overnight agitated; ativan administered, less alert since then. Only responsive to painful stimuli.   - Daily neuro assessment  - Consider imaging studies if no improvement  ??- Morphine administered for pain.     Cardiovascular  AfibRVR, rate controlled, back to afib  NSTEMI  Possible volume overload  - On Dabigatran, digoxin at home. Dabigatran on hold in hospital. Has been on heparin drip at some point. Consider anticoagulation.   - AFRVR noted during RRT  - TTE showed good biventricular function  - Heart cath could not be done due to patient's dysphagia; which  avoids her to take po meds needed after heart cath.   - Not a good candidate for ICD-discussion noted; not a good candidate per EP since the life expectancy less than 1 year  - D/ced Cardizem drip   - LA improved and increased UOP after 2x doses of Lasix 40 mg injection yesterday. Ordered 2 more doses of lasix injection with Albumin today. Monitor LA and I&O   ??  Pulmonary  Volume overload  - B/l pleural effusions on the CXR  - ABG not remarkable for respiratory distress  - Lasix with albumin  ??  Renal  Lactic acidosis-improving  - likely 2/2 sepsis vs intestinal infarct in the setting of portal vein thrombosis  - IVF stopped in the setting of volume overload  - LA q6h  ??  Infectious Disease  Possible sepsis likely 2/2 possible UTI vs possible cellulitis  Possible Sepsis  - qSOFA 2 with AMS  - UCx pseudomonas UTI  - Procalcitonin 0.26  - Lactic acid around 4-5 trending down to 2.9 today in the setting of possible intestinal infarct with portal vein thrombosis  - IVF on hold due to volume overload  -D/ced Vanc and Zosyn.   -Started meropenem today (12/20)    Pseudomonas UTI  -Started meropenem today (12/20)  ??  Gastroenterology  Dysphagia  - Not clear etiology  - GI following  - Core track placed, Trickle feed with diet consult  - Denies offered food  - EGD showed 3 duodenal ulcers; on PPI  ??  Cirrhosis with unclear etio  - GI is following  ??  Portal vein thrombosis  - Lovenox therapeutic dose  - CT abdomen w contrast findings  ??  DVT/GI prophylaxis  Pepcid&Lovenox    Disposition: transfer out of ICU today    Nehemiah Massed, M.D., PGY-1   Internal Medicine Resident    Intensivist: Dr. Cheryll Cockayne  I personally saw, examined and provided care for the patient. Radiographs, labs and medication list were reviewed by me independently. I spoke with bedside nursing, therapists and consultants. Critical care services and times documented are independent of procedures and multidisciplinary  rounds with Residents. Additionally  comprehensive, multidisciplinary rounds were conducted with the MICU team. The case was discussed in detail and plans for care were established. Review of Residents documentation was conducted and revisions were made as appropriate. I agree with the above documented exam, problem list and plan of care.    Jules SchickMichael Jewel Venditto, M.D.   Pulmonary/Critical Care Medicine

## 2016-04-14 NOTE — Plan of Care (Signed)
Problem: Restraint Use - Nonviolent/Non-Self-Destructive Behavior:  Goal: Absence of restraint indications  Absence of restraint indications   Outcome: Not Met This Shift    Goal: Absence of restraint-related injury  Absence of restraint-related injury   Outcome: Met This Shift

## 2016-04-14 NOTE — Plan of Care (Signed)
Problem: Imbalanced Nutrition, Less than Body Requirements  Goal: STG - patient will complete eating  Outcome: Met This Shift      Problem: Restraint Use - Nonviolent/Non-Self-Destructive Behavior:  Goal: Absence of restraint indications  Absence of restraint indications   Outcome: Met This Shift    Goal: Absence of restraint-related injury  Absence of restraint-related injury   Outcome: Met This Shift      Problem: Pain:  Goal: Pain level will decrease  Pain level will decrease   Outcome: Met This Shift    Goal: Control of acute pain  Control of acute pain   Outcome: Met This Shift    Goal: Control of chronic pain  Control of chronic pain   Outcome: Met This Shift

## 2016-04-14 NOTE — Progress Notes (Signed)
Palliative Medicine   Progress Note  Clinical Nurse Specialist      Hospital Day: 12    Recommendations:  1. Psychosocial support of patient and family:no family present  2. Assist with goals of care:family considering hospice; had family event today so family likely not currently here  3. Anticipatory Guidance:arrhythmia/post cardiac arrest; dementia, malnourishment; poor prognosis,   4. Code Status:DNR-CCA.   5. Symptom Management - no acute issues at this time    CC: AMS    HPI:  Gay FillerRuth E Gouveia is a 82 y.o. female with significant past medical history of A-fib (on pradaxa; asthma, COPD,, GERD, depression and dementia, HLD who presented by EMS from home with altered mental status. ??Patient was found by family altered and barely responsive. ??She was noted to be in VT on monitoring; was defibrillated which persisted in the hospital and was defibrillated again. ??CPR was performed. ??CXR: notes cardiomegaly with pulmonary edema and a small right pleural effusion. CT of head shows no acute pathology but with chronic microvascular ischemic changes.  Cardiology and EP consulted. Pt was very confused but denied chest pain, shortness of breath, or other acute distress. [facility]   Pt was started on amiodarone; fluids, heparin and ASA but it's reported pt not taking orally. Pt underwent EGD on 12/12- noting 3 duodenal ulcers-recommendation for IV H2 blockers. Pt lives with daughter, [name] and has had HHC in the past. Pt is full code.   Palliative Medicine has been consulted to assist pt/family with establishing goals of care; code status discussion; family support and symptom management.    ??  Ms [name] is laying in bed; agitated at times.  No family present.   NG in place.  Poor outcome anticipated. NO plans for AICD . DNRCCA.  Has morphine for pain. Lactate down today 2.8. White count now trending down.  Plan for family to meet with Hospice- considering taking pt home with hospice care.  Plan  to transfer out of ICU today.  Support given. Spoke with staff      Physical Exam:  Vitals:    BP (!) 124/55    Pulse 93    Temp 98.2 ??F (36.8 ??C) (Axillary)    Resp 22    Ht 5\' 5"  (1.651 m)    Wt 180 lb (81.6 kg)    SpO2 96%    BMI 29.95 kg/m??   Gen:  lethargic but awake; not following commands, occasionally grimaces  HEENT: Pupils equal and round; reactive to light  Neck: Supple  Lungs: CTA bilaterally; no audible rhonchi or wheezes noted  Heart: A-fib; RRR, no murmur, rub, or gallop noted during exam  Abd: Soft, non tender, non distended, BS  Ext: Moving all extremities, no edema, pulses present  Skin:  Warm and dry  Neuro: lethargic but awake; not following commands;   ??       ALLERGIES:Aspirin; Chicken allergy; Eggs or egg-derived products; and Sulfa antibiotics    ??? meropenem  1 g Intravenous Q8H   ??? furosemide  40 mg Intravenous BID   ??? albumin human  25 g Intravenous Q8H   ??? sodium chloride flush  10 mL Intravenous 2 times per day   ??? famotidine (PEPCID) injection  20 mg Intravenous BID   ??? thiamine  100 mg Intravenous Daily   ??? enoxaparin  1 mg/kg Subcutaneous Q12H   ??? miconazole nitrate   Topical BID   ??? sodium chloride (PF)  10 mL  Intravenous Daily   ??? sodium chloride flush  10 mL Intravenous 2 times per day       morphine, glucose, dextrose, glucagon (rDNA), dextrose, sodium chloride flush, acetaminophen, magnesium hydroxide, miconazole nitrate **AND** miconazole nitrate, mineral oil-hydrophilic petrolatum, sodium chloride flush, trimethobenzamide, ipratropium-albuterol, acetaminophen    ??? dextrose           Labs     CBC:   Lab Results   Component Value Date    WBC 20.0 04/14/2016    RBC 2.80 04/14/2016    HGB 8.4 04/14/2016    HCT 26.0 04/14/2016    PLT 64 04/14/2016    MCV 92.9 04/14/2016     BMP:    Lab Results   Component Value Date    NA 140 04/14/2016    K 3.6 04/14/2016    CL 105 04/14/2016    CO2 19 04/14/2016    BUN 13 04/14/2016    CREATININE 0.8 04/14/2016    GLUCOSE 61 04/14/2016    CALCIUM  8.1 04/14/2016     PT/INR:    Lab Results   Component Value Date    PROTIME 29.6 04/14/2016    INR 2.6 04/14/2016     12/19 CXR:   Impression: ??    ?? Persistent CHF pattern with bibasilar hazy opacities suggesting  pleural effusions and/or atelectasis. A superimposed pneumonic  infiltrate would be difficult to exclude and should be considered in  the appropriate clinical context.   12/17 [place] ABD;   Impression: ??    ?? 1. Liver cirrhosis.  2. Lack of flow in the portal vein, possible thrombosis.  3. Ascites.         Impression:  Principal Problem:    V-tach [facility])  Active Problems:    Moderate protein-calorie malnutrition (HCC)    COPD (chronic obstructive pulmonary disease) (HCC)    NSTEMI (non-ST elevated myocardial infarction) (HCC)    CAD (coronary artery disease), native coronary artery    Hyperlipidemia LDL goal <100    PUD (peptic ulcer disease)    Dementia    Acquired hypothyroidism    Hypokalemia    Persistent atrial fibrillation (HCC)    Esophageal ring    Sepsis (HCC)    UTI (urinary tract infection)    Cellulitis    Generalized abdominal pain        Assessment/Plan :   Adonna Horsley Whittleis a 82 y.o.female with VT/cardiac arrest requiring defibrillation which persisted in the hospital and  was defibrillated again.  ??CPR was performed. Admitted for NSTEMI. Cardiology following-may consider MPI if family chooses further aggressive care but pt has had functional decline; dementia and now refusing to take po. Has Elevated liver enzymes - EGD done noting gastric ulcers.  Pt remains full code; family to discuss further.  Family to set up meeting time to establish GOC and further discuss code status. Spoke with staff.  ??Asked daughter, Okey Regal to call me tomorrow to set up family meeting time. Full code    12/14 Pt transferred out of the ICU. Still very confused/HOH/slurred speech.  Refusing meds and water. Spoke with daughter yesterday about need to address code status.  Wants full code until family discusses further.   Requested daughter call me today to discuss goals of care; states she is working but may be here later afternoon.  Pt more agitated today; pulls at lines. Requested tele sitter. Will follow along. Spoke with staff.     1545- message left on voice mail for  daughter [name] since PM did not receive call back on plan to meet; again requested to set up meeting to plan GOC and discuss code status.     12/15 Family wishes to continue full support; ICD if necessary; artificial feeding if necessary; see above. SSW updated. Family wishes to take pt home with HHC.     12/19 Pt DNRCCA; pt has continued to decline; US of abd notes Lack of flow in the portal vein, possible thrombosis; now with likely bowel ischemia.  No surgical intervention planned.  Morphine for pain.  Recommended hospice. Spoke with daughter, carol by phone; agrees with Hospice consult; choiced; agrees with HOV.  Consult placed.  Staff updated.     12/20 Pt awake but confused.  Plan to transfer out of the ICU.  Family still considering plan of care on d/c. Likely home with hospice care.  No family present - have family event today.  Will follow along.     Louellen Molderiane Christasia Angeletti  MSN, APRN-CNS, [facility]PN 04/14/2016 10:48 AM    Time in minutes spent: 15    More than 50% of this interaction was related to counseling or information given r/t disease process; plan of care; and code status.      Thank you for allowing us to work with you in the care of this patient.      Patient assessment and plan discussed with Palliative Medicine Physician.

## 2016-04-14 NOTE — Progress Notes (Signed)
Occupational Therapy  Pt on OT caseload for treatment and re-evaluation. However, pt has had a decline in function and is no longer appropriate for OT services. Pt will be taken off caseload, please defer ROM and positioning to nursing staff. Thank You.     Gloucester CourthousePaige Maaz Spiering, [place] CarolinaOTR/L  [id number]

## 2016-04-14 NOTE — Care Coordination-Inpatient (Signed)
Social Work/Discharge Planning:    Pt admitted on 12/9 for v tach. Pt has declined greatly since admission, Hospice has been consulted, family reports their preference would be for pt to return home with Hospice however do not want to make final decision as of yet (see Hospice note for greater details). Prognosis is poor, pt is limited code at this time.    Electronically signed by Marinda ElkGarrett Niklaus Mamaril, LSW on 04/14/2016 at 10:04 AM

## 2016-04-14 NOTE — Other (Signed)
Yelling out, thrashing in bed. Repositioned but continues to appear uncomfortable. Medicated with morphine 1 mg iv

## 2016-04-14 NOTE — Progress Notes (Signed)
[facility] Quality Flow/Interdisciplinary Rounds Progress Note        Quality Flow Rounds held on April 14, 2016    Disciplines Attending:  [facility]    Gay FillerRuth E Andre was admitted on 04/03/2016 12:22 PM    Anticipated Discharge Date:  Expected Discharge Date: 04/06/16    Disposition:    Braden Score:  Braden Scale Score: 12    Readmission Risk              Readmission Risk:        16.5       Age 80 or Greater:  1    Admitted from SNF or Requires Paid or Family Care:  0    Currently has CHF,COPD,ARF,CRI,or is on dialysis:  4    Takes more than 5 Prescription Medications:  4    Takes Digoxin,Insulin,Anticoagulants,Narcotics or ASA/Plavix:  2    Hospital Admit in Past 12 Months:  0    On Disability:  3    Patient Considers own Health:  2.5          Discussed patient goal for the day, patient clinical progression, and barriers to discharge.  The following Goal(s) of the Day/Commitment(s) have been identified:  Transfer to General floor      Matilde SprangWilfred Tiziana Cislo  April 14, 2016

## 2016-04-14 NOTE — Progress Notes (Signed)
TF Goal rate requested per MD. Hypoglycemia discussed.   Rec increase Semi-Elemental formula to 55 ml/hr as tolerated.   Will provide: 1320 ml tv, 1584 kcals, 99 gm pro, 1071 ml free water.  See RD Progress Note 12/19 for completed assessment.   Electronically signed by Hillard Dankereann Jinnie Onley, RD, LD on 04/14/2016 at 11:02 AM

## 2016-04-14 NOTE — Progress Notes (Signed)
Subjective:    Events of evening reviewed.  Patient very confused. Agitated overnight.  Status post EGD (04/06/2016).  Patient not eating.  No nausea or vomiting.    Objective:    BP (!) 125/104    Pulse 93    Temp 98.2 ??F (36.8 ??C) (Axillary)    Resp 17    Ht 5\' 5"  (1.651 m)    Wt 180 lb (81.6 kg)    SpO2 100%    BMI 29.95 kg/m??     Heart:  RRR, no murmurs, gallops, or rubs.  Lungs:  CTA bilaterally, no wheeze, rales or rhonchi  Abd: bowel sounds present, nontender, nondistended, no masses  Extrem:  No clubbing, cyanosis, or edema    CBC with Differential:    Lab Results   Component Value Date    WBC 20.0 04/14/2016    RBC 2.80 04/14/2016    HGB 8.4 04/14/2016    HCT 26.0 04/14/2016    PLT 64 04/14/2016    MCV 92.9 04/14/2016    MCH 30.0 04/14/2016    MCHC 32.3 04/14/2016    RDW 18.0 04/14/2016    LYMPHOPCT 11.8 04/14/2016    MONOPCT 7.2 04/14/2016    BASOPCT 0.0 04/14/2016    MONOSABS 1.45 04/14/2016    LYMPHSABS 2.37 04/14/2016    EOSABS 0.00 04/14/2016    BASOSABS 0.01 04/14/2016     CMP:    Lab Results   Component Value Date    NA 140 04/14/2016    K 3.6 04/14/2016    CL 105 04/14/2016    CO2 19 04/14/2016    BUN 13 04/14/2016    CREATININE 0.8 04/14/2016    GFRAA >60 04/14/2016    LABGLOM >60 04/14/2016    GLUCOSE 61 04/14/2016    PROT 4.7 04/14/2016    LABALBU 2.9 04/14/2016    CALCIUM 8.1 04/14/2016    BILITOT 4.3 04/14/2016    ALKPHOS 162 04/14/2016    AST 50 04/14/2016    ALT 27 04/14/2016     BMP:    Lab Results   Component Value Date    NA 140 04/14/2016    K 3.6 04/14/2016    CL 105 04/14/2016    CO2 19 04/14/2016    BUN 13 04/14/2016    LABALBU 2.9 04/14/2016    CREATININE 0.8 04/14/2016    CALCIUM 8.1 04/14/2016    GFRAA >60 04/14/2016    LABGLOM >60 04/14/2016    GLUCOSE 61 04/14/2016     Magnesium:    Lab Results   Component Value Date    MG 2.0 04/14/2016     Phosphorus:    Lab Results   Component Value Date    PHOS 4.0 04/14/2016     PT/INR:    Lab Results   Component Value Date    PROTIME 29.6  04/14/2016    INR 2.6 04/14/2016     PTT:    Lab Results   Component Value Date    APTT 55.8 04/06/2016   [APTT}     Assessment:    Patient Active Problem List   Diagnosis   ??? Moderate protein-calorie malnutrition (HCC)   ??? V-tach [facility](HCC)   ??? COPD (chronic obstructive pulmonary disease) (HCC)   ??? NSTEMI (non-ST elevated myocardial infarction) (HCC)   ??? CAD (coronary artery disease), native coronary artery   ??? Hyperlipidemia LDL goal <100   ??? PUD (peptic ulcer disease)   ??? Dementia   ??? Acquired hypothyroidism   ???  Hypokalemia   ??? Persistent atrial fibrillation (HCC)   ??? Esophageal ring   ??? Sepsis (HCC)   ??? UTI (urinary tract infection)   ??? Cellulitis   ??? Generalized abdominal pain       Plan:  Supplement diet.  EP on board.  Agree it is related to cardiac ischemia.  Agree AICD is not appropriate treatment.  Discussed with EP.  Continue breathing treatments and supplemental oxygen.  Cardiology on board.  Early conservative therapy chosen. Discussed at length with cardiology.  Agree interventional route would cause more harm than good for patient.  She does not have mental capacity to understand procedure or what is required after procedure.  Continue medical management of ascad.  Continue aggressive lipid therapy.  PPI therapy.  Patient very confused.    Continue synthroid.  Supplemented.  Rate controlled.  Elevated HR.  Cardiology and EP on board.  GI evaluation appreciated.  Status post EGD.   Secondary to UTI and cellulitis.  Antibiotics.  As above.   As above.  General surgery consulted.  Bowel ischemia suspected.    Patient continues to decline.  Poor outcome anticipated.  Hospice consulted.  Appreciate family discussions.[name].       Michelle Salsberry J Yordy Matton  MD  8:44 AM  04/14/2016

## 2016-04-14 NOTE — Progress Notes (Signed)
Visit made to pt's room, no family present, update from bedside nurse that pt is transferring out of MICU today. Call made to pt's daughter [name] and she will not be at the hospital until 1600-1700 today, asked her to let the nurse know when she arrives and they can contact [facility] so a liaison can come and provide her with information about hospice services for her mom.

## 2016-04-14 NOTE — Plan of Care (Signed)
Problem: Falls - Risk of  Goal: Absence of falls  Outcome: Met This Shift      Problem: Risk for Impaired Skin Integrity  Goal: Tissue integrity - skin and mucous membranes  Structural intactness and normal physiological function of skin and  mucous membranes.   Outcome: Met This Shift      Problem: Nutrition  Goal: Optimal nutrition therapy  Outcome: Met This Shift

## 2016-04-14 NOTE — Progress Notes (Signed)
Liaison Informational Visit Note      Referral received from         Call back number      Patient Name: [name]   DOB:  27-Mar-1928  MRN:  [id number]    Admit date:  04/03/2016    [facility]  Hospital Admitting Physician:  Minda Dittoobert J Moser, MD   PCP:  Otilio SaberJoseph J Gallo, DO      Primary Insurance: Payor: Francine GravenHUMANA MEDICARE /  /  /    Secondary Insurance:  unknown    Emergency Contact:   Contact 1: Name: carol christoff  Contact 1: Number: [phone]  Contact 1: Relationship: daughter  Contact/Relation: Contact 1: Name: Tommas Olpcarol christoff / Contact 1: Relationship: daughter       Phone:  Contact 1: Number: [phone]    Contact/Relation: Contact 2: Name: christie christoff / Contact 2: Relationship: grandtgr   Phone:Contact 2: Number: [phone]    Advance Directive  Advance directives received No  Patient has NOT completed an advance directive   Discussed with: Family member  DPOA-HC Name-Relation:    Phone:       Terminal Diagnosis Terminal Diagnosis unknown at present and needs verified with physician      Current Hospital Problem List:   Patient Active Problem List   Diagnosis Code   ??? Moderate protein-calorie malnutrition (HCC) E44.0   ??? V-tach [facility](HCC) I47.2   ??? COPD (chronic obstructive pulmonary disease) (HCC) J44.9   ??? NSTEMI (non-ST elevated myocardial infarction) (HCC) I21.4   ??? CAD (coronary artery disease), native coronary artery I25.10   ??? Hyperlipidemia LDL goal <100 E78.5   ??? PUD (peptic ulcer disease) K27.9   ??? Dementia F03.90   ??? Acquired hypothyroidism E03.9   ??? Hypokalemia E87.6   ??? Persistent atrial fibrillation (HCC) I48.1   ??? Esophageal ring K22.2   ??? Sepsis (HCC) A41.9   ??? UTI (urinary tract infection) N39.0   ??? Cellulitis L03.90   ??? Generalized abdominal pain R10.84       Code Status Order: Limited     Past Medical History:        Diagnosis Date   ??? A-fib (HCC)    ??? Anticoagulated     on pradaxa   ??? Asthma    ??? COPD (chronic obstructive pulmonary disease) (HCC)    ??? Depression    ???  Forgetfulness     capable of signing own consents   ??? GERD (gastroesophageal reflux disease)    ??? HOH (hard of hearing)     no aides   ??? Hyperlipidemia    ??? Pain     bilateral upper quadrant /  for EGD   ??? Restless leg syndrome    ??? Thyroid disease      Past Surgical History:        Procedure Laterality Date   ??? ABDOMEN SURGERY  02/19/2016    laprascopic with graham patch of gastric ulcer   ??? BACK SURGERY  2010?    lumbar    ??? BREAST SURGERY      Lumpectomy   ??? ENDOSCOPY, COLON, DIAGNOSTIC  02/28/2015   ??? HYSTERECTOMY  1970's   ??? JOINT REPLACEMENT Bilateral 2011?    knee   ??? VARICOSE VEIN SURGERY Bilateral 2012       Allergies:  Aspirin; Chicken allergy; Eggs or egg-derived products; and Sulfa antibiotics    Family Goal: Unknown    Meeting held with Daughter Tommas OlpCarol Christoff, and then  call tot he patient son Loni BeckwithRon Mannes at [phone].  I have provided the family with information about hospice services and options. I have discussed home hospice and also Hospice House for short term symptom management. I have discussed home hospice is intermittent care with the nurse visiting 1-3 times a week, Aids to visit 2-4 times a week, Child psychotherapistocial worker, TEFL teacherChaplin and Volunteers.  I have explained Hospice house for short term symptom management. This [facility] due to shortness of breath, agitation, and restlessness.   The daughter will not make any decisions until her and the brother Ron have information from the doctor that their mother is most likely not going to recover from this illness. I did discuss the NG tube feedings is a temporary measure to provide nutrition, and asked if they felt their mother would want a more permanent feeding tube. They felt that would most likely not, if her prognosis was short term survival.   Update to the bedside nurse and sticky note for physicain to call the son tomorrow at [phone].      Discharge Plan:undetermined  Discharge Disposition;  undetermined  Facility Name:     Electronically signed by Lanier Clameborah Shaquelle Hernon, RN on 04/14/2016 at 5:11 PM

## 2016-04-14 NOTE — Progress Notes (Signed)
Physical Therapy    Attempted to see patient for PT Re-eval.  Patient currently not following commands.  Will discontinue from caseload.      Daniel Nonesichard Elba Dendinger, PT, DPT  [phone]

## 2016-04-14 NOTE — Plan of Care (Signed)
Problem: Restraint Use - Nonviolent/Non-Self-Destructive Behavior:  Goal: Absence of restraint-related injury  Absence of restraint-related injury   Outcome: Met This Shift

## 2016-04-15 LAB — COMPREHENSIVE METABOLIC PANEL
ALT: 26 U/L (ref 0–32)
AST: 46 U/L — ABNORMAL HIGH (ref 0–31)
Albumin: 3.6 g/dL (ref 3.5–5.2)
Alkaline Phosphatase: 188 U/L — ABNORMAL HIGH (ref 35–104)
Anion Gap: 17 mmol/L — ABNORMAL HIGH (ref 7–16)
BUN: 16 mg/dL (ref 8–23)
CO2: 21 mmol/L — ABNORMAL LOW (ref 22–29)
Calcium: 8.2 mg/dL — ABNORMAL LOW (ref 8.6–10.2)
Chloride: 107 mmol/L (ref 98–107)
Creatinine: 0.8 mg/dL (ref 0.5–1.0)
GFR African American: >60
GFR Non-African American: >60 mL/min/{1.73_m2} (ref >=60)
Glucose: 102 mg/dL (ref 74–109)
Potassium: 2.8 mmol/L — ABNORMAL LOW (ref 3.5–5.0)
Sodium: 145 mmol/L (ref 132–146)
Total Bilirubin: 4 mg/dL — ABNORMAL HIGH (ref 0.0–1.2)
Total Protein: 5.1 g/dL — ABNORMAL LOW (ref 6.4–8.3)

## 2016-04-15 LAB — CBC WITH AUTO DIFFERENTIAL
Basophils %: 0 % (ref 0.0–2.0)
Basophils Absolute: 0 E9/L (ref 0.00–0.20)
Eosinophils %: 1 % (ref 0.0–6.0)
Eosinophils Absolute: 0.13 E9/L (ref 0.05–0.50)
Hematocrit: 23.1 % — ABNORMAL LOW (ref 34.0–48.0)
Hemoglobin: 7.4 g/dL — ABNORMAL LOW (ref 11.5–15.5)
Lymphocytes %: 4 % — ABNORMAL LOW (ref 20.0–42.0)
Lymphocytes Absolute: 0.51 E9/L — ABNORMAL LOW (ref 1.50–4.00)
MCH: 29.5 pg (ref 26.0–35.0)
MCHC: 32 % (ref 32.0–34.5)
MCV: 92 fL (ref 80.0–99.9)
MPV: 12.3 fL — ABNORMAL HIGH (ref 7.0–12.0)
Monocytes %: 6 % (ref 2.0–12.0)
Monocytes Absolute: 0.76 E9/L (ref 0.10–0.95)
Neutrophils %: 89 % — ABNORMAL HIGH (ref 43.0–80.0)
Neutrophils Absolute: 11.3 E9/L — ABNORMAL HIGH (ref 1.80–7.30)
Platelets: 51 E9/L — ABNORMAL LOW (ref 130–450)
RBC: 2.51 E12/L — ABNORMAL LOW (ref 3.50–5.50)
RDW: 18 fL — ABNORMAL HIGH (ref 11.5–15.0)
WBC: 12.7 E9/L — ABNORMAL HIGH (ref 4.5–11.5)
nRBC: 1 /100 WBC

## 2016-04-15 LAB — EKG 12-LEAD
Atrial Rate: 92 {beats}/min
P-R Interval: 190 ms
Q-T Interval: 348 ms
QRS Duration: 88 ms
QTc Calculation (Bazett): 430 ms
R Axis: 4 degrees
T Axis: 61 degrees
Ventricular Rate: 92 {beats}/min

## 2016-04-15 LAB — LACTIC ACID: Lactic Acid: 3.9 mmol/L — ABNORMAL HIGH (ref 0.5–2.2)

## 2016-04-15 LAB — PLATELET CONFIRMATION

## 2016-04-15 LAB — MAGNESIUM: Magnesium: 1.9 mg/dL (ref 1.6–2.6)

## 2016-04-15 MED ORDER — GLYCOPYRROLATE 0.2 MG/ML IJ SOLN
0.2 MG/ML | INTRAMUSCULAR | Status: DC | PRN
Start: 2016-04-15 — End: 2016-04-17
  Administered 2016-04-15 – 2016-04-17 (×16): 0.2 mg via INTRAVENOUS

## 2016-04-15 MED ORDER — GLYCOPYRROLATE 1 MG PO TABS
1 MG | Freq: Three times a day (TID) | ORAL | Status: DC
Start: 2016-04-15 — End: 2016-04-17

## 2016-04-15 MED ORDER — LORAZEPAM 2 MG/ML IJ SOLN
2 MG/ML | Freq: Four times a day (QID) | INTRAMUSCULAR | Status: DC | PRN
Start: 2016-04-15 — End: 2016-04-15
  Administered 2016-04-15 (×3): 0.5 mg via INTRAVENOUS

## 2016-04-15 MED ORDER — LORAZEPAM 2 MG/ML IJ SOLN
2 MG/ML | INTRAMUSCULAR | Status: DC | PRN
Start: 2016-04-15 — End: 2016-04-17
  Administered 2016-04-16 – 2016-04-17 (×5): 0.5 mg via INTRAVENOUS

## 2016-04-15 MED ORDER — MORPHINE SULFATE (PF) 2 MG/ML IV SOLN
2 MG/ML | INTRAVENOUS | Status: DC | PRN
Start: 2016-04-15 — End: 2016-04-17
  Administered 2016-04-16 – 2016-04-17 (×2): 1 mg via INTRAVENOUS

## 2016-04-15 MED FILL — MEROPENEM 1 G IV SOLR: 1 g | INTRAVENOUS | Qty: 1

## 2016-04-15 MED FILL — NORMAL SALINE FLUSH 0.9 % IV SOLN: 0.9 % | INTRAVENOUS | Qty: 10

## 2016-04-15 MED FILL — THIAMINE HCL 100 MG/ML IJ SOLN: 100 MG/ML | INTRAMUSCULAR | Qty: 1

## 2016-04-15 MED FILL — LOVENOX 80 MG/0.8ML SC SOLN: 80 MG/0.8ML | SUBCUTANEOUS | Qty: 0.8

## 2016-04-15 MED FILL — GLYCOPYRROLATE 0.2 MG/ML IJ SOLN: 0.2 MG/ML | INTRAMUSCULAR | Qty: 1

## 2016-04-15 MED FILL — LORAZEPAM 2 MG/ML IJ SOLN: 2 MG/ML | INTRAMUSCULAR | Qty: 1

## 2016-04-15 MED FILL — FAMOTIDINE 20 MG/2ML IV SOLN: 20 MG/2ML | INTRAVENOUS | Qty: 2

## 2016-04-15 MED FILL — GLYCOPYRROLATE 1 MG PO TABS: 1 MG | ORAL | Qty: 1

## 2016-04-15 MED FILL — MORPHINE SULFATE (PF) 2 MG/ML IV SOLN: 2 mg/mL | INTRAVENOUS | Qty: 1

## 2016-04-15 MED FILL — NORMAL SALINE FLUSH 0.9 % IV SOLN: 0.9 % | INTRAVENOUS | Qty: 20

## 2016-04-15 MED FILL — ALBUMINAR-25 25 % IV SOLN: 25 % | INTRAVENOUS | Qty: 100

## 2016-04-15 MED FILL — ALOE VESTA ANTIFUNGAL 2 % EX OINT: 2 % | CUTANEOUS | Qty: 56

## 2016-04-15 NOTE — Progress Notes (Signed)
Pt restraints removed due to expired restraint order. Doctor called. Awaiting new order. Continuing to monitor pt. Pt currently mildly agitated, no pulling at lines. Telesitter has been ordered until staff sees if new order for restraints needs to be obtained.

## 2016-04-15 NOTE — Progress Notes (Signed)
Faxed telesitter request.

## 2016-04-15 NOTE — Plan of Care (Signed)
Problem: Falls - Risk of  Goal: Absence of falls  Outcome: Met This Shift      Problem: Risk for Impaired Skin Integrity  Goal: Tissue integrity - skin and mucous membranes  Structural intactness and normal physiological function of skin and  mucous membranes.   Outcome: Met This Shift      Problem: Pain:  Goal: Control of acute pain  Control of acute pain   Outcome: Met This Shift

## 2016-04-15 NOTE — Plan of Care (Signed)
Problem: Falls - Risk of  Goal: Absence of falls  Outcome: Met This Shift      Problem: Pain:  Goal: Control of acute pain  Control of acute pain   Outcome: Met This Shift

## 2016-04-15 NOTE — Progress Notes (Signed)
Covering dr Maple Hudsonmoser    Subjective:    AWAKE  confused      Objective:    BP 125/66    Pulse 103    Temp 93 ??F (33.9 ??C) (Axillary)    Resp 24    Ht 5\' 5"  (1.651 m)    Wt 179 lb (81.2 kg)    SpO2 92%    BMI 29.79 kg/m??     Confused  Ng tube in place  Heart:  RRR, no murmurs, gallops, or rubs.  Lungs:  CTA bilaterally, no wheeze, rales or rhonchi  Abd: bowel sounds present, nontender, nondistended, no masses  Extrem:  No clubbing, cyanosis, or edema    CBC with Differential:    Lab Results   Component Value Date    WBC 12.7 04/15/2016    RBC 2.51 04/15/2016    HGB 7.4 04/15/2016    HCT 23.1 04/15/2016    PLT 51 04/15/2016    MCV 92.0 04/15/2016    MCH 29.5 04/15/2016    MCHC 32.0 04/15/2016    RDW 18.0 04/15/2016    NRBC 1.0 04/15/2016    LYMPHOPCT 4.0 04/15/2016    MONOPCT 6.0 04/15/2016    BASOPCT 0.0 04/15/2016    MONOSABS 0.76 04/15/2016    LYMPHSABS 0.51 04/15/2016    EOSABS 0.13 04/15/2016    BASOSABS 0.00 04/15/2016     CMP:    Lab Results   Component Value Date    NA 145 04/15/2016    K 2.8 04/15/2016    CL 107 04/15/2016    CO2 21 04/15/2016    BUN 16 04/15/2016    CREATININE 0.8 04/15/2016    GFRAA >60 04/15/2016    LABGLOM >60 04/15/2016    GLUCOSE 102 04/15/2016    PROT 5.1 04/15/2016    LABALBU 3.6 04/15/2016    CALCIUM 8.2 04/15/2016    BILITOT 4.0 04/15/2016    ALKPHOS 188 04/15/2016    AST 46 04/15/2016    ALT 26 04/15/2016     BMP:    Lab Results   Component Value Date    NA 145 04/15/2016    K 2.8 04/15/2016    CL 107 04/15/2016    CO2 21 04/15/2016    BUN 16 04/15/2016    LABALBU 3.6 04/15/2016    CREATININE 0.8 04/15/2016    CALCIUM 8.2 04/15/2016    GFRAA >60 04/15/2016    LABGLOM >60 04/15/2016    GLUCOSE 102 04/15/2016     Magnesium:    Lab Results   Component Value Date    MG 1.9 04/15/2016     Phosphorus:    Lab Results   Component Value Date    PHOS 4.0 04/14/2016     PT/INR:    Lab Results   Component Value Date    PROTIME 29.6 04/14/2016    INR 2.6 04/14/2016     PTT:    Lab Results    Component Value Date    APTT 55.8 04/06/2016   [APTT}     Assessment:    Patient Active Problem List   Diagnosis   ??? Moderate protein-calorie malnutrition (HCC)   ??? V-tach [facility](HCC)   ??? COPD (chronic obstructive pulmonary disease) (HCC)   ??? NSTEMI (non-ST elevated myocardial infarction) (HCC)   ??? CAD (coronary artery disease), native coronary artery   ??? Hyperlipidemia LDL goal <100   ??? PUD (peptic ulcer disease)   ??? Dementia   ??? Acquired hypothyroidism   ???  Hypokalemia   ??? Persistent atrial fibrillation (HCC)   ??? Esophageal ring   ??? Sepsis (HCC)   ??? UTI (urinary tract infection)   ??? Cellulitis   ??? Generalized abdominal pain       Plan:    Extensively discussed with son ron  He wants to discuss with cardiology and EP about further options  I recommended comfort measures  He is unsure  Agitation, dementia, cardiology reluctance regarding cardiac intervention discussed -he is still unsure    Loyola MastSudhir K Hogan Hoobler  MD  1:57 PM  04/15/2016

## 2016-04-15 NOTE — Progress Notes (Signed)
Pt remained in restraints throughout the night. Pt remained restless throughout the night as well. I repositioned her and released her hands from restraints every 2 hours allowing her to perform ROM. Pt was constantly trying to pull at lines.

## 2016-04-15 NOTE — Progress Notes (Signed)
Placed a call out to Dr. Maple HudsonMoser regarding possible order for medication to help dry up respiratory secretions. Will follow up.

## 2016-04-15 NOTE — Progress Notes (Signed)
Placed call to Palliative Care in order to obtain med order to help with respiratory secretions. No answer. Awaiting call back. Will follow up.

## 2016-04-15 NOTE — Progress Notes (Signed)
Pt's son called unit asking questions about pt in order to make decisions about pt's plan of care. Message sent on Perfect Serve to Dr. Buren KosNallapaneni to re-speak to pt's son.

## 2016-04-15 NOTE — Progress Notes (Signed)
Visit made to pt's room, no family present.  Pt lying in bed, restless.  Update to charge, bedside nurse and SW that family is waiting to hear from attending physician before making any decisions about goals for care and hospice.  Bedside and charge nurse both state that Dr. Buren KosNallapaneni will be rounding later and they will give him families contact numbers.    HOTV plan:  Hospice is not managing this pt's care at this time  Please call HOTV at [phone] with any questions.  Will await family decision regarding goal of care.

## 2016-04-15 NOTE — Plan of Care (Signed)
Problem: Risk for Impaired Skin Integrity  Goal: Tissue integrity - skin and mucous membranes  Structural intactness and normal physiological function of skin and  mucous membranes.   Outcome: Ongoing

## 2016-04-15 NOTE — Significant Event (Signed)
EP Quick Note:  I had an extensive 20min conversation with her son Ferne ReusRon (her DPOA) regarding her prior arrhythmia issues.  I explained that I had never seen his mother directly and that her arrhythmia issues were managed by Dr. Domenic MorasGemma last week.  I offered to explain the rationale which is in the chart regarding her PMVT which we did discuss.  He wishes further input regarding her other issues from Dr. Roylene ReasonNallapenini or Dr. Maple HudsonMoser.  All of his questions were answered to the best of my ability.    Logan BoresAllen Carlee Tesfaye, MD, [facility]  Cardiac Electrophysiology  [facility]  [facility]: [place] [facility]  3:58 PM  04/15/2016

## 2016-04-15 NOTE — Progress Notes (Signed)
The patients son Ferne ReusRon [facility](POA) is requesting to speak to the general suergon, cardiology EP before making any final decision about Hospice. A message was sent through perfect-serve to General surgery, Dr. Golden CircleNeubauer about speaking to the patient's son Ron [facility](POA) about the patients prognosis. Doctor Golden CircleNeubauer called back and stated that he is not familiar with this patients case since Dr. Madaline GuthrieSteiner saw her during the consult that she would be a better person to speak to the family regarding their concerns. A page was sent out to Dr. Madaline GuthrieSteiner by RN, and Dr. Golden CircleNeubauer also said that he would reach out to her as well. A message was also sent through perfect-serve to NP Mcfarland to see if they would be able to speak to Ron as well. Ron's phone number was left on the message so that they are able to reach out to him. Awaiting answer back.

## 2016-04-15 NOTE — Progress Notes (Signed)
Palliative Medicine   Progress Note  Clinical Nurse Specialist      Hospital Day: 13    Recommendations:  1. Psychosocial support of patient and family:no family present  2. Assist with goals of care:family considering hospice; family wanting INPUT FROM ATTENDING BEFORE MAKING HOSPICE DECISION  3. Anticipatory Guidance:arrhythmia/post cardiac arrest; dementia, malnourishment; poor prognosis,   4. Code Status:DNR-CCA.   5. Symptom Management -    Called this am for request to control secretions; added robinul IV 0.2 mg q 2 hrs prn    CC: AMS    HPI:  [name] is a 82 y.o. female with significant past medical history of A-fib (on pradaxa; asthma, COPD,, GERD, depression and dementia, HLD who presented by EMS from home with altered mental status. ??Patient was found by family altered and barely responsive. ??She was noted to be in VT on monitoring; was defibrillated which persisted in the hospital and was defibrillated again. ??CPR was performed. ??CXR: notes cardiomegaly with pulmonary edema and a small right pleural effusion. CT of head shows no acute pathology but with chronic microvascular ischemic changes.  Cardiology and EP consulted. Pt was very confused but denied chest pain, shortness of breath, or other acute distress. She was admitted to [facility] with NSTEMI.   Pt was started on amiodarone; fluids, heparin and ASA but it's reported pt not taking orally. Pt underwent EGD on 12/12- noting 3 duodenal ulcers-recommendation for IV H2 blockers. Pt lives with daughter, [name] and has had [place] in the past. Pt is full code.   Palliative Medicine has been consulted to assist pt/family with establishing goals of care; code status discussion; family support and symptom management.    ??  Called this am for request for med to control secretions; added robinul IV 0.2 mg q 2 hrs prn.  [name] is laying in bed with moist respirations.  Having some bleeding from nares-packed but has still with some oozing.  Pt is agitated at  times.  No family present.  NG in place with bleeding noted.  Poor outcome anticipated. NO plans for AICD . [place].  Has morphine for pain.  Family has met with hospice liaison but won't make decision until [place]. Pt transferred out of ICU yesterday.  Spoke with staff      Physical Exam:  Vitals:    BP 125/66    Pulse 103    Temp 93 ??F (33.9 ??C) (Axillary)    Resp 24    Ht '5\' 5"'  (1.651 m)    Wt 179 lb (81.2 kg)    SpO2 96%    BMI 29.79 kg/m??   Gen:  lethargic; not following commands, occasionally grimaces  HEENT: Pupils equal and round; reactive to light  Neck: Supple  Lungs: moist respirations; rhonchi bilaterally; nor wheezes noted  Heart: A-fib; RRR, no murmur, rub, or gallop noted during exam  Abd: Soft, non tender, non distended, BS  Ext: Moving all extremities, no edema, pulses present  Skin:  Warm and dry  Neuro: lethargic; eyes closed; not following commands;   ??       ALLERGIES:Aspirin; Chicken allergy; Eggs or egg-derived products; and Sulfa antibiotics    ??? glycopyrrolate  1 mg Oral TID   ??? meropenem  1 g Intravenous Q8H   ??? sodium chloride flush  10 mL Intravenous 2 times per day   ??? famotidine (PEPCID) injection  20 mg Intravenous BID   ??? thiamine  100 mg Intravenous Daily   ???  enoxaparin  1 mg/kg Subcutaneous Q12H   ??? miconazole nitrate   Topical BID   ??? sodium chloride (PF)  10 mL Intravenous Daily   ??? sodium chloride flush  10 mL Intravenous 2 times per day       glycopyrrolate, morphine, LORazepam, glucose, dextrose, glucagon (rDNA), dextrose, sodium chloride flush, acetaminophen, magnesium hydroxide, miconazole nitrate **AND** miconazole nitrate, mineral oil-hydrophilic petrolatum, sodium chloride flush, trimethobenzamide, ipratropium-albuterol, acetaminophen    ??? dextrose           Labs     CBC:   Lab Results   Component Value Date    WBC 12.7 04/15/2016    RBC 2.51 04/15/2016    HGB 7.4 04/15/2016    HCT 23.1 04/15/2016    PLT 51 04/15/2016    MCV 92.0 04/15/2016     BMP:     Lab Results   Component Value Date    NA 145 04/15/2016    K 2.8 04/15/2016    CL 107 04/15/2016    CO2 21 04/15/2016    BUN 16 04/15/2016    CREATININE 0.8 04/15/2016    GLUCOSE 102 04/15/2016    CALCIUM 8.2 04/15/2016     PT/INR:    Lab Results   Component Value Date    PROTIME 29.6 04/14/2016    INR 2.6 04/14/2016     12/20  CXR  Findings: Stable life support devices. Lungs are unchanged. Heart is  stable. No acute osseous abnormality.    ?? Impression: ??   ?? Stable pulmonary edema and effusions, unchanged from prior  exam.           Impression:  Principal Problem:    V-tach [facility])  Active Problems:    Moderate protein-calorie malnutrition (HCC)    COPD (chronic obstructive pulmonary disease) (HCC)    NSTEMI (non-ST elevated myocardial infarction) (HCC)    CAD (coronary artery disease), native coronary artery    Hyperlipidemia LDL goal <100    PUD (peptic ulcer disease)    Dementia    Acquired hypothyroidism    Hypokalemia    Persistent atrial fibrillation (HCC)    Esophageal ring    Sepsis (HCC)    UTI (urinary tract infection)    Cellulitis    Generalized abdominal pain        Assessment/Plan :   [name] a 82 y.o.female with VT/cardiac arrest requiring defibrillation which persisted in the hospital and  was defibrillated again.  ??CPR was performed. Admitted for NSTEMI. Cardiology following-may consider MPI if family chooses further aggressive care but pt has had functional decline; dementia and now refusing to take po. Has Elevated liver enzymes - EGD done noting gastric ulcers.  Pt remains full code; family to discuss further.  Family to set up meeting time to establish [place] and further discuss code status. Spoke with staff.  ??Asked daughter, [name] to call me tomorrow to set up family meeting time. Full code    12/14 Pt transferred out of the ICU. Still very confused/HOH/slurred speech.  Refusing meds and water. Spoke with daughter yesterday about need to address code status.  Wants full code until  family discusses further.  Requested daughter call me today to discuss goals of care; states she is working but may be here later afternoon.  Pt more agitated today; pulls at lines. Requested tele sitter. Will follow along. Spoke with staff.     1545- message left on voice mail for daughter [name] since PM did not  receive call back on plan to meet; again requested to set up meeting to plan [place] and discuss code status.     12/15 Family wishes to continue full support; ICD if necessary; artificial feeding if necessary; see above. SSW updated. Family wishes to take pt home with [place].     12/19 Pt [place]; pt has continued to decline; [place] of abd notes Lack of flow in the portal vein, possible thrombosis; now with likely bowel ischemia.  No surgical intervention planned.  Morphine for pain.  Recommended hospice. Spoke with daughter, [name] by phone; agrees with Hospice consult; choiced; agrees with HOV.  Consult placed.  Staff updated.     12/20 Pt awake but confused.  Plan to transfer out of the ICU.  Family still considering plan of care on d/c. Likely home with hospice care.  No family present - have family event today.  Will follow along.     12/21 Pt transferred out of ICU.  Still lethargic.  Having moist rapid respirations; has bleeding from nares.  Added Robinul IV this am 0.2 mg q 2 hrs prn to dry secretions.  Per staff; family won't make final decision about hospice until they have a chance to speak with attending.  Call out to physician. No family present at bedside/     Ollen Barges  MSN, APRN-CNS, [facility] 04/15/2016 11:54 AM    Time in minutes spent: 25    More than 50% of this interaction was related to counseling or information given r/t disease process; plan of care; and code status.      Thank you for allowing [place] to work with you in the care of this patient.      Patient assessment and plan discussed with Palliative Medicine Physician.

## 2016-04-15 NOTE — Progress Notes (Signed)
Spoke to Dr. Joen LauraAmorn in regards to families request to speak to someone in EP. Dr. Joen LauraAmorn did not physically see this patient but did state that he would talk to him and reiterate what Dr. Domenic MorasGemma wrote in his note.

## 2016-04-15 NOTE — Plan of Care (Signed)
Problem: Falls - Risk of  Goal: Absence of falls  Outcome: Met This Shift      Problem: Risk for Impaired Skin Integrity  Goal: Tissue integrity - skin and mucous membranes  Structural intactness and normal physiological function of skin and  mucous membranes.   Outcome: Met This Shift      Problem: Restraint Use - Nonviolent/Non-Self-Destructive Behavior:  Goal: Absence of restraint indications  Absence of restraint indications   Outcome: Met This Shift    Goal: Absence of restraint-related injury  Absence of restraint-related injury   Outcome: Met This Shift      Problem: Pain:  Goal: Control of acute pain  Control of acute pain   Outcome: Met This Shift

## 2016-04-16 LAB — EKG 12-LEAD
Atrial Rate: 100 {beats}/min
Atrial Rate: 98 {beats}/min
P Axis: 132 degrees
P-R Interval: 186 ms
Q-T Interval: 308 ms
Q-T Interval: 302 ms
QRS Duration: 90 ms
QRS Duration: 80 ms
QTc Calculation (Bazett): 397 ms
QTc Calculation (Bazett): 397 ms
R Axis: 11 degrees
R Axis: -6 degrees
T Axis: 180 degrees
T Axis: -143 degrees
Ventricular Rate: 100 {beats}/min
Ventricular Rate: 104 {beats}/min

## 2016-04-16 LAB — CULTURE, STOOL

## 2016-04-16 MED FILL — LORAZEPAM 2 MG/ML IJ SOLN: 2 MG/ML | INTRAMUSCULAR | Qty: 1

## 2016-04-16 MED FILL — GLYCOPYRROLATE 0.2 MG/ML IJ SOLN: 0.2 MG/ML | INTRAMUSCULAR | Qty: 1

## 2016-04-16 MED FILL — GLYCOPYRROLATE 1 MG PO TABS: 1 MG | ORAL | Qty: 1

## 2016-04-16 MED FILL — NORMAL SALINE FLUSH 0.9 % IV SOLN: 0.9 % | INTRAVENOUS | Qty: 10

## 2016-04-16 MED FILL — MEROPENEM 1 G IV SOLR: 1 g | INTRAVENOUS | Qty: 1

## 2016-04-16 MED FILL — THIAMINE HCL 100 MG/ML IJ SOLN: 100 MG/ML | INTRAMUSCULAR | Qty: 1

## 2016-04-16 MED FILL — FAMOTIDINE 20 MG/2ML IV SOLN: 20 MG/2ML | INTRAVENOUS | Qty: 2

## 2016-04-16 MED FILL — NORMAL SALINE FLUSH 0.9 % IV SOLN: 0.9 % | INTRAVENOUS | Qty: 20

## 2016-04-16 MED FILL — LOVENOX 80 MG/0.8ML SC SOLN: 80 MG/0.8ML | SUBCUTANEOUS | Qty: 0.8

## 2016-04-16 MED FILL — MORPHINE SULFATE (PF) 2 MG/ML IV SOLN: 2 mg/mL | INTRAVENOUS | Qty: 1

## 2016-04-16 NOTE — Progress Notes (Signed)
Visit made to pt's room, no family present.  Per EPIC notes pt's son has spoke with physicians now requesting to speak with attending again before making any decisions on pt' plan of care.  Attempted to call pt's son with no answer.  Will wait until attending speaks with family again to see if they want hospice involvement.  Pt is definitively appropriate for hospice care.

## 2016-04-16 NOTE — Progress Notes (Signed)
Covering dr Maple Hudsonmoser    Subjective:    AWAKE  confused      Objective:    BP 126/60    Pulse 107    Temp 93 ??F (33.9 ??C) (Axillary)    Resp 18    Ht 5\' 5"  (1.651 m)    Wt 181 lb (82.1 kg)    SpO2 92%    BMI 30.12 kg/m??     Confused  Ng tube in place draining bloody fluid  Heart:  RRR, no murmurs, gallops, or rubs.  Lungs:  CTA bilaterally, no wheeze, rales or rhonchi  Abd: bowel sounds present, nontender, nondistended, no masses  Extrem:  No clubbing, cyanosis, or edema    CBC with Differential:    Lab Results   Component Value Date    WBC 12.7 04/15/2016    RBC 2.51 04/15/2016    HGB 7.4 04/15/2016    HCT 23.1 04/15/2016    PLT 51 04/15/2016    MCV 92.0 04/15/2016    MCH 29.5 04/15/2016    MCHC 32.0 04/15/2016    RDW 18.0 04/15/2016    NRBC 1.0 04/15/2016    LYMPHOPCT 4.0 04/15/2016    MONOPCT 6.0 04/15/2016    BASOPCT 0.0 04/15/2016    MONOSABS 0.76 04/15/2016    LYMPHSABS 0.51 04/15/2016    EOSABS 0.13 04/15/2016    BASOSABS 0.00 04/15/2016     CMP:    Lab Results   Component Value Date    NA 145 04/15/2016    K 2.8 04/15/2016    CL 107 04/15/2016    CO2 21 04/15/2016    BUN 16 04/15/2016    CREATININE 0.8 04/15/2016    GFRAA >60 04/15/2016    LABGLOM >60 04/15/2016    GLUCOSE 102 04/15/2016    PROT 5.1 04/15/2016    LABALBU 3.6 04/15/2016    CALCIUM 8.2 04/15/2016    BILITOT 4.0 04/15/2016    ALKPHOS 188 04/15/2016    AST 46 04/15/2016    ALT 26 04/15/2016     BMP:    Lab Results   Component Value Date    NA 145 04/15/2016    K 2.8 04/15/2016    CL 107 04/15/2016    CO2 21 04/15/2016    BUN 16 04/15/2016    LABALBU 3.6 04/15/2016    CREATININE 0.8 04/15/2016    CALCIUM 8.2 04/15/2016    GFRAA >60 04/15/2016    LABGLOM >60 04/15/2016    GLUCOSE 102 04/15/2016     Magnesium:    Lab Results   Component Value Date    MG 1.9 04/15/2016     Phosphorus:    Lab Results   Component Value Date    PHOS 4.0 04/14/2016     PT/INR:    Lab Results   Component Value Date    PROTIME 29.6 04/14/2016    INR 2.6 04/14/2016     PTT:     Lab Results   Component Value Date    APTT 55.8 04/06/2016   [APTT}     Assessment:    Patient Active Problem List   Diagnosis   ??? Moderate protein-calorie malnutrition (HCC)   ??? V-tach [facility](HCC)   ??? COPD (chronic obstructive pulmonary disease) (HCC)   ??? NSTEMI (non-ST elevated myocardial infarction) (HCC)   ??? CAD (coronary artery disease), native coronary artery   ??? Hyperlipidemia LDL goal <100   ??? PUD (peptic ulcer disease)   ??? Dementia   ??? Acquired  hypothyroidism   ??? Hypokalemia   ??? Persistent atrial fibrillation (HCC)   ??? Esophageal ring   ??? Sepsis (HCC)   ??? UTI (urinary tract infection)   ??? Cellulitis   ??? Generalized abdominal pain   likely ischemic bowel    Plan:    Poor prognosis  Will again discuss with son      Loyola MastSudhir K Lehi Phifer  MD  9:27 AM  04/16/2016

## 2016-04-16 NOTE — Other (Addendum)
Patient Acct Nbr:  [id number]  Primary AUTH/CERT:  [id number]  Primary Insurance Company Name:   [employer]  Primary Insurance Plan Name:  HUMANA MEDICARE GOLD PLUS HMO  Primary Insurance Group Number:  [id number]  Primary Insurance Plan Type: C  Primary Insurance Policy Number:  [id number]

## 2016-04-16 NOTE — Progress Notes (Signed)
Palliative Medicine   Progress Note  Clinical Nurse Specialist      Hospital Day: 14    Recommendations:  1. Psychosocial support of patient and family: no family present  2. Assist with goals of care:family considering hospice; family wanting INPUT FROM ATTENDING BEFORE MAKING HOSPICE DECISION; appreciate EP updating son  3. Anticipatory Guidance:arrhythmia/post cardiac arrest; dementia, malnourishment; poor prognosis,   4. Code Status:DNR-CCA.   5. Symptom Management -    Secretions; robinul IV 0.2 mg q 2 hrs prn   Pain; has morphine for pain 1 mg q 2 hrs prn   Agitation; has Ativan IV 0.5 mg q 4 hrs prn    CC: AMS    HPI:  [name] is a 82 y.o. female with significant past medical history of A-fib (on pradaxa; asthma, COPD,, GERD, depression and dementia, HLD who presented by EMS from home with altered mental status. ??Patient was found by family altered and barely responsive. ??She was noted to be in VT on monitoring; was defibrillated which persisted in the hospital and was defibrillated again. ??CPR was performed. ??CXR: notes cardiomegaly with pulmonary edema and a small right pleural effusion. CT of head shows no acute pathology but with chronic microvascular ischemic changes.  Cardiology and EP consulted. Pt was very confused but denied chest pain, shortness of breath, or other acute distress. She was admitted to [facility] with NSTEMI.   Pt was started on amiodarone; fluids, heparin and ASA but it's reported pt not taking orally. Pt underwent EGD on 12/12- noting 3 duodenal ulcers-recommendation for IV H2 blockers. Pt lives with daughter, [name] and has had [place] in the past. Pt is full code.   Palliative Medicine has been consulted to assist pt/family with establishing goals of care; code status discussion; family support and symptom management.    ??  [name] is laying in bed; not responsive. Pt with dried blood in mouth; oozing from nose seems to have ceased. Appreciate input and family update from EP.  No  family present.  NG in place; dried blood around nares.   Poor outcome anticipated.  [place].  Has morphine for pain.  Family has met with hospice liaison but won't make decision until [name].  Family knows options for care; just making a final decision.  Spoke with staff      Physical Exam:  Vitals:    BP 116/75    Pulse 107    Temp 97.5 ??F (36.4 ??C) (Axillary)    Resp 18    Ht _0  (1.651 m)    Wt 181 lb (82.1 kg)    SpO2 92%    BMI 30.12 kg/m??   Gen:  lethargic; not following commands, occasionally grimaces  HEENT: Pupils equal and round; reactive to light; bleeding from nares has ceased  Neck: Supple  Lungs: moist respirations; rhonchi bilaterally; nor wheezes noted  Heart: A-fib; RRR, no murmur, rub, or gallop noted during exam  Abd: Soft, non tender, non distended, BS  Ext: Moving all extremities, no edema, pulses present  Skin:  Warm and dry  Neuro: lethargic; eyes closed; not following commands;   ??       ALLERGIES:Aspirin; Chicken allergy; Eggs or egg-derived products; and Sulfa antibiotics    ??? glycopyrrolate  1 mg Oral TID   ??? meropenem  1 g Intravenous Q8H   ??? sodium chloride flush  10 mL Intravenous 2 times per day   ??? famotidine (PEPCID) injection  20 mg  Intravenous BID   ??? thiamine  100 mg Intravenous Daily   ??? enoxaparin  1 mg/kg Subcutaneous Q12H   ??? miconazole nitrate   Topical BID   ??? sodium chloride (PF)  10 mL Intravenous Daily   ??? sodium chloride flush  10 mL Intravenous 2 times per day       glycopyrrolate, morphine, LORazepam, glucose, dextrose, glucagon (rDNA), dextrose, sodium chloride flush, acetaminophen, magnesium hydroxide, miconazole nitrate **AND** miconazole nitrate, mineral oil-hydrophilic petrolatum, sodium chloride flush, trimethobenzamide, ipratropium-albuterol, acetaminophen    ??? dextrose           Labs     CBC:   Lab Results   Component Value Date    WBC 12.7 04/15/2016    RBC 2.51 04/15/2016    HGB 7.4 04/15/2016    HCT 23.1 04/15/2016    PLT 51  04/15/2016    MCV 92.0 04/15/2016     BMP:    Lab Results   Component Value Date    NA 145 04/15/2016    K 2.8 04/15/2016    CL 107 04/15/2016    CO2 21 04/15/2016    BUN 16 04/15/2016    CREATININE 0.8 04/15/2016    GLUCOSE 102 04/15/2016    CALCIUM 8.2 04/15/2016     PT/INR:    Lab Results   Component Value Date    PROTIME 29.6 04/14/2016    INR 2.6 04/14/2016       Impression:  Principal Problem:    V-tach [facility])  Active Problems:    Moderate protein-calorie malnutrition (HCC)    COPD (chronic obstructive pulmonary disease) (HCC)    NSTEMI (non-ST elevated myocardial infarction) (HCC)    CAD (coronary artery disease), native coronary artery    Hyperlipidemia LDL goal <100    PUD (peptic ulcer disease)    Dementia    Acquired hypothyroidism    Hypokalemia    Persistent atrial fibrillation (HCC)    Esophageal ring    Sepsis (HCC)    UTI (urinary tract infection)    Cellulitis    Generalized abdominal pain        Assessment/Plan :   [name] a 82 y.o.female with VT/cardiac arrest requiring defibrillation which persisted in the hospital and  was defibrillated again.  ??CPR was performed. Admitted for NSTEMI. Cardiology following-may consider MPI if family chooses further aggressive care but pt has had functional decline; dementia and now refusing to take po. Has Elevated liver enzymes - EGD done noting gastric ulcers.  Pt remains full code; family to discuss further.  Family to set up meeting time to establish [place] and further discuss code status. Spoke with staff.  ??Asked daughter, [name] to call me tomorrow to set up family meeting time. Full code    12/14 Pt transferred out of the ICU. Still very confused/HOH/slurred speech.  Refusing meds and water. Spoke with daughter yesterday about need to address code status.  Wants full code until family discusses further.  Requested daughter call me today to discuss goals of care; states she is working but may be here later afternoon.  Pt more agitated today; pulls at  lines. Requested tele sitter. Will follow along. Spoke with staff.     1545- message left on voice mail for daughter [name] since PM did not receive call back on plan to meet; again requested to set up meeting to plan GOC and discuss code status.     12/15 Family wishes to continue full support; ICD if  necessary; artificial feeding if necessary; see above. SSW updated. Family wishes to take pt home with [place].     12/19 Pt [place]; pt has continued to decline; [place] of abd notes Lack of flow in the portal vein, possible thrombosis; now with likely bowel ischemia.  No surgical intervention planned.  Morphine for pain.  Recommended hospice. Spoke with daughter, carol by phone; agrees with Hospice consult; choiced; agrees with HOV.  Consult placed.  Staff updated.     12/20 Pt awake but confused.  Plan to transfer out of the ICU.  Family still considering plan of care on d/c. Likely home with hospice care.  No family present - have family event today.  Will follow along.     12/21 Pt transferred out of ICU.  Still lethargic.  Having moist rapid respirations; has bleeding from nares.  Added Robinul IV this am 0.2 mg q 2 hrs prn to dry secretions.  Per staff; family won't make final decision about hospice until they have a chance to speak with attending.  Call out to physician. No family present at bedside.     12/22 Still waiting for family to make decision on d/c.  Pt has comfort meds ordered prn. EP updated son by phone. Son awaiting further discussion from attending; knows options for care.  Very poor overall. Spoke with staff.     Ollen Barges  MSN, APRN-CNS, [facility] 04/16/2016 8:18 AM    Time in minutes spent: 25    More than 50% of this interaction was related to counseling or information given r/t disease process; plan of care; and code status.      Thank you for allowing [place] to work with you in the care of this patient.      Patient assessment and plan discussed with Palliative Medicine Physician.

## 2016-04-16 NOTE — Plan of Care (Signed)
Problem: Nutrition  Goal: Optimal nutrition therapy  Outcome: Ongoing  Nutrition Problem: Moderate malnutrition, in context of acute illness or injury  Intervention: Food and/or Nutrient Delivery: Modify current Tube Feeding (Continue current TF orders; Recommend Flush 100 ml H2O Q6 hrs)  Nutritional Goals: Tolerate TF

## 2016-04-16 NOTE — Plan of Care (Signed)
Problem: Falls - Risk of  Goal: Absence of falls  Outcome: Met This Shift

## 2016-04-16 NOTE — Care Coordination-Inpatient (Signed)
Social Work/DIscharge Planning:    Per progress notes, pt's son has not made a final decision about transition of care or hospice.  SW and HOTV following for discharge plan.     Electronically signed by Johnston EbbsKatie L Quantavis Obryant, LSW on 04/16/2016 at 10:27 AM

## 2016-04-16 NOTE — Progress Notes (Signed)
Nutrition Assessment    Type and Reason for Visit: Reassess    Nutrition Recommendations: Modify current Tube Feeding:   Continue current TF orders; Recommend Flush 100 ml H2O Q6 hrs   Provides: 1320 Tv, 1584 kcals, 99 gm protein, 1071 ml free fluids, 1471 ml total fluids (TF + flush).    Malnutrition Assessment:  ?? Malnutrition Status: Meets the criteria for moderate malnutrition  ?? Context: Acute illness or injury  ?? Findings of the 6 clinical characteristics of malnutrition (Minimum of 2 out of 6 clinical characteristics is required to make the diagnosis of moderate or severe Protein Calorie Malnutrition based on AND/ASPEN Guidelines):  1. Energy Intake-Less than or equal to 50%, not able to assess    2. Weight Loss-Unable to assess (d/t fluid), unable to assess  3. Fat Loss-Mild subcutaneous fat loss, Orbital  4. Muscle Loss-Mild muscle mass loss, Clavicles (pectoralis and deltoids), Temples (temporalis muscle)  5. Fluid Accumulation-Mild fluid accumulation, Extremities  6. Grip Strength-Not measured    Nutrition Diagnosis:   ?? Problem: Moderate malnutrition, in context of acute illness or injury  ?? Etiology: related to Cognitive or neurological impairment    ??? Signs and symptoms:  as evidenced by NPO status due to medical condition, Nutrition support - EN, Diet history of poor intake    Nutrition Assessment:  ?? Subjective Assessment: s/p RRT  ?? Nutrition-Focused Physical Findings: distant BS, diarrhea, disorientation, lethargic, +3 pitting BUE edema, +2 pitting BLE edema, +I/Os, awaiting family decision on POC  ?? Wound Type: Multiple, Skin Tears  ?? Current Nutrition Therapies:  ?? Oral Diet Orders: NPO   ?? Oral Diet intake:  (previously refusing to eat)  ?? Tube Feeding (TF) Orders:   ?? Feeding Route: Nasoenteric  ?? Formula: Semi-elemental  ?? Rate (ml/hr):55 ml/hr    ?? Volume (ml/day): 1320  ?? Duration: Continuous 24hrs  ?? TF Residuals: Less than or equal to 250ml  ?? Water Flushes: None  ?? Current TF &  Flush Orders Provides: 1584 kcals, 99 gm protein, 1071 ml free fluids (TF currently paused per discussion w/ RN)  ?? Anthropometric Measures:  ?? Ht: 5\' 5"  (165.1 cm)   ?? Current Body Wt: 177 lb 8 oz (80.5 kg) (12/22 bed scale)  ?? Admission Body Wt: 157 lb (71.2 kg) (12/9 first measured)  ?? Usual Body Wt: 156 lb (70.8 kg) (EMR actual 02/09/16)  ?? % Weight Change: wt fluctuations observed throughout admit w/ noted volume overload,     ?? Ideal Body Wt: 125 lb (56.7 kg), % Ideal Body 126% (using admit wt d/t fluids)  ?? Adjusted Body Wt:  , body weight adjusted for    ?? BMI Classification: BMI 25.0 - 29.9 Overweight (using admit wt)  ?? Comparative Standards (Estimated Nutrition Needs):  ?? Estimated Daily Total Kcal: 1500-1600  ?? Estimated Daily Protein (g): 95-110  ?? Estimated Daily Fluid (ml/day): Marland Kitchen.    Estimated Intake vs Estimated Needs: Intake Less Than Needs (TF currently paused)    Nutrition Risk Level: Moderate    Nutrition Interventions:   Modify current Tube Feeding (Continue current TF orders; Recommend Flush 100 ml H2O Q6 hrs)  Coordination of Care, Continued Inpatient Monitoring (Discussed TF tolerance w/ RN; Nasoenteric in place draining blood- currently paused)    Nutrition Evaluation:   ?? Evaluation: Goals set   ?? Goals: Tolerate TF   ?? Monitoring: TF Intake, TF Tolerance, Gastric Residuals, Skin Integrity, Wound Healing, Fluid Balance, Ascites/Edema, Mental Status/Confusion, Weight, Comparative  Standards, Pertinent Labs, Diarrhea    See Adult Nutrition Doc Flowsheet for more detail.     Electronically signed by Diona FoleyJacqueline M Adalynd Donahoe, RD, LD on 04/16/16 at 3:28 PM    Contact Number: [phone]

## 2016-04-16 NOTE — Plan of Care (Signed)
Problem: Falls - Risk of  Goal: Absence of falls  Outcome: Met This Shift      Problem: Restraint Use - Nonviolent/Non-Self-Destructive Behavior:  Goal: Absence of restraint indications  Absence of restraint indications   Outcome: Met This Shift    Goal: Absence of restraint-related injury  Absence of restraint-related injury   Outcome: Met This Shift

## 2016-04-17 LAB — CULTURE BLOOD #1: Blood Culture, Routine: 5

## 2016-04-17 MED ORDER — GLYCOPYRROLATE 0.2 MG/ML IJ SOLN
0.2 MG/ML | INTRAMUSCULAR | Status: DC | PRN
Start: 2016-04-17 — End: 2016-04-20

## 2016-04-17 MED ORDER — SODIUM CHLORIDE 0.9 % IN NEBU
0.9 % | RESPIRATORY_TRACT | Status: DC | PRN
Start: 2016-04-17 — End: 2016-04-20

## 2016-04-17 MED ORDER — IPRATROPIUM-ALBUTEROL 0.5-2.5 (3) MG/3ML IN SOLN
Freq: Four times a day (QID) | RESPIRATORY_TRACT | Status: DC
Start: 2016-04-17 — End: 2016-04-20

## 2016-04-17 MED ORDER — ONDANSETRON 4 MG PO TBDP
4 MG | Freq: Four times a day (QID) | ORAL | Status: DC | PRN
Start: 2016-04-17 — End: 2016-04-20

## 2016-04-17 MED ORDER — SODIUM CHLORIDE FLUSH 0.9 % IV SOLN
0.9 % | INTRAVENOUS | Status: DC | PRN
Start: 2016-04-17 — End: 2016-04-20

## 2016-04-17 MED ORDER — BISACODYL 10 MG RE SUPP
10 MG | RECTAL | Status: DC | PRN
Start: 2016-04-17 — End: 2016-04-20

## 2016-04-17 MED ORDER — MAGIC MOUTHWASH
Status: DC | PRN
Start: 2016-04-17 — End: 2016-04-20

## 2016-04-17 MED ORDER — ACETAMINOPHEN 325 MG PO TABS
325 MG | ORAL | Status: DC | PRN
Start: 2016-04-17 — End: 2016-04-20

## 2016-04-17 MED ORDER — BACLOFEN 10 MG PO TABS
10 MG | Freq: Two times a day (BID) | ORAL | Status: DC | PRN
Start: 2016-04-17 — End: 2016-04-20

## 2016-04-17 MED ORDER — POLYVINYL ALCOHOL 1.4 % OP SOLN
1.4 % | Freq: Four times a day (QID) | OPHTHALMIC | Status: DC
Start: 2016-04-17 — End: 2016-04-20

## 2016-04-17 MED ORDER — NYSTATIN 100000 UNIT/ML MT SUSP
100000 UNIT/ML | Freq: Four times a day (QID) | OROMUCOSAL | Status: DC
Start: 2016-04-17 — End: 2016-04-20

## 2016-04-17 MED ORDER — LOPERAMIDE HCL 2 MG PO CAPS
2 MG | ORAL | Status: DC | PRN
Start: 2016-04-17 — End: 2016-04-20

## 2016-04-17 MED ORDER — GUAIFENESIN 100 MG/5ML PO SOLN
100 MG/5ML | ORAL | Status: DC | PRN
Start: 2016-04-17 — End: 2016-04-20

## 2016-04-17 MED ORDER — MAGNESIUM HYDROXIDE 400 MG/5ML PO SUSP
400 MG/5ML | Freq: Every day | ORAL | Status: DC | PRN
Start: 2016-04-17 — End: 2016-04-20

## 2016-04-17 MED ORDER — SENNA-DOCUSATE SODIUM 8.6-50 MG PO TABS
Freq: Two times a day (BID) | ORAL | Status: DC
Start: 2016-04-17 — End: 2016-04-20

## 2016-04-17 MED ORDER — ALBUTEROL SULFATE (2.5 MG/3ML) 0.083% IN NEBU
RESPIRATORY_TRACT | Status: DC | PRN
Start: 2016-04-17 — End: 2016-04-20

## 2016-04-17 MED ORDER — GLYCERIN (LAXATIVE) 2.1 G RE SUPP
2.1 g | Freq: Every day | RECTAL | Status: DC | PRN
Start: 2016-04-17 — End: 2016-04-20

## 2016-04-17 MED ORDER — SENNA-DOCUSATE SODIUM 8.6-50 MG PO TABS
Freq: Every day | ORAL | Status: DC | PRN
Start: 2016-04-17 — End: 2016-04-20

## 2016-04-17 MED ORDER — ONDANSETRON HCL 4 MG PO TABS
4 MG | Freq: Four times a day (QID) | ORAL | Status: DC | PRN
Start: 2016-04-17 — End: 2016-04-20

## 2016-04-17 MED ORDER — GLYCOPYRROLATE 0.2 MG/ML IJ SOLN
0.2 MG/ML | Freq: Four times a day (QID) | INTRAMUSCULAR | Status: DC
Start: 2016-04-17 — End: 2016-04-20

## 2016-04-17 MED ORDER — GUAIFENESIN-DM 100-10 MG/5ML PO SYRP
100-10 MG/5ML | ORAL | Status: DC | PRN
Start: 2016-04-17 — End: 2016-04-20

## 2016-04-17 MED ORDER — POLYVINYL ALCOHOL 1.4 % OP SOLN
1.4 % | OPHTHALMIC | Status: DC | PRN
Start: 2016-04-17 — End: 2016-04-20

## 2016-04-17 MED ORDER — MORPHINE SULFATE 2 MG/ML IJ SOLN
2 MG/ML | INTRAMUSCULAR | Status: DC | PRN
Start: 2016-04-17 — End: 2016-04-20

## 2016-04-17 MED ORDER — ACETAMINOPHEN 650 MG RE SUPP
650 MG | RECTAL | Status: DC | PRN
Start: 2016-04-17 — End: 2016-04-20

## 2016-04-17 MED ORDER — CAMPHOR-MENTHOL 0.5-0.5 % EX LOTN
CUTANEOUS | Status: DC | PRN
Start: 2016-04-17 — End: 2016-04-20

## 2016-04-17 MED ORDER — LORAZEPAM 2 MG/ML IJ SOLN
2 MG/ML | INTRAMUSCULAR | Status: DC | PRN
Start: 2016-04-17 — End: 2016-04-20

## 2016-04-17 MED ORDER — OLANZAPINE 5 MG PO TBDP
5 MG | Freq: Every day | ORAL | Status: DC | PRN
Start: 2016-04-17 — End: 2016-04-20

## 2016-04-17 MED ORDER — HYDROXYZINE HCL 10 MG PO TABS
10 MG | ORAL | Status: DC | PRN
Start: 2016-04-17 — End: 2016-04-20

## 2016-04-17 MED ORDER — GLYCERIN 35 % MT LIQD
35 % | OROMUCOSAL | Status: DC | PRN
Start: 2016-04-17 — End: 2016-04-20

## 2016-04-17 MED ORDER — ALUM & MAG HYDROXIDE-SIMETH 200-200-20 MG/5ML PO SUSP
200-200-20 MG/5ML | ORAL | Status: DC | PRN
Start: 2016-04-17 — End: 2016-04-20

## 2016-04-17 MED FILL — MORPHINE SULFATE (PF) 2 MG/ML IV SOLN: 2 mg/mL | INTRAVENOUS | Qty: 1

## 2016-04-17 MED FILL — GLYCOPYRROLATE 0.2 MG/ML IJ SOLN: 0.2 MG/ML | INTRAMUSCULAR | Qty: 2

## 2016-04-17 MED FILL — LORAZEPAM 2 MG/ML IJ SOLN: 2 MG/ML | INTRAMUSCULAR | Qty: 1

## 2016-04-17 MED FILL — GLYCOPYRROLATE 0.2 MG/ML IJ SOLN: 0.2 MG/ML | INTRAMUSCULAR | Qty: 1

## 2016-04-17 MED FILL — NORMAL SALINE FLUSH 0.9 % IV SOLN: 0.9 % | INTRAVENOUS | Qty: 20

## 2016-04-17 MED FILL — NORMAL SALINE FLUSH 0.9 % IV SOLN: 0.9 % | INTRAVENOUS | Qty: 10

## 2016-04-17 MED FILL — MORPHINE SULFATE 2 MG/ML IJ SOLN: 2 MG/ML | INTRAMUSCULAR | Qty: 1

## 2016-04-17 MED FILL — NORMAL SALINE FLUSH 0.9 % IV SOLN: 0.9 % | INTRAVENOUS | Qty: 40

## 2016-04-17 MED FILL — MEROPENEM 1 G IV SOLR: 1 g | INTRAVENOUS | Qty: 1

## 2016-04-17 MED FILL — FAMOTIDINE 20 MG/2ML IV SOLN: 20 MG/2ML | INTRAVENOUS | Qty: 2

## 2016-04-17 NOTE — Progress Notes (Signed)
Follow up visit made to unit. No family present. Spoke with Press photographercharge nurse. Call placed to Ron, patient's son. Family has decided to elect hospice services and would like patient to be evaluated for the hospice house. Patient is minimally responsive. Patient has labored breathing, respirations are moist and 22. Rhonchi lung sounds. Per staff, patient has been restlessness and agitated at times. Patient has been medicated with Robinul 0.2mg  IVP x10 doses in last 24 hours for increased secretions, Ativan 0.5mg  IVP x4 doses in last 24 hours for restlessness, and one dose of Morphine Sulfate 1mg  IVP. Patient has edema to feet. Knees ans feet are cool to touch. [facility], reviewed assessment and chart. Patient has been accepted to the hospice house for short term symptom management for increased secretions and restlessness related to terminal diagnosis of S/P cardiac arrest. Call placed to HOTV intake department and received time of 4PM to room 106. [facility]. [facility] Call placed to [facility] house. Nurse unavailable for nurse to nurse report. Faxed face sheet. H/P. Consults,  MAR, and report sheet. Updated Ron, patient's POA, daughter, charge nurse, bedside RN and DR. Nallapaneni. OK to leave IV sites, Foley and fecal management system in place.

## 2016-04-17 NOTE — Plan of Care (Signed)
Problem: Falls - Risk of  Goal: Absence of falls  Outcome: Met This Shift      Problem: Risk for Impaired Skin Integrity  Goal: Tissue integrity - skin and mucous membranes  Structural intactness and normal physiological function of skin and  mucous membranes.   Outcome: Met This Shift      Problem: Pain:  Goal: Control of acute pain  Control of acute pain   Outcome: Met This Shift

## 2016-04-17 NOTE — Plan of Care (Signed)
Problem: Falls - Risk of  Goal: Absence of falls  Outcome: Met This Shift      Problem: Pain:  Goal: Control of acute pain  Control of acute pain   Outcome: Met This Shift

## 2016-04-17 NOTE — Progress Notes (Signed)
Visit made to unit. No family present. [facility] Retail bankerbedside RN. Patient's son and daughter spoke with Dr. Buren KosNallapaneni this morning. Will follow up with family this afternoon to see if they have made a decision regarding hospice care. Updated charge nurse.

## 2016-04-17 NOTE — Progress Notes (Signed)
Covering dr Maple Hudsonmoser    Subjective:    Confused  agitated      Objective:    BP (!) 146/76    Pulse 105    Temp 93.2 ??F (34 ??C)    Resp 22    Ht 5\' 5"  (1.651 m)    Wt 184 lb 1.6 oz (83.5 kg)    SpO2 97%    BMI 30.64 kg/m??     Confused  Ng tube in place draining bloody fluid  Heart:  RRR, no murmurs, gallops, or rubs.  Lungs:  CTA bilaterally, no wheeze, rales or rhonchi  Abd: bowel sounds present, nontender, nondistended, no masses  Extrem:  No clubbing, cyanosis, or edema    CBC with Differential:    Lab Results   Component Value Date    WBC 12.7 04/15/2016    RBC 2.51 04/15/2016    HGB 7.4 04/15/2016    HCT 23.1 04/15/2016    PLT 51 04/15/2016    MCV 92.0 04/15/2016    MCH 29.5 04/15/2016    MCHC 32.0 04/15/2016    RDW 18.0 04/15/2016    NRBC 1.0 04/15/2016    LYMPHOPCT 4.0 04/15/2016    MONOPCT 6.0 04/15/2016    BASOPCT 0.0 04/15/2016    MONOSABS 0.76 04/15/2016    LYMPHSABS 0.51 04/15/2016    EOSABS 0.13 04/15/2016    BASOSABS 0.00 04/15/2016     CMP:    Lab Results   Component Value Date    NA 145 04/15/2016    K 2.8 04/15/2016    CL 107 04/15/2016    CO2 21 04/15/2016    BUN 16 04/15/2016    CREATININE 0.8 04/15/2016    GFRAA >60 04/15/2016    LABGLOM >60 04/15/2016    GLUCOSE 102 04/15/2016    PROT 5.1 04/15/2016    LABALBU 3.6 04/15/2016    CALCIUM 8.2 04/15/2016    BILITOT 4.0 04/15/2016    ALKPHOS 188 04/15/2016    AST 46 04/15/2016    ALT 26 04/15/2016     BMP:    Lab Results   Component Value Date    NA 145 04/15/2016    K 2.8 04/15/2016    CL 107 04/15/2016    CO2 21 04/15/2016    BUN 16 04/15/2016    LABALBU 3.6 04/15/2016    CREATININE 0.8 04/15/2016    CALCIUM 8.2 04/15/2016    GFRAA >60 04/15/2016    LABGLOM >60 04/15/2016    GLUCOSE 102 04/15/2016     Magnesium:    Lab Results   Component Value Date    MG 1.9 04/15/2016     Phosphorus:    Lab Results   Component Value Date    PHOS 4.0 04/14/2016     PT/INR:    Lab Results   Component Value Date    PROTIME 29.6 04/14/2016    INR 2.6 04/14/2016     PTT:     Lab Results   Component Value Date    APTT 55.8 04/06/2016   [APTT}     Assessment:    Patient Active Problem List   Diagnosis   ??? Moderate protein-calorie malnutrition (HCC)   ??? V-tach [facility](HCC)   ??? COPD (chronic obstructive pulmonary disease) (HCC)   ??? NSTEMI (non-ST elevated myocardial infarction) (HCC)   ??? CAD (coronary artery disease), native coronary artery   ??? Hyperlipidemia LDL goal <100   ??? PUD (peptic ulcer disease)   ??? Dementia   ???  Acquired hypothyroidism   ??? Hypokalemia   ??? Persistent atrial fibrillation (HCC)   ??? Esophageal ring   ??? Sepsis (HCC)   ??? UTI (urinary tract infection)   ??? Cellulitis   ??? Generalized abdominal pain   likely ischemic bowel    Plan:    Extensive discussion with daughter, grand daughter as well as dr Gustavus Bryantyarab who is nephew of the patient  They are leaning towards hospice but have not made final decision yet        Loyola MastSudhir K Kayin Osment  MD  2:44 PM  04/17/2016

## 2016-04-17 NOTE — Discharge Summary (Signed)
Physician Discharge Summary     Patient ID:  [name]  [id number]  82 y.o.  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  My involvement was for the last 2-3 days.  For details of previous hospital stay revealed the past progress note.  Patient was in poor health by the time I assumed care.  She did have multiple issues.  Including possible underlying occlusive coronary artery disease.  Recurrent V. Tach needing shocks.  Likely ischemic bowel.  In view of these extensive problems multiple discussions with patient's son, daughter, granddaughter, nephew who is a physician were held.  Eventually family requested hospice house discharge.  Patient was discharged there.    XR Chest Portable   Final Result   Stable pulmonary edema and effusions, unchanged from prior   exam.          XR Chest Portable   Final Result   Persistent CHF pattern with bibasilar hazy opacities suggesting   pleural effusions and/or atelectasis. A superimposed pneumonic   infiltrate would be difficult to exclude and should be considered in   the appropriate clinical context.      XR Chest Abdomen Ng Placement   Final Result   1. Tip of the feeding tube projecting in the left upper quadrant most   likely in the proximal jejunal loops.      CT ABDOMEN PELVIS W IV CONTRAST Additional Contrast? None   Final Result   1. Infiltrates and pleural effusion in the lung bases which may be due   to pneumonia or edema.      2. Cirrhosis of the liver with a large amount of ascites and question   of nonocclusive filling defect in the portal confluence which may    represent a thrombus without significant occlusion of the the portal   veins.      3. Large amount of ascites with somewhat thickened bowel loops which   may be due to ascites or enterocolitis.      4. Distended gallbladder with wall thickening which may be due to   ascites or cholecystitis.            XR Chest Portable   Final Result   1. Significant interval worsening of right pleural effusion.   2. Stable left effusion.   3. Worsening pulmonary edema.          US ABDOMINAL LIMITED   Final Result   1. Liver cirrhosis.   2. Lack of flow in the portal vein, possible thrombosis.   3. Ascites.      XR Chest Portable   Final Result   Findings/IMPRESSION: Interval placement of right internal jugular   central venous catheter terminating in the cavoatrial junction.   Otherwise unchanged exam. No pneumothorax.          XR Chest Portable   Final Result   1. Worsening bilateral effusions and edema.   2. New foreign bodies within the upper mediastinum can represent   interval trauma.          CT Head WO Contrast  Final Result      No acute intracranial findings.      Chronic microvascular ischemic changes and parenchymal volume loss.            XR Chest Portable   Final Result   Cardiomegaly with pulmonary edema and a small right pleural effusion.          Results for orders placed or performed during the hospital encounter of 04/03/16 (from the past 336 hour(s))   EKG 12 Lead    Collection Time: 04/10/16  7:11 AM   Result Value Ref Range    Ventricular Rate 100 BPM    Atrial Rate 100 BPM    P-R Interval 192 ms    QRS Duration 78 ms    Q-T Interval 360 ms    QTc Calculation (Bazett) 464 ms    P Axis 95 degrees    R Axis -12 degrees    T Axis 19 degrees   POCT Glucose    Collection Time: 04/10/16 11:29 PM   Result Value Ref Range    Meter Glucose 83 70 - 110 mg/dL   POCT Glucose    Collection Time: 04/11/16  4:56 AM   Result Value Ref Range    Meter Glucose 83 70 - 110 mg/dL   EKG 12 Lead    Collection Time: 04/11/16  5:28  AM   Result Value Ref Range    Ventricular Rate 120 BPM    Atrial Rate 108 BPM    QRS Duration 64 ms    Q-T Interval 336 ms    QTc Calculation (Bazett) 474 ms    R Axis -14 degrees    T Axis -8 degrees   Blood Gas, Arterial    Collection Time: 04/11/16  5:39 AM   Result Value Ref Range    Date Analyzed [id number]     Time Analyzed 0541     Source: Blood Arterial     pH, Blood Gas 7.529 (H) 7.350 - 7.450    PCO2 22.4 (L) 35.0 - 45.0 mmHg    PO2 73.1 60.0 - 100.0 mmHg    HCO3 18.3 (L) 22.0 - 26.0 mmol/L    B.E. -2.6 -3.0 - 3.0 mmol/L    O2 Sat 95.7 92.0 - 98.5 %    O2Hb 94.5 94.0 - 97.0 %    COHb 1.0 0.0 - 1.5 %    MetHb 0.3 0.0 - 1.5 %    O2 Content 17.4 mL/dL    HHb 4.2 0.0 - 5.0 %    tHb (est) 13.1 11.5 - 16.5 g/dL    Potassium 5.40 9.81 - 5.10 mmol/L    Mode NC2-  L     Date Of Collection [id number]     Time Collected 0539     Pt Temp 37.0 C    Operator ID [id number]     Lab [id number]     Critical(s) Notified . No Critical Values    CBC Auto Differential    Collection Time: 04/11/16  5:40 AM   Result Value Ref Range    WBC 8.1 4.5 - 11.5 E9/L    RBC 4.11 3.50 - 5.50 E12/L    Hemoglobin 12.4 11.5 - 15.5 g/dL    Hematocrit 08.6 57.8 - 48.0 %    MCV 90.0 80.0 - 99.9 fL    MCH 30.2 26.0 - 35.0 pg    MCHC 33.5 32.0 - 34.5 %    RDW 17.3 (H) 11.5 - 15.0 fL  Platelets 140 130 - 450 E9/L    MPV 10.3 7.0 - 12.0 fL    Neutrophils % 79.7 43.0 - 80.0 %    Immature Granulocytes % 0.6 0.0 - 5.0 %    Lymphocytes % 11.4 (L) 20.0 - 42.0 %    Monocytes % 8.1 2.0 - 12.0 %    Eosinophils % 0.0 0.0 - 6.0 %    Basophils % 0.2 0.0 - 2.0 %    Neutrophils # 6.45 1.80 - 7.30 E9/L    Immature Granulocytes # 0.05 E9/L    Lymphocytes # 0.92 (L) 1.50 - 4.00 E9/L    Monocytes # 0.66 0.10 - 0.95 E9/L    Eosinophils # 0.00 (L) 0.05 - 0.50 E9/L    Basophils # 0.02 0.00 - 0.20 E9/L   Comprehensive Metabolic Panel    Collection Time: 04/11/16  5:40 AM   Result Value Ref Range    Sodium 132 132 - 146 mmol/L    Potassium 4.5 3.5 - 5.0 mmol/L    Chloride 100 98 -  107 mmol/L    CO2 21 (L) 22 - 29 mmol/L    Anion Gap 11 7 - 16 mmol/L    Glucose 105 74 - 109 mg/dL    BUN 4 (L) 8 - 23 mg/dL    CREATININE 0.6 0.5 - 1.0 mg/dL    GFR Non-African American >60 >=60 mL/min/1.73    GFR African American >60     Calcium 8.1 (L) 8.6 - 10.2 mg/dL    Total Protein 5.0 (L) 6.4 - 8.3 g/dL    Alb 2.0 (L) 3.5 - 5.2 g/dL    Total Bilirubin 2.8 (H) 0.0 - 1.2 mg/dL    Alkaline Phosphatase 252 (H) 35 - 104 U/L    ALT 36 (H) 0 - 32 U/L    AST 66 (H) 0 - 31 U/L   Magnesium    Collection Time: 04/11/16  5:40 AM   Result Value Ref Range    Magnesium 1.6 1.6 - 2.6 mg/dL   Lactic Acid, Plasma    Collection Time: 04/11/16  5:40 AM   Result Value Ref Range    Lactic Acid 3.4 (H) 0.5 - 2.2 mmol/L   CK    Collection Time: 04/11/16  5:40 AM   Result Value Ref Range    Total CK 41 20 - 180 U/L   CK MB    Collection Time: 04/11/16  5:40 AM   Result Value Ref Range    CK-MB 2.6 0.0 - 4.3 ng/mL   Phosphorus    Collection Time: 04/11/16  5:40 AM   Result Value Ref Range    Phosphorus 1.8 (L) 2.5 - 4.5 mg/dL   Procalcitonin    Collection Time: 04/11/16  5:40 AM   Result Value Ref Range    Procalcitonin 0.26 (H) 0.00 - 0.08 ng/mL   TYPE AND SCREEN    Collection Time: 04/11/16  5:40 AM   Result Value Ref Range    ABO/Rh A NEG     Antibody Screen NEG    Urine Culture    Collection Time: 04/11/16  5:49 AM   Result Value Ref Range    Urine Culture, Routine <10,000 CFU/mL  Gram positive organism   (A)     Organism Pseudomonas aeruginosa (A)     Urine Culture, Routine >100,000 CFU/ml        Susceptibility    Pseudomonas aeruginosa - BACTERIAL SUSCEPTIBILITY PANEL BY MIC  cefepime =^8 Sensitive mcg/mL     gentamicin =^2 Sensitive mcg/mL     imipenem =^1 Sensitive mcg/mL     levofloxacin =^1 Sensitive mcg/mL     piperacillin-tazobactam =^8 Sensitive mcg/mL     tobramycin <=^1 Sensitive mcg/mL   Culture Stool    Collection Time: 04/11/16  6:00 AM   Result Value Ref Range    Culture, Stool       Salmonella, Shigella,  Campylobacter, Yersinia,  Aeromonas or E. Coli 0157:H7 not isolated     Clostridium difficile EIA    Collection Time: 04/11/16  6:00 AM   Result Value Ref Range    C difficile Toxin, EIA       Result: C Difficile Toxins A and B not detected  *  *  Normal Range: Not detected     LACTIC ACID, PLASMA    Collection Time: 04/11/16 12:00 PM   Result Value Ref Range    Lactic Acid 4.7 (HH) 0.5 - 2.2 mmol/L   Ammonia    Collection Time: 04/11/16 12:00 PM   Result Value Ref Range    Ammonia 33.0 11.0 - 51.0 umol/L   Strep Pneumoniae Antigen    Collection Time: 04/11/16  1:15 PM   Result Value Ref Range    STREP PNEUMONIAE ANTIGEN, URINE       Presumptive NEGATIVE for Pneumococcal pneumonia, suggesting  no current or recent pneumococcal infection. Infection due  to S. pneumoniae cannot be ruled out since the antigen present  in the sample may be below the detection limit of the test.     Legionella antigen, urine    Collection Time: 04/11/16  1:15 PM   Result Value Ref Range    L. pneumophila Serogp 1 Ur Ag       Presumptive NEGATIVE suggesting no recent or current infections  with Legionella pneumophilia serogroup 1. Infection to  Legionella cannot be ruled out since other serogroups and  species may cause infection, antigen may not be present in  early infection, or level of antigen may be below the  detection limit.     Urinalysis    Collection Time: 04/11/16  1:15 PM   Result Value Ref Range    Color, UA AMBER (A) Straw/Yellow    Clarity, UA CLOUDY (A) Clear    Glucose, Ur Negative Negative mg/dL    Bilirubin Urine SMALL (A) Negative    Ketones, Urine Negative Negative mg/dL    Specific Gravity, UA >=1.030 1.005 - 1.030    Blood, Urine LARGE (A) Negative    pH, UA 5.0 5.0 - 9.0    Protein, UA TRACE Negative mg/dL    Urobilinogen, Urine 0.2 <2.0 E.U./dL    Nitrite, Urine POSITIVE (A) Negative    Leukocyte Esterase, Urine TRACE (A) Negative   Microscopic Urinalysis    Collection Time: 04/11/16  1:15 PM   Result Value Ref  Range    WBC, UA 1-3 0 - 5 /HPF    RBC, UA 5-10 (A) 0 - 2 /HPF    Bacteria, UA MODERATE (A) /HPF   Respiratory Panel, Film Array    Collection Time: 04/11/16  4:30 PM   Result Value Ref Range    Film Array Adenovirus       Result: Not Detected  *  *  Normal Range: Not Detected      Film Array Coronavirus HKU1       Result: Not Detected  *  *  Normal Range: Not Detected  Film Array Conoravirus NL63       Result: Not Detected  *  *  Normal Range: Not Detected      Film Array Coronavirus 229E       Result: Not Detected  *  *  Normal Range: Not Detected      Film Array Coronavirus OC43       Result: Not Detected  *  *  Normal Range: Not Detected      Film Array Influenza A Virus       Result: Not Detected  *  *  Normal Range: Not Detected      Film Array Influenza A Virus H1       Result: Not Detected  *  *  Normal Range: Not Detected      Film Array Influenza A Virus 09H1       Result: Not Detected  *  *  Normal Range: Not Detected      Film Array Influenza A Virus H3       Result: Not Detected  *  *  Normal Range: Not Detected      Film Array Influenza B       Result: Not Detected  *  *  Normal Range: Not Detected      Film Array Metapneumovirus       Result: Not Detected  *  *  Normal Range: Not Detected      Film Array Parainfluenza Virus 1       Result: Not Detected  *  *  Normal Range: Not Detected      Film Array Parainfluenza Virus 2       Result: Not Detected  *  *  Normal Range: Not Detected      Film Array Parainfluenza Virus 3       Result: Not Detected  *  *  Normal Range: Not Detected      Film Array Parainfluenza Virus 4       Result: Not Detected  *  *  Normal Range: Not Detected      Film Array Respiratory Syncitial Virus       Result: Not Detected  *  *  Normal Range: Not Detected      Film Array Rhinovirus/Enterovirus       Result: Not Detected  *  *  Normal Range: Not Detected      Film Array Bordetella Pertusis       Result: Not Detected  *  *  Normal Range: Not Detected      Film Array  Chlamydophilia Pneumoniae       Result: Not Detected  *  *  Normal Range: Not Detected      Film Array Mycoplasma Pneumoniae       Result: Not Detected  *  *  Normal Range: Not Detected     Basic Metabolic Panel    Collection Time: 04/11/16  6:07 PM   Result Value Ref Range    Sodium 132 132 - 146 mmol/L    Potassium 4.0 3.5 - 5.0 mmol/L    Chloride 101 98 - 107 mmol/L    CO2 19 (L) 22 - 29 mmol/L    Anion Gap 12 7 - 16 mmol/L    Glucose 110 (H) 74 - 109 mg/dL    BUN 5 (L) 8 - 23 mg/dL    CREATININE 0.6 0.5 - 1.0 mg/dL    GFR Non-African American >60 >=60 mL/min/1.73  GFR African American >60     Calcium 7.4 (L) 8.6 - 10.2 mg/dL   LACTIC ACID, PLASMA    Collection Time: 04/11/16  6:08 PM   Result Value Ref Range    Lactic Acid 5.1 (HH) 0.5 - 2.2 mmol/L   Culture Blood #1    Collection Time: 04/11/16  9:15 PM   Result Value Ref Range    Blood Culture, Routine 5 Days- no growth    LACTIC ACID, PLASMA    Collection Time: 04/12/16 12:00 AM   Result Value Ref Range    Lactic Acid 5.5 (HH) 0.5 - 2.2 mmol/L   Basic metabolic panel    Collection Time: 04/12/16  3:00 AM   Result Value Ref Range    Sodium 132 132 - 146 mmol/L    Potassium 3.4 (L) 3.5 - 5.0 mmol/L    Chloride 102 98 - 107 mmol/L    CO2 18 (L) 22 - 29 mmol/L    Anion Gap 12 7 - 16 mmol/L    Glucose 98 74 - 109 mg/dL    BUN 6 (L) 8 - 23 mg/dL    CREATININE 0.6 0.5 - 1.0 mg/dL    GFR Non-African American >60 >=60 mL/min/1.73    GFR African American >60     Calcium 7.3 (L) 8.6 - 10.2 mg/dL   CBC auto differential    Collection Time: 04/12/16  3:00 AM   Result Value Ref Range    WBC 7.8 4.5 - 11.5 E9/L    RBC 3.44 (L) 3.50 - 5.50 E12/L    Hemoglobin 10.3 (L) 11.5 - 15.5 g/dL    Hematocrit 16.1 (L) 34.0 - 48.0 %    MCV 90.7 80.0 - 99.9 fL    MCH 29.9 26.0 - 35.0 pg    MCHC 33.0 32.0 - 34.5 %    RDW 17.2 (H) 11.5 - 15.0 fL    Platelets 94 (L) 130 - 450 E9/L    MPV 11.6 7.0 - 12.0 fL    Neutrophils % 87.7 (H) 43.0 - 80.0 %    Lymphocytes % 2.6 (L) 20.0 - 42.0 %     Monocytes % 9.6 2.0 - 12.0 %    Eosinophils % 0.0 0.0 - 6.0 %    Basophils % 0.1 0.0 - 2.0 %    Neutrophils # 6.86 1.80 - 7.30 E9/L    Lymphocytes # 0.23 (L) 1.50 - 4.00 E9/L    Monocytes # 0.78 0.10 - 0.95 E9/L    Eosinophils # 0.00 (L) 0.05 - 0.50 E9/L    Basophils # 0.00 0.00 - 0.20 E9/L    Anisocytosis 2+     Polychromasia 1+     Hypochromia 1+     Poikilocytes 2+     Schistocytes 1+     Burr Cells 2+     Ovalocytes 1+    Magnesium    Collection Time: 04/12/16  3:00 AM   Result Value Ref Range    Magnesium 1.7 1.6 - 2.6 mg/dL   Phosphorus    Collection Time: 04/12/16  3:00 AM   Result Value Ref Range    Phosphorus 3.2 2.5 - 4.5 mg/dL   LACTIC ACID, PLASMA    Collection Time: 04/12/16  3:00 AM   Result Value Ref Range    Lactic Acid 4.8 (HH) 0.5 - 2.2 mmol/L   Platelet Confirmation    Collection Time: 04/12/16  3:00 AM   Result Value Ref Range  Platelet Confirmation CONFIRMED    Blood Gas, Arterial    Collection Time: 04/12/16  4:41 AM   Result Value Ref Range    Date Analyzed 16109604     Time Analyzed 0443     Source: Blood Arterial     pH, Blood Gas 7.405 7.350 - 7.450    PCO2 27.2 (L) 35.0 - 45.0 mmHg    PO2 74.5 60.0 - 100.0 mmHg    HCO3 16.7 (L) 22.0 - 26.0 mmol/L    B.E. -6.8 (L) -3.0 - 3.0 mmol/L    O2 Sat 94.4 92.0 - 98.5 %    O2Hb 93.6 (L) 94.0 - 97.0 %    COHb 0.4 0.0 - 1.5 %    MetHb 0.4 0.0 - 1.5 %    O2 Content 14.8 mL/dL    HHb 5.6 (H) 0.0 - 5.0 %    tHb (est) 11.2 (L) 11.5 - 16.5 g/dL    Mode NC-5  L     Date Of Collection [id number]     Time Collected 0441     Pt Temp 37.0 C    Operator ID [id number]     Lab [id number]     Critical(s) Notified . No Critical Values    EKG 12 Lead    Collection Time: 04/12/16  6:25 AM   Result Value Ref Range    Ventricular Rate 81 BPM    Atrial Rate 53 BPM    QRS Duration 72 ms    Q-T Interval 510 ms    QTc Calculation (Bazett) 592 ms    R Axis -6 degrees    T Axis 15 degrees   Antistreptolysin O titer    Collection Time: 04/12/16  9:00 AM   Result Value Ref Range    ASO  32 0 - 200 IU/mL   LACTIC ACID, PLASMA    Collection Time: 04/12/16 11:55 AM   Result Value Ref Range    Lactic Acid 4.2 (HH) 0.5 - 2.2 mmol/L   Blood Gas, Arterial    Collection Time: 04/12/16  5:49 PM   Result Value Ref Range    Date Analyzed [id number]     Time Analyzed 1750     Source: Blood Arterial     pH, Blood Gas 7.409 7.350 - 7.450    PCO2 25.8 (L) 35.0 - 45.0 mmHg    PO2 68.9 60.0 - 100.0 mmHg    HCO3 15.9 (L) 22.0 - 26.0 mmol/L    B.E. -7.2 (L) -3.0 - 3.0 mmol/L    O2 Sat 93.9 92.0 - 98.5 %    O2Hb 93.2 (L) 94.0 - 97.0 %    COHb 0.3 0.0 - 1.5 %    MetHb 0.4 0.0 - 1.5 %    O2 Content 15.8 mL/dL    HHb 6.1 (H) 0.0 - 5.0 %    tHb (est) 12.0 11.5 - 16.5 g/dL    Mode NC-  4lpm     Date Of Collection [id number]     Time Collected 1749     Pt Temp 37.0 C    Operator ID [id number]     Lab [id number]     Critical(s) Notified . No Critical Values    LACTIC ACID, PLASMA    Collection Time: 04/12/16  5:55 PM   Result Value Ref Range    Lactic Acid 3.9 (H) 0.5 - 2.2 mmol/L   Hepatic function panel    Collection Time: 04/12/16  5:55 PM   Result Value  Ref Range    Total Protein 4.0 (L) 6.4 - 8.3 g/dL    Alb 1.7 (L) 3.5 - 5.2 g/dL    Alkaline Phosphatase 167 (H) 35 - 104 U/L    ALT 33 (H) 0 - 32 U/L    AST 57 (H) 0 - 31 U/L    Total Bilirubin 2.7 (H) 0.0 - 1.2 mg/dL    Bilirubin, Direct 1.6 (H) 0.0 - 0.3 mg/dL    Bilirubin, Indirect 1.1 (H) 0.0 - 1.0 mg/dL   LACTIC ACID, PLASMA    Collection Time: 04/13/16 12:05 AM   Result Value Ref Range    Lactic Acid 4.4 (HH) 0.5 - 2.2 mmol/L   CBC auto differential    Collection Time: 04/13/16  4:20 AM   Result Value Ref Range    WBC 23.6 (H) 4.5 - 11.5 E9/L    RBC 3.27 (L) 3.50 - 5.50 E12/L    Hemoglobin 9.8 (L) 11.5 - 15.5 g/dL    Hematocrit 16.1 (L) 34.0 - 48.0 %    MCV 90.5 80.0 - 99.9 fL    MCH 30.0 26.0 - 35.0 pg    MCHC 33.1 32.0 - 34.5 %    RDW 17.7 (H) 11.5 - 15.0 fL    Platelets 87 (L) 130 - 450 E9/L    MPV 12.4 (H) 7.0 - 12.0 fL    Neutrophils % 85.2 (H) 43.0 - 80.0 %    Immature  Granulocytes % 1.8 0.0 - 5.0 %    Lymphocytes % 8.7 (L) 20.0 - 42.0 %    Monocytes % 4.2 2.0 - 12.0 %    Eosinophils % 0.0 0.0 - 6.0 %    Basophils % 0.1 0.0 - 2.0 %    Neutrophils # 20.07 (H) 1.80 - 7.30 E9/L    Immature Granulocytes # 0.42 E9/L    Lymphocytes # 2.05 1.50 - 4.00 E9/L    Monocytes # 0.98 (H) 0.10 - 0.95 E9/L    Eosinophils # 0.00 (L) 0.05 - 0.50 E9/L    Basophils # 0.03 0.00 - 0.20 E9/L    Anisocytosis 2+     Polychromasia 1+     Poikilocytes 2+     Burr Cells 2+     Ovalocytes 1+    Magnesium    Collection Time: 04/13/16  4:20 AM   Result Value Ref Range    Magnesium 2.1 1.6 - 2.6 mg/dL   Phosphorus    Collection Time: 04/13/16  4:20 AM   Result Value Ref Range    Phosphorus 3.5 2.5 - 4.5 mg/dL   LACTIC ACID, PLASMA    Collection Time: 04/13/16  4:20 AM   Result Value Ref Range    Lactic Acid 5.0 (HH) 0.5 - 2.2 mmol/L   Comprehensive Metabolic Panel    Collection Time: 04/13/16  4:20 AM   Result Value Ref Range    Sodium 137 132 - 146 mmol/L    Potassium 3.9 3.5 - 5.0 mmol/L    Chloride 104 98 - 107 mmol/L    CO2 17 (L) 22 - 29 mmol/L    Anion Gap 16 7 - 16 mmol/L    Glucose 40 (L) 74 - 109 mg/dL    BUN 8 8 - 23 mg/dL    CREATININE 0.7 0.5 - 1.0 mg/dL    GFR Non-African American >60 >=60 mL/min/1.73    GFR African American >60     Calcium 7.5 (L) 8.6 - 10.2 mg/dL  Total Protein 3.9 (L) 6.4 - 8.3 g/dL    Alb 1.6 (L) 3.5 - 5.2 g/dL    Total Bilirubin 3.1 (H) 0.0 - 1.2 mg/dL    Alkaline Phosphatase 176 (H) 35 - 104 U/L    ALT 33 (H) 0 - 32 U/L    AST 62 (H) 0 - 31 U/L   Protime-INR    Collection Time: 04/13/16  4:20 AM   Result Value Ref Range    Protime 32.0 (H) 9.3 - 12.4 sec    INR 2.8    Platelet Confirmation    Collection Time: 04/13/16  4:20 AM   Result Value Ref Range    Platelet Confirmation CONFIRMED    POCT Glucose    Collection Time: 04/13/16  7:08 AM   Result Value Ref Range    Meter Glucose 72 70 - 110 mg/dL   LACTIC ACID, PLASMA    Collection Time: 04/13/16  1:30 PM   Result Value Ref  Range    Lactic Acid 4.4 (HH) 0.5 - 2.2 mmol/L   LACTIC ACID, PLASMA    Collection Time: 04/13/16 11:00 PM   Result Value Ref Range    Lactic Acid 3.1 (H) 0.5 - 2.2 mmol/L   Vancomycin, trough    Collection Time: 04/13/16 11:00 PM   Result Value Ref Range    Vancomycin Tr 20.0 (H) 5.0 - 16.0 mcg/mL   LACTIC ACID, PLASMA    Collection Time: 04/14/16  5:00 AM   Result Value Ref Range    Lactic Acid 2.8 (H) 0.5 - 2.2 mmol/L   CBC auto differential    Collection Time: 04/14/16  5:00 AM   Result Value Ref Range    WBC 20.0 (H) 4.5 - 11.5 E9/L    RBC 2.80 (L) 3.50 - 5.50 E12/L    Hemoglobin 8.4 (L) 11.5 - 15.5 g/dL    Hematocrit 16.1 (L) 34.0 - 48.0 %    MCV 92.9 80.0 - 99.9 fL    MCH 30.0 26.0 - 35.0 pg    MCHC 32.3 32.0 - 34.5 %    RDW 18.0 (H) 11.5 - 15.0 fL    Platelets 64 (L) 130 - 450 E9/L    MPV 11.8 7.0 - 12.0 fL    Neutrophils % 80.5 (H) 43.0 - 80.0 %    Immature Granulocytes % 0.5 0.0 - 5.0 %    Lymphocytes % 11.8 (L) 20.0 - 42.0 %    Monocytes % 7.2 2.0 - 12.0 %    Eosinophils % 0.0 0.0 - 6.0 %    Basophils % 0.0 0.0 - 2.0 %    Neutrophils # 16.07 (H) 1.80 - 7.30 E9/L    Immature Granulocytes # 0.11 E9/L    Lymphocytes # 2.37 1.50 - 4.00 E9/L    Monocytes # 1.45 (H) 0.10 - 0.95 E9/L    Eosinophils # 0.00 (L) 0.05 - 0.50 E9/L    Basophils # 0.01 0.00 - 0.20 E9/L    Anisocytosis 2+     Polychromasia 1+     Hypochromia 1+     Poikilocytes 1+     Burr Cells 1+     Ovalocytes 1+     Target Cells 1+    Magnesium    Collection Time: 04/14/16  5:00 AM   Result Value Ref Range    Magnesium 2.0 1.6 - 2.6 mg/dL   Phosphorus    Collection Time: 04/14/16  5:00 AM   Result Value Ref Range  Phosphorus 4.0 2.5 - 4.5 mg/dL   Comprehensive Metabolic Panel    Collection Time: 04/14/16  5:00 AM   Result Value Ref Range    Sodium 140 132 - 146 mmol/L    Potassium 3.6 3.5 - 5.0 mmol/L    Chloride 105 98 - 107 mmol/L    CO2 19 (L) 22 - 29 mmol/L    Anion Gap 16 7 - 16 mmol/L    Glucose 61 (L) 74 - 109 mg/dL    BUN 13 8 - 23 mg/dL     CREATININE 0.8 0.5 - 1.0 mg/dL    GFR Non-African American >60 >=60 mL/min/1.73    GFR African American >60     Calcium 8.1 (L) 8.6 - 10.2 mg/dL    Total Protein 4.7 (L) 6.4 - 8.3 g/dL    Alb 2.9 (L) 3.5 - 5.2 g/dL    Total Bilirubin 4.3 (H) 0.0 - 1.2 mg/dL    Alkaline Phosphatase 162 (H) 35 - 104 U/L    ALT 27 0 - 32 U/L    AST 50 (H) 0 - 31 U/L   Protime-INR    Collection Time: 04/14/16  5:00 AM   Result Value Ref Range    Protime 29.6 (H) 9.3 - 12.4 sec    INR 2.6    Platelet Confirmation    Collection Time: 04/14/16  5:00 AM   Result Value Ref Range    Platelet Confirmation CONFIRMED    EKG 12 Lead    Collection Time: 04/14/16  6:35 AM   Result Value Ref Range    Ventricular Rate 92 BPM    Atrial Rate 92 BPM    P-R Interval 190 ms    QRS Duration 88 ms    Q-T Interval 348 ms    QTc Calculation (Bazett) 430 ms    R Axis 4 degrees    T Axis 61 degrees   LACTIC ACID, PLASMA    Collection Time: 04/14/16  8:05 PM   Result Value Ref Range    Lactic Acid 3.9 (H) 0.5 - 2.2 mmol/L   CBC auto differential    Collection Time: 04/15/16  5:40 AM   Result Value Ref Range    WBC 12.7 (H) 4.5 - 11.5 E9/L    RBC 2.51 (L) 3.50 - 5.50 E12/L    Hemoglobin 7.4 (L) 11.5 - 15.5 g/dL    Hematocrit 16.1 (L) 34.0 - 48.0 %    MCV 92.0 80.0 - 99.9 fL    MCH 29.5 26.0 - 35.0 pg    MCHC 32.0 32.0 - 34.5 %    RDW 18.0 (H) 11.5 - 15.0 fL    Platelets 51 (L) 130 - 450 E9/L    MPV 12.3 (H) 7.0 - 12.0 fL    Neutrophils % 89.0 (H) 43.0 - 80.0 %    Lymphocytes % 4.0 (L) 20.0 - 42.0 %    Monocytes % 6.0 2.0 - 12.0 %    Eosinophils % 1.0 0.0 - 6.0 %    Basophils % 0.0 0.0 - 2.0 %    Neutrophils # 11.30 (H) 1.80 - 7.30 E9/L    Lymphocytes # 0.51 (L) 1.50 - 4.00 E9/L    Monocytes # 0.76 0.10 - 0.95 E9/L    Eosinophils # 0.13 0.05 - 0.50 E9/L    Basophils # 0.00 0.00 - 0.20 E9/L    nRBC 1.0 /100 WBC    Anisocytosis 1+     Polychromasia 1+  Hypochromia 1+     Poikilocytes 1+     Ovalocytes 1+     Target Cells 1+    Comprehensive Metabolic Panel     Collection Time: 04/15/16  5:40 AM   Result Value Ref Range    Sodium 145 132 - 146 mmol/L    Potassium 2.8 (L) 3.5 - 5.0 mmol/L    Chloride 107 98 - 107 mmol/L    CO2 21 (L) 22 - 29 mmol/L    Anion Gap 17 (H) 7 - 16 mmol/L    Glucose 102 74 - 109 mg/dL    BUN 16 8 - 23 mg/dL    CREATININE 0.8 0.5 - 1.0 mg/dL    GFR Non-African American >60 >=60 mL/min/1.73    GFR African American >60     Calcium 8.2 (L) 8.6 - 10.2 mg/dL    Total Protein 5.1 (L) 6.4 - 8.3 g/dL    Alb 3.6 3.5 - 5.2 g/dL    Total Bilirubin 4.0 (H) 0.0 - 1.2 mg/dL    Alkaline Phosphatase 188 (H) 35 - 104 U/L    ALT 26 0 - 32 U/L    AST 46 (H) 0 - 31 U/L   Magnesium    Collection Time: 04/15/16  5:40 AM   Result Value Ref Range    Magnesium 1.9 1.6 - 2.6 mg/dL   Platelet Confirmation    Collection Time: 04/15/16  5:40 AM   Result Value Ref Range    Platelet Confirmation CONFIRMED    EKG 12 Lead    Collection Time: 04/15/16  6:03 AM   Result Value Ref Range    Ventricular Rate 100 BPM    Atrial Rate 100 BPM    P-R Interval 186 ms    QRS Duration 90 ms    Q-T Interval 308 ms    QTc Calculation (Bazett) 397 ms    P Axis 132 degrees    R Axis 11 degrees    T Axis 180 degrees   EKG 12 Lead    Collection Time: 04/16/16  5:58 AM   Result Value Ref Range    Ventricular Rate 104 BPM    Atrial Rate 98 BPM    QRS Duration 80 ms    Q-T Interval 302 ms    QTc Calculation (Bazett) 397 ms    R Axis -6 degrees    T Axis -143 degrees   EKG 12 Lead    Collection Time: 04/17/16  5:50 AM   Result Value Ref Range    Ventricular Rate 103 BPM    Atrial Rate 113 BPM    QRS Duration 68 ms    Q-T Interval 304 ms    QTc Calculation (Bazett) 398 ms    R Axis -8 degrees    T Axis -153 degrees         Discharge Exam:  See progress note from today    Disposition: hospice house    Patient Instructions:   Discharge Medication List as of 04/17/2016  3:53 PM      CONTINUE these medications which have NOT CHANGED    Details   ondansetron (ZOFRAN) 4 MG tablet Take 1 tablet by mouth every 8  hours as needed for Nausea or Vomiting, Disp-30 tablet, R-0Print      albuterol sulfate HFA 108 (90 BASE) MCG/ACT inhaler Inhale 2 puffs into the lungs every 6 hours as needed for Wheezing (last took this am)      omeprazole (PRILOSEC)  40 MG delayed release capsule Take 1 capsule by mouth daily, Disp-30 capsule, R-3      MULTIPLE VITAMIN PO Take by mouth daily      buPROPion (WELLBUTRIN SR) 150 MG extended release tablet Take 150 mg by mouth nightly      donepezil (ARICEPT) 10 MG tablet Take 10 mg by mouth nightly      simvastatin (ZOCOR) 20 MG tablet Take 20 mg by mouth nightly.        levothyroxine (SYNTHROID) 50 MCG tablet Take 50 mcg by mouth daily.        citalopram (CELEXA) 20 MG tablet Take 20 mg by mouth daily Instructed to take with sip water am of procedure      montelukast (SINGULAIR) 10 MG tablet Take 10 mg by mouth nightly.        raloxifene (EVISTA) 60 MG tablet Take 60 mg by mouth daily.        pregabalin (LYRICA) 150 MG capsule Take 150 mg by mouth 2 times daily Instructed to take with sip water am of procedure      digoxin (LANOXIN) 0.125 MG tablet Take 125 mcg by mouth every 48 hours Takes at night every other day      dabigatran (PRADAXA) 150 MG capsule Take 150 mg by mouth 2 times daily.        ipratropium-albuterol (DUONEB) 0.5-2.5 (3) MG/3ML SOLN nebulizer solution Inhale 1 vial into the lungs every 4 hours Uses prn / instructed if needs to use dos               Signed:  Loyola MastSudhir K Finis Hendricksen  04/23/2016  5:46 PM

## 2016-04-18 LAB — EKG 12-LEAD
Atrial Rate: 113 {beats}/min
Q-T Interval: 304 ms
QRS Duration: 68 ms
QTc Calculation (Bazett): 398 ms
R Axis: -8 degrees
T Axis: -153 degrees
Ventricular Rate: 103 {beats}/min

## 2016-04-18 MED FILL — IPRATROPIUM-ALBUTEROL 0.5-2.5 (3) MG/3ML IN SOLN: RESPIRATORY_TRACT | Qty: 3

## 2016-04-18 MED FILL — NORMAL SALINE FLUSH 0.9 % IV SOLN: 0.9 % | INTRAVENOUS | Qty: 20

## 2016-04-18 MED FILL — GLYCOPYRROLATE 0.2 MG/ML IJ SOLN: 0.2 MG/ML | INTRAMUSCULAR | Qty: 2

## 2016-04-18 MED FILL — LORAZEPAM 2 MG/ML IJ SOLN: 2 MG/ML | INTRAMUSCULAR | Qty: 1

## 2016-04-18 MED FILL — MORPHINE SULFATE 2 MG/ML IJ SOLN: 2 MG/ML | INTRAMUSCULAR | Qty: 1

## 2016-04-18 MED FILL — NORMAL SALINE FLUSH 0.9 % IV SOLN: 0.9 % | INTRAVENOUS | Qty: 40

## 2016-04-19 MED FILL — LORAZEPAM 2 MG/ML IJ SOLN: 2 MG/ML | INTRAMUSCULAR | Qty: 1

## 2016-04-19 MED FILL — IPRATROPIUM-ALBUTEROL 0.5-2.5 (3) MG/3ML IN SOLN: RESPIRATORY_TRACT | Qty: 3

## 2016-04-19 MED FILL — NORMAL SALINE FLUSH 0.9 % IV SOLN: 0.9 % | INTRAVENOUS | Qty: 10

## 2016-04-19 MED FILL — MORPHINE SULFATE 2 MG/ML IJ SOLN: 2 MG/ML | INTRAMUSCULAR | Qty: 1

## 2016-04-19 MED FILL — GLYCOPYRROLATE 0.2 MG/ML IJ SOLN: 0.2 MG/ML | INTRAMUSCULAR | Qty: 2

## 2016-04-19 MED FILL — NORMAL SALINE FLUSH 0.9 % IV SOLN: 0.9 % | INTRAVENOUS | Qty: 20

## 2016-04-26 DEATH — deceased

## 2016-06-27 IMAGING — DX DG KNEE COMPLETE 4+V*R*
4 series · 4 of 4 positions shown · non-contrast
Comparison: None.

CLINICAL DATA: Slipped and fell yesterday. Injured right knee and
left arm.

EXAM:
RIGHT KNEE - COMPLETE 4+ VIEW; LEFT HUMERUS - 2+ VIEW

[knee ap]
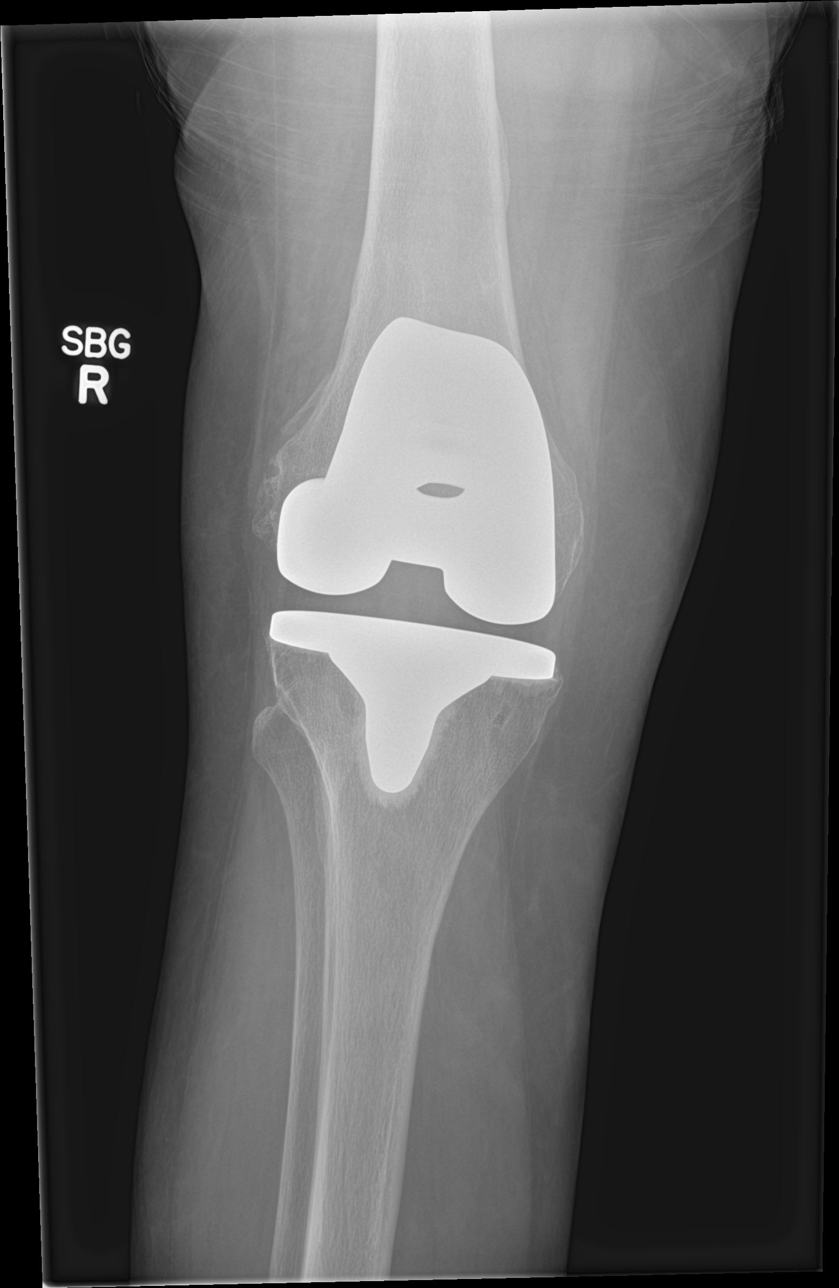

[knee obl (1 of 2)]
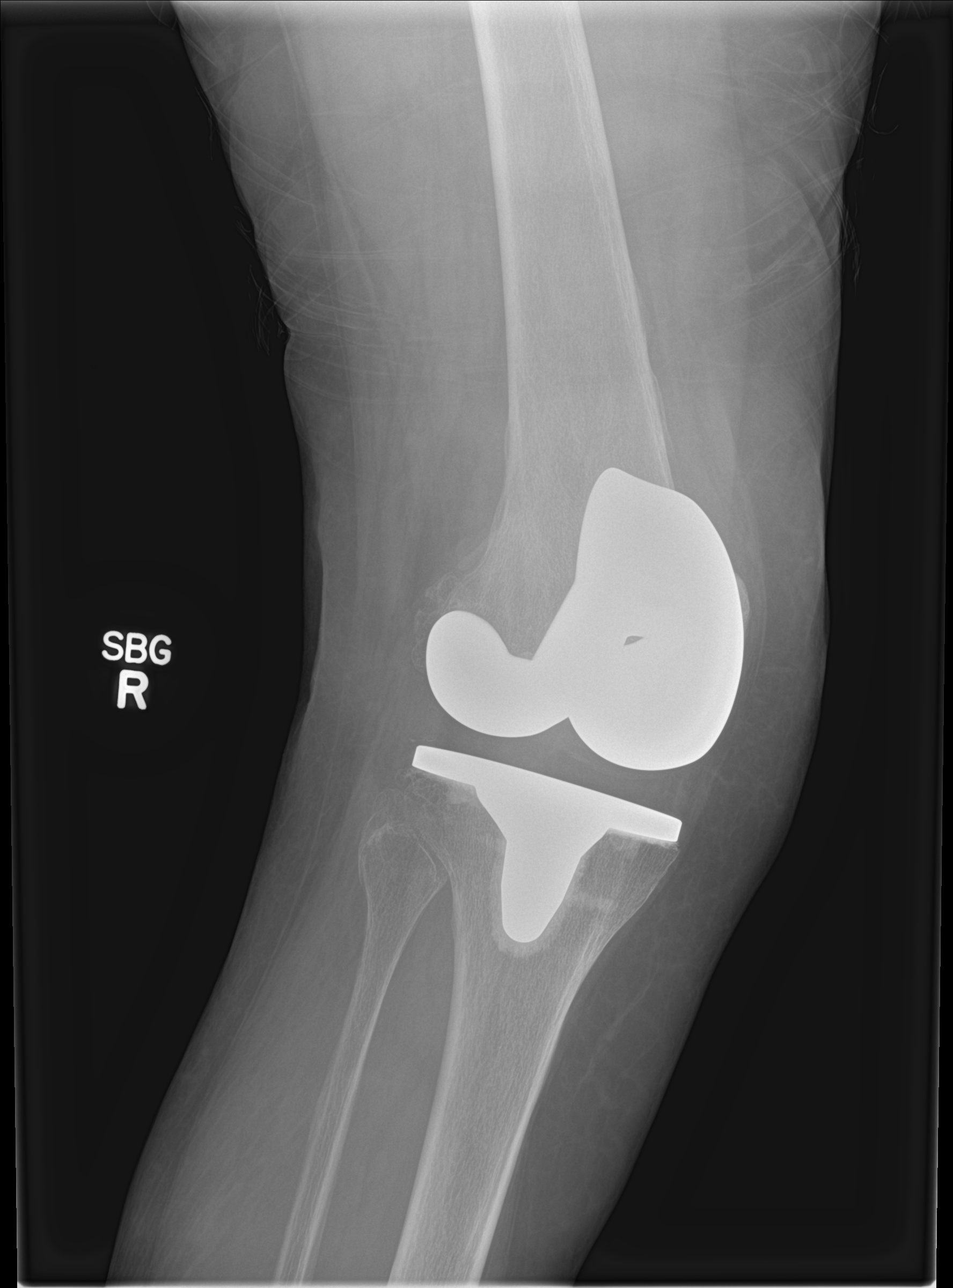

[knee obl (2 of 2)]
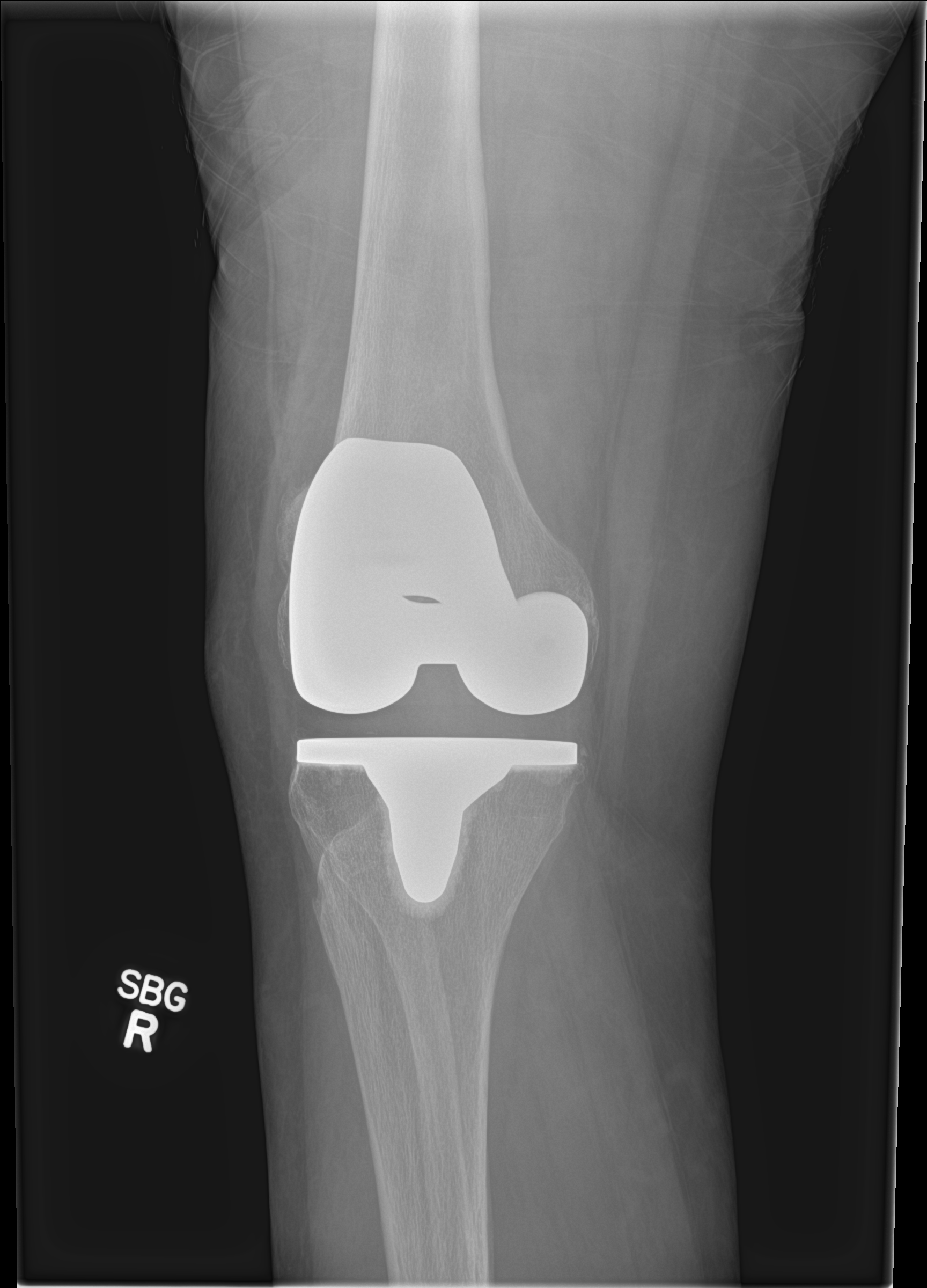

[knee lat]
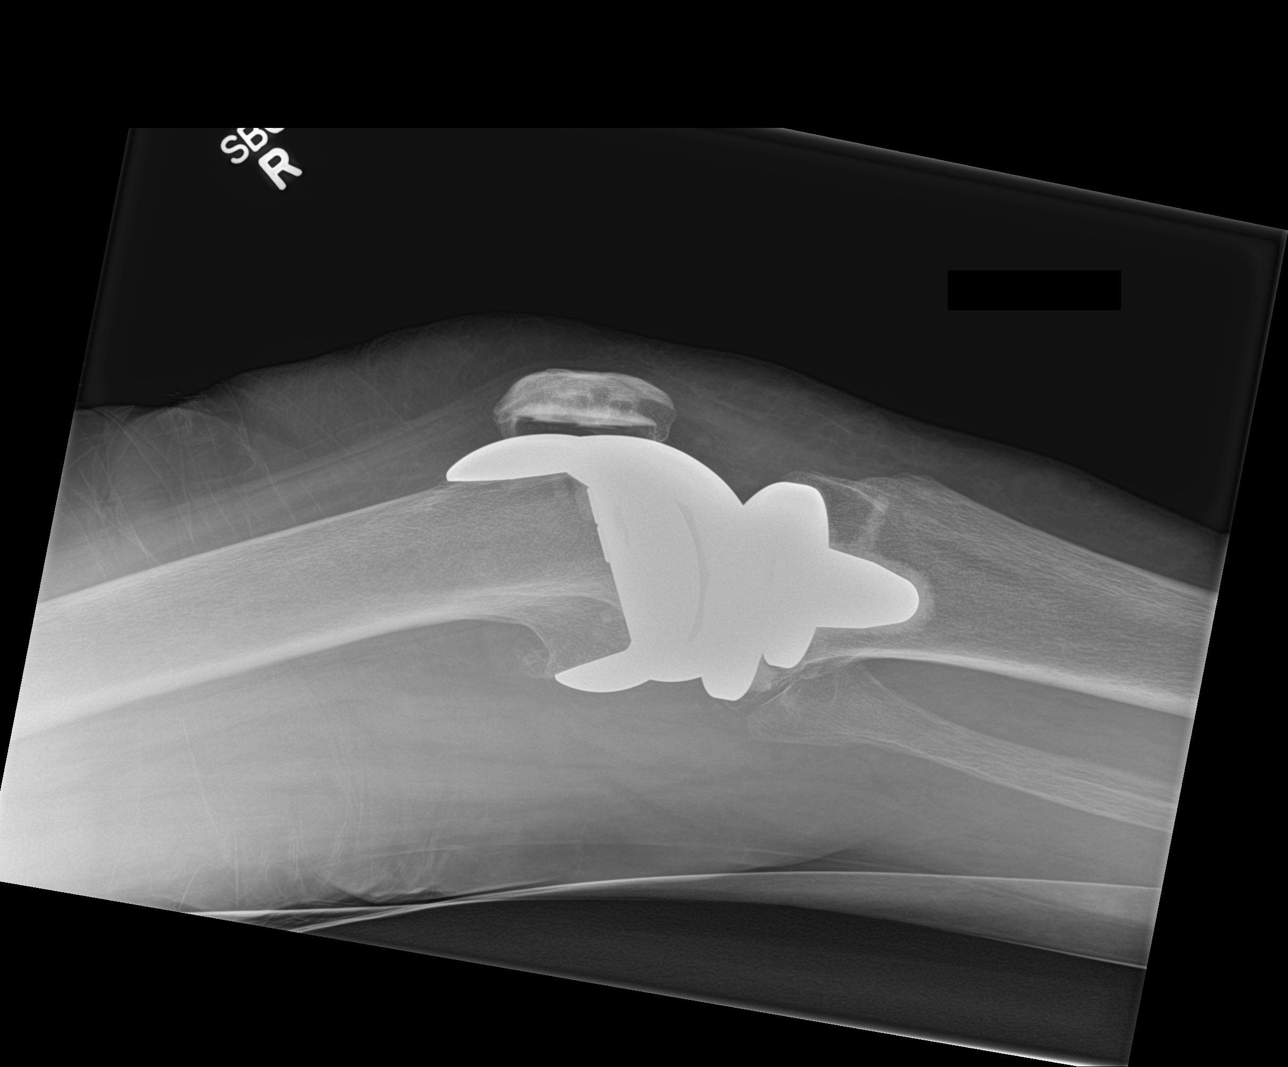

[4 of 4 positions shown; findings below may reference images not displayed]

FINDINGS: Right knee:

The femoral and tibial components are well seated. No complicating
features. No acute fracture. No joint effusion.

Left humerus:

The left shoulder and elbow joints are grossly maintained. No acute
fracture of the humerus is identified.
IMPRESSION: No acute bony findings.
